# Patient Record
Sex: Male | Born: 1935 | Race: White | Hispanic: No | Marital: Single | Smoking: Never smoker
Health system: Southern US, Community
[De-identification: ages and names within clinical notes are randomized; demographics above are authoritative.]

## PROBLEM LIST (undated history)

## (undated) DIAGNOSIS — I10 Essential (primary) hypertension: Secondary | ICD-10-CM

## (undated) DIAGNOSIS — E119 Type 2 diabetes mellitus without complications: Secondary | ICD-10-CM

## (undated) HISTORY — PX: APPENDECTOMY: SHX54

---

## 2016-10-04 ENCOUNTER — Emergency Department
Admission: EM | Admit: 2016-10-04 | Discharge: 2016-10-04 | Disposition: A | Discharge status: Home / Self Care | Payer: Medicare Other | Attending: Emergency Medicine | Admitting: Emergency Medicine

## 2016-10-04 ENCOUNTER — Emergency Department: Payer: Medicare Other

## 2016-10-04 ENCOUNTER — Encounter: Payer: Self-pay | Admitting: Emergency Medicine

## 2016-10-04 DIAGNOSIS — Z7982 Long term (current) use of aspirin: Secondary | ICD-10-CM | POA: Diagnosis not present

## 2016-10-04 DIAGNOSIS — Z950 Presence of cardiac pacemaker: Secondary | ICD-10-CM | POA: Diagnosis not present

## 2016-10-04 DIAGNOSIS — I2510 Atherosclerotic heart disease of native coronary artery without angina pectoris: Secondary | ICD-10-CM | POA: Diagnosis not present

## 2016-10-04 DIAGNOSIS — F0390 Unspecified dementia without behavioral disturbance: Secondary | ICD-10-CM | POA: Insufficient documentation

## 2016-10-04 DIAGNOSIS — R410 Disorientation, unspecified: Secondary | ICD-10-CM | POA: Insufficient documentation

## 2016-10-04 DIAGNOSIS — E1165 Type 2 diabetes mellitus with hyperglycemia: Secondary | ICD-10-CM | POA: Insufficient documentation

## 2016-10-04 DIAGNOSIS — R4182 Altered mental status, unspecified: Secondary | ICD-10-CM | POA: Diagnosis present

## 2016-10-04 DIAGNOSIS — R739 Hyperglycemia, unspecified: Secondary | ICD-10-CM

## 2016-10-04 DIAGNOSIS — Z79899 Other long term (current) drug therapy: Secondary | ICD-10-CM | POA: Insufficient documentation

## 2016-10-04 DIAGNOSIS — E876 Hypokalemia: Secondary | ICD-10-CM

## 2016-10-04 HISTORY — DX: Type 2 diabetes mellitus without complications: E11.9

## 2016-10-04 HISTORY — DX: Essential (primary) hypertension: I10

## 2016-10-04 LAB — COMPREHENSIVE METABOLIC PANEL
ALT: 18 U/L (ref 17–63)
ANION GAP: 7 (ref 5–15)
AST: 21 U/L (ref 15–41)
Albumin: 3.7 g/dL (ref 3.5–5.0)
Alkaline Phosphatase: 69 U/L (ref 38–126)
BUN: 17 mg/dL (ref 6–20)
CHLORIDE: 98 mmol/L — AB (ref 101–111)
CO2: 25 mmol/L (ref 22–32)
CREATININE: 1.39 mg/dL — AB (ref 0.61–1.24)
Calcium: 8.4 mg/dL — ABNORMAL LOW (ref 8.9–10.3)
GFR calc non Af Amer: 46 mL/min — ABNORMAL LOW (ref >60)
GFR, EST AFRICAN AMERICAN: 54 mL/min — AB (ref >60)
Glucose, Bld: 318 mg/dL — ABNORMAL HIGH (ref 65–99)
POTASSIUM: 3.2 mmol/L — AB (ref 3.5–5.1)
SODIUM: 130 mmol/L — AB (ref 135–145)
Total Bilirubin: 1.1 mg/dL (ref 0.3–1.2)
Total Protein: 6.4 g/dL — ABNORMAL LOW (ref 6.5–8.1)

## 2016-10-04 LAB — CBC WITH DIFFERENTIAL/PLATELET
Basophils Absolute: 0.1 10*3/uL (ref 0–0.1)
Basophils Relative: 1 %
EOS ABS: 0.2 10*3/uL (ref 0–0.7)
EOS PCT: 2 %
HCT: 33.5 % — ABNORMAL LOW (ref 40.0–52.0)
Hemoglobin: 11.8 g/dL — ABNORMAL LOW (ref 13.0–18.0)
LYMPHS ABS: 0.6 10*3/uL — AB (ref 1.0–3.6)
Lymphocytes Relative: 6 %
MCH: 30.4 pg (ref 26.0–34.0)
MCHC: 35.3 g/dL (ref 32.0–36.0)
MCV: 86.1 fL (ref 80.0–100.0)
MONO ABS: 0.6 10*3/uL (ref 0.2–1.0)
Monocytes Relative: 6 %
Neutro Abs: 8.4 10*3/uL — ABNORMAL HIGH (ref 1.4–6.5)
Neutrophils Relative %: 85 %
PLATELETS: 150 10*3/uL (ref 150–440)
RBC: 3.89 MIL/uL — AB (ref 4.40–5.90)
RDW: 13.9 % (ref 11.5–14.5)
WBC: 9.9 10*3/uL (ref 3.8–10.6)

## 2016-10-04 LAB — URINALYSIS, COMPLETE (UACMP) WITH MICROSCOPIC
BILIRUBIN URINE: NEGATIVE
Bacteria, UA: NONE SEEN
Glucose, UA: >1000 mg/dL — AB
LEUKOCYTES UA: NEGATIVE
NITRITE: NEGATIVE
RBC / HPF: NONE SEEN RBC/hpf (ref 0–5)
SQUAMOUS EPITHELIAL / LPF: NONE SEEN
Specific Gravity, Urine: 1.015 (ref 1.005–1.030)
pH: 5.5 (ref 5.0–8.0)

## 2016-10-04 LAB — TROPONIN I
TROPONIN I: 0.04 ng/mL — AB (ref <0.03)
TROPONIN I: 0.05 ng/mL — AB (ref <0.03)

## 2016-10-04 LAB — GLUCOSE, CAPILLARY: Glucose-Capillary: 273 mg/dL — ABNORMAL HIGH (ref 65–99)

## 2016-10-04 MED ORDER — POTASSIUM CHLORIDE CRYS ER 20 MEQ PO TBCR
40.0000 meq | EXTENDED_RELEASE_TABLET | Freq: Once | ORAL | Status: AC
  Administered 2016-10-04: 40 meq via ORAL
  Filled 2016-10-04: qty 2

## 2016-10-04 MED ORDER — SODIUM CHLORIDE 0.9 % IV BOLUS (SEPSIS)
500.0000 mL | Freq: Once | INTRAVENOUS | Status: AC
  Administered 2016-10-04: 500 mL via INTRAVENOUS

## 2016-10-04 NOTE — ED Triage Notes (Addendum)
Per brother pt more confused than normal this am when he woke up. Pt with dementia, alert to self and place. Pt denies any feelings of confusion. Brother states has been urinating very frequently. No slurred speech, no facial droop noted, equal hand grips. Confusion noted upon waking around 6am.

## 2016-10-04 NOTE — ED Notes (Signed)
Pt resting in bed, family at bedside, warm blanket given 

## 2016-10-04 NOTE — ED Notes (Signed)
Dr. Veronese notified of critical trop 

## 2016-10-04 NOTE — ED Notes (Signed)
Pt resting in bed, denies any needs, resp even and unlabored 

## 2016-10-04 NOTE — ED Provider Notes (Signed)
[facility] Emergency Department Provider Note  ____________________________________________  Time seen: Approximately 11:02 AM  I have reviewed the triage vital signs and the nursing notes.   HISTORY  Chief Complaint Altered Mental Status   HPI Jesus [name] is a 82 y.o. male with a history of dementia, diabetes, hypertension, coronary artery disease, pacemaker who presents for evaluation of altered mental status. Patient is here with his brother who said the patient was a little bit more confused than normal this morning. He was rambling about [place] and vegetables this morning. Had a little bit of trouble getting up from the couch. Patient has a history of dementia but this was a little bit worse than his baseline. No facial droop, difficulty finding words, unilateral weakness or numbness. According to the brother patient has been complaining of increased urination for the last few days. Also patient has had a productive cough for the last 3 days. No fever or chills, normal appetite, no vomiting or diarrhea. Patient has no complaints at this time and denies headache, sore throat, congestion, shortness of breath, chest pain, abdominal pain, nausea  Past Medical History:  Diagnosis Date  . Diabetes mellitus without complication (HCC)   . Hypertension     There are no active problems to display for this patient.   Past Surgical History:  Procedure Laterality Date  . APPENDECTOMY      Prior to Admission medications   Medication Sig Start Date End Date Taking? Authorizing Provider  amLODipine (NORVASC) 10 MG tablet Take 10 mg by mouth daily.   Yes [provider]  aspirin 81 MG chewable tablet Chew 81 mg by mouth daily.   Yes [provider]  azelastine (ASTELIN) 0.1 % nasal spray Place 1 spray into both nostrils 2 (two) times daily. Use in each nostril as directed   Yes [provider]  donepezil (ARICEPT) 10 MG tablet Take  10 mg by mouth at bedtime.   Yes [provider]  glipiZIDE (GLUCOTROL) 10 MG tablet Take 20 mg by mouth 2 (two) times daily before a meal.   Yes [provider]  lisinopril (PRINIVIL,ZESTRIL) 40 MG tablet Take 40 mg by mouth daily.   Yes [provider]  Multiple Vitamins-Minerals (MULTIVITAMIN WITH MINERALS) tablet Take 1 tablet by mouth daily.   Yes [provider]  omega-3 acid ethyl esters (LOVAZA) 1 g capsule Take 2 g by mouth daily.   Yes [provider]  Potassium 99 MG TABS Take 1 tablet by mouth daily.   Yes [provider]  tamsulosin (FLOMAX) 0.4 MG CAPS capsule Take 0.8 mg by mouth at bedtime.   Yes [provider]    Allergies Patient has no known allergies.  No family history on file.  Social History Social History  Substance Use Topics  . Smoking status: Never Smoker  . Smokeless tobacco: Never Used  . Alcohol use No    Review of Systems  Constitutional: Negative for fever. + confusion Eyes: Negative for visual changes. ENT: Negative for sore throat. Neck: No neck pain  Cardiovascular: Negative for chest pain. Respiratory: Negative for shortness of breath. + cough Gastrointestinal: Negative for abdominal pain, vomiting or diarrhea. Genitourinary: Negative for dysuria. + frequency Musculoskeletal: Negative for back pain. Skin: Negative for rash. Neurological: Negative for headaches, weakness or numbness. Psych: No SI or HI  ____________________________________________   PHYSICAL EXAM:  VITAL SIGNS: ED Triage Vitals [10/04/16 0956]  Enc Vitals Group  BP (!) 152/51     Pulse Rate 77     Resp 20     Temp 99.4 F (37.4 C)     Temp Source Oral     SpO2 97 %     Weight 165 lb (74.8 kg)     Height      Head Circumference      Peak Flow      Pain Score      Pain Loc      Pain Edu?      Excl. in GC?     Constitutional: Alert and oriented x2. Well appearing and in no apparent  distress. HEENT:      Head: Normocephalic and atraumatic.         Eyes: Conjunctivae are normal. Sclera is non-icteric.       Mouth/Throat: Mucous membranes are moist.       Neck: Supple with no signs of meningismus. Cardiovascular: Regular rate and rhythm. No murmurs, gallops, or rubs. 2+ symmetrical distal pulses are present in all extremities. No JVD. Respiratory: Normal respiratory effort. Lungs are clear to auscultation bilaterally. No wheezes, crackles, or rhonchi.  Gastrointestinal: Soft, non tender, and non distended with positive bowel sounds. No rebound or guarding. Genitourinary: No CVA tenderness. Musculoskeletal: Nontender with normal range of motion in all extremities. No edema, cyanosis, or erythema of extremities. Neurologic: Normal speech and language. A & O x3, PERRL, no nystagmus, CN II-XII intact, motor testing reveals good tone and bulk throughout. There is no evidence of pronator drift or dysmetria. Muscle strength is 5/5 throughout. Sensory examination is intact. Gait deferred Skin: Skin is warm, dry and intact. No rash noted. Psychiatric: Mood and affect are normal. Speech and behavior are normal.  ____________________________________________   LABS (all labs ordered are listed, but only abnormal results are displayed)  Labs Reviewed  CBC WITH DIFFERENTIAL/PLATELET - Abnormal; Notable for the following:       Result Value   RBC 3.89 (*)    Hemoglobin 11.8 (*)    HCT 33.5 (*)    Neutro Abs 8.4 (*)    Lymphs Abs 0.6 (*)    All other components within normal limits  COMPREHENSIVE METABOLIC PANEL - Abnormal; Notable for the following:    Sodium 130 (*)    Potassium 3.2 (*)    Chloride 98 (*)    Glucose, Bld 318 (*)    Creatinine, Ser 1.39 (*)    Calcium 8.4 (*)    Total Protein 6.4 (*)    GFR calc non Af Amer 46 (*)    GFR calc Af Amer 54 (*)    All other components within normal limits  URINALYSIS, COMPLETE (UACMP) WITH MICROSCOPIC - Abnormal; Notable for  the following:    Glucose, UA >1000 (*)    Hgb urine dipstick TRACE (*)    Ketones, ur TRACE (*)    Protein, ur TRACE (*)    All other components within normal limits  TROPONIN I - Abnormal; Notable for the following:    Troponin I 0.04 (*)    All other components within normal limits  TROPONIN I - Abnormal; Notable for the following:    Troponin I 0.05 (*)    All other components within normal limits  GLUCOSE, CAPILLARY - Abnormal; Notable for the following:    Glucose-Capillary 273 (*)    All other components within normal limits  URINE CULTURE  CBG MONITORING, ED   ____________________________________________  EKG  ED ECG REPORT  I, Nita Sickle, the attending physician, personally viewed and interpreted this ECG.  Atrial sensed ventricular paced rhythm, rate of 68, normal QTC, left axis deviation, discordant ST elevations in inferior leads. No prior for comparison ____________________________________________  RADIOLOGY  CXR; No active cardiopulmonary disease. ____________________________________________   PROCEDURES  Procedure(s) performed: None Procedures Critical Care performed:  None ____________________________________________   INITIAL IMPRESSION / ASSESSMENT AND PLAN / ED COURSE  82 y.o. male with a history of dementia, diabetes, hypertension, coronary artery disease, pacemaker who presents for evaluation of mild confusion this morning the setting of a few days of productive cough and increased urinary frequency. Patient is alert and oriented 2 and according to his brother back to his baseline. Has a low-grade temp of 99.50F, vitals are otherwise within normal limits. Patient is neurologically intact with no evidence of stroke. We'll check basic blood work to rule out electrolyte abnormalities, dehydration, urinary tract infection, we'll get a chest x-ray to rule out pneumonia. We'll monitor patient on telemetry for any signs of arrhythmia.  Clinical Course  as of Oct 04 1501  Wed Oct 04, 2016  1445 EKG paced. Troponin stable after 3 hour observation. There is no prior EKG or troponin done for patient here or in the system. Creatinine is at baseline. Patient remains at baseline with no complaints. Brother says patient never complained of CP or SOB. Dicuss EKG findings, history, and lab work with Dr. Darrold Junker, patient's cardiologist who recommended discharge home with close f/u with him tomorrow or Friday at his clinic. BG trending down with IVF. Patient remains with no complaints. Will dc home at this time. I scheduled an appointment with Dr. Darrold Junker for 6/22 AT 10:45 am. Brother will bring patient back to the emergency room if he gets confused again or complaints of chest pain or shortness of breath.  [CV]    Clinical Course User Index [CV] Nita Sickle, MD    Pertinent labs & imaging results that were available during my care of the patient were reviewed by me and considered in my medical decision making (see chart for details).    ____________________________________________   FINAL CLINICAL IMPRESSION(S) / ED DIAGNOSES  Final diagnoses:  Confusion  Hyperglycemia without ketosis  Hypokalemia      NEW MEDICATIONS STARTED DURING THIS VISIT:  New Prescriptions   No medications on file     Note:  This document was prepared using Dragon voice recognition software and may include unintentional dictation errors.    Don Perking, [place], MD 10/04/16 [phone]

## 2016-10-04 NOTE — ED Notes (Signed)
Pt resting in bed, family at bedside, aware of plan to redraw blood work, pt denies any needs, pt states "I am going to take a nap and wait"

## 2016-10-04 NOTE — Discharge Instructions (Signed)
Please follow up with Dr. Darrold JunkerParaschos on Friday at 10:45 AM. Return to the emergency room if patient is confused, has chest pain, shortness of breath, feels like he is going to pass out, or any new symptoms present.

## 2016-10-05 LAB — URINE CULTURE: CULTURE: NO GROWTH

## 2017-10-22 ENCOUNTER — Emergency Department: Payer: Medicare Other

## 2017-10-22 ENCOUNTER — Inpatient Hospital Stay
Admission: EM | Admit: 2017-10-22 | Discharge: 2017-10-29 | DRG: 280 | Disposition: A | Discharge status: Home Health | Payer: Medicare Other | Attending: Specialist | Admitting: Specialist

## 2017-10-22 ENCOUNTER — Encounter: Payer: Self-pay | Admitting: *Deleted

## 2017-10-22 ENCOUNTER — Other Ambulatory Visit: Payer: Self-pay

## 2017-10-22 DIAGNOSIS — J9690 Respiratory failure, unspecified, unspecified whether with hypoxia or hypercapnia: Secondary | ICD-10-CM

## 2017-10-22 DIAGNOSIS — R3915 Urgency of urination: Secondary | ICD-10-CM | POA: Diagnosis present

## 2017-10-22 DIAGNOSIS — I2510 Atherosclerotic heart disease of native coronary artery without angina pectoris: Secondary | ICD-10-CM | POA: Diagnosis present

## 2017-10-22 DIAGNOSIS — I5031 Acute diastolic (congestive) heart failure: Secondary | ICD-10-CM | POA: Diagnosis not present

## 2017-10-22 DIAGNOSIS — E785 Hyperlipidemia, unspecified: Secondary | ICD-10-CM | POA: Diagnosis present

## 2017-10-22 DIAGNOSIS — J9601 Acute respiratory failure with hypoxia: Secondary | ICD-10-CM | POA: Diagnosis not present

## 2017-10-22 DIAGNOSIS — I214 Non-ST elevation (NSTEMI) myocardial infarction: Principal | ICD-10-CM | POA: Diagnosis present

## 2017-10-22 DIAGNOSIS — Z955 Presence of coronary angioplasty implant and graft: Secondary | ICD-10-CM

## 2017-10-22 DIAGNOSIS — J189 Pneumonia, unspecified organism: Secondary | ICD-10-CM | POA: Diagnosis not present

## 2017-10-22 DIAGNOSIS — N179 Acute kidney failure, unspecified: Secondary | ICD-10-CM | POA: Diagnosis present

## 2017-10-22 DIAGNOSIS — Z7982 Long term (current) use of aspirin: Secondary | ICD-10-CM | POA: Diagnosis not present

## 2017-10-22 DIAGNOSIS — I130 Hypertensive heart and chronic kidney disease with heart failure and stage 1 through stage 4 chronic kidney disease, or unspecified chronic kidney disease: Secondary | ICD-10-CM | POA: Diagnosis not present

## 2017-10-22 DIAGNOSIS — N401 Enlarged prostate with lower urinary tract symptoms: Secondary | ICD-10-CM | POA: Diagnosis present

## 2017-10-22 DIAGNOSIS — J181 Lobar pneumonia, unspecified organism: Secondary | ICD-10-CM | POA: Diagnosis present

## 2017-10-22 DIAGNOSIS — E1165 Type 2 diabetes mellitus with hyperglycemia: Secondary | ICD-10-CM | POA: Diagnosis present

## 2017-10-22 DIAGNOSIS — E860 Dehydration: Secondary | ICD-10-CM | POA: Diagnosis present

## 2017-10-22 DIAGNOSIS — E1122 Type 2 diabetes mellitus with diabetic chronic kidney disease: Secondary | ICD-10-CM | POA: Diagnosis present

## 2017-10-22 DIAGNOSIS — R140 Abdominal distension (gaseous): Secondary | ICD-10-CM

## 2017-10-22 DIAGNOSIS — E119 Type 2 diabetes mellitus without complications: Secondary | ICD-10-CM

## 2017-10-22 DIAGNOSIS — Z7984 Long term (current) use of oral hypoglycemic drugs: Secondary | ICD-10-CM | POA: Diagnosis not present

## 2017-10-22 DIAGNOSIS — Z79899 Other long term (current) drug therapy: Secondary | ICD-10-CM

## 2017-10-22 DIAGNOSIS — E876 Hypokalemia: Secondary | ICD-10-CM | POA: Diagnosis not present

## 2017-10-22 DIAGNOSIS — N419 Inflammatory disease of prostate, unspecified: Secondary | ICD-10-CM | POA: Diagnosis present

## 2017-10-22 DIAGNOSIS — J9621 Acute and chronic respiratory failure with hypoxia: Secondary | ICD-10-CM | POA: Diagnosis not present

## 2017-10-22 DIAGNOSIS — F0390 Unspecified dementia without behavioral disturbance: Secondary | ICD-10-CM | POA: Diagnosis present

## 2017-10-22 DIAGNOSIS — R531 Weakness: Secondary | ICD-10-CM | POA: Diagnosis present

## 2017-10-22 DIAGNOSIS — I249 Acute ischemic heart disease, unspecified: Secondary | ICD-10-CM

## 2017-10-22 DIAGNOSIS — N183 Chronic kidney disease, stage 3 (moderate): Secondary | ICD-10-CM | POA: Diagnosis present

## 2017-10-22 DIAGNOSIS — Z950 Presence of cardiac pacemaker: Secondary | ICD-10-CM

## 2017-10-22 DIAGNOSIS — Z9049 Acquired absence of other specified parts of digestive tract: Secondary | ICD-10-CM

## 2017-10-22 DIAGNOSIS — R0602 Shortness of breath: Secondary | ICD-10-CM

## 2017-10-22 LAB — URINALYSIS, COMPLETE (UACMP) WITH MICROSCOPIC
BILIRUBIN URINE: NEGATIVE
GLUCOSE, UA: 50 mg/dL — AB
KETONES UR: 5 mg/dL — AB
Nitrite: NEGATIVE
PROTEIN: 30 mg/dL — AB
Specific Gravity, Urine: 1.016 (ref 1.005–1.030)
WBC, UA: >50 WBC/hpf — ABNORMAL HIGH (ref 0–5)
pH: 5 (ref 5.0–8.0)

## 2017-10-22 LAB — CBC
HEMATOCRIT: 31.2 % — AB (ref 40.0–52.0)
HEMOGLOBIN: 11.3 g/dL — AB (ref 13.0–18.0)
MCH: 31.5 pg (ref 26.0–34.0)
MCHC: 36.3 g/dL — ABNORMAL HIGH (ref 32.0–36.0)
MCV: 86.8 fL (ref 80.0–100.0)
Platelets: 148 10*3/uL — ABNORMAL LOW (ref 150–440)
RBC: 3.59 MIL/uL — AB (ref 4.40–5.90)
RDW: 14.2 % (ref 11.5–14.5)
WBC: 15.1 10*3/uL — ABNORMAL HIGH (ref 3.8–10.6)

## 2017-10-22 LAB — LACTIC ACID, PLASMA: LACTIC ACID, VENOUS: 1.8 mmol/L (ref 0.5–1.9)

## 2017-10-22 LAB — BASIC METABOLIC PANEL
ANION GAP: 12 (ref 5–15)
BUN: 33 mg/dL — ABNORMAL HIGH (ref 8–23)
CALCIUM: 8.7 mg/dL — AB (ref 8.9–10.3)
CO2: 23 mmol/L (ref 22–32)
Chloride: 95 mmol/L — ABNORMAL LOW (ref 98–111)
Creatinine, Ser: 1.54 mg/dL — ABNORMAL HIGH (ref 0.61–1.24)
GFR, EST AFRICAN AMERICAN: 47 mL/min — AB (ref >60)
GFR, EST NON AFRICAN AMERICAN: 41 mL/min — AB (ref >60)
Glucose, Bld: 294 mg/dL — ABNORMAL HIGH (ref 70–99)
POTASSIUM: 3.5 mmol/L (ref 3.5–5.1)
Sodium: 130 mmol/L — ABNORMAL LOW (ref 135–145)

## 2017-10-22 LAB — PROTIME-INR
INR: 1.03
Prothrombin Time: 13.4 seconds (ref 11.4–15.2)

## 2017-10-22 LAB — APTT: APTT: 34 s (ref 24–36)

## 2017-10-22 LAB — TROPONIN I: TROPONIN I: 5.71 ng/mL — AB (ref <0.03)

## 2017-10-22 MED ORDER — ASPIRIN 81 MG PO CHEW
81.0000 mg | CHEWABLE_TABLET | Freq: Every day | ORAL | Status: DC
  Administered 2017-10-23 – 2017-10-29 (×7): 81 mg via ORAL
  Filled 2017-10-22 (×7): qty 1

## 2017-10-22 MED ORDER — LISINOPRIL 20 MG PO TABS
40.0000 mg | ORAL_TABLET | Freq: Every day | ORAL | Status: DC
  Administered 2017-10-23 – 2017-10-29 (×7): 40 mg via ORAL
  Filled 2017-10-22 (×7): qty 2

## 2017-10-22 MED ORDER — INSULIN ASPART 100 UNIT/ML ~~LOC~~ SOLN
0.0000 [IU] | Freq: Three times a day (TID) | SUBCUTANEOUS | Status: DC
  Administered 2017-10-23: 3 [IU] via SUBCUTANEOUS
  Administered 2017-10-23: 2 [IU] via SUBCUTANEOUS
  Administered 2017-10-23: 3 [IU] via SUBCUTANEOUS
  Administered 2017-10-24 (×2): 7 [IU] via SUBCUTANEOUS
  Administered 2017-10-24: 3 [IU] via SUBCUTANEOUS
  Administered 2017-10-25: 5 [IU] via SUBCUTANEOUS
  Administered 2017-10-25: 7 [IU] via SUBCUTANEOUS
  Administered 2017-10-25: 9 [IU] via SUBCUTANEOUS
  Administered 2017-10-26: 3 [IU] via SUBCUTANEOUS
  Administered 2017-10-26: 2 [IU] via SUBCUTANEOUS
  Administered 2017-10-26 – 2017-10-27 (×2): 5 [IU] via SUBCUTANEOUS
  Administered 2017-10-27: 3 [IU] via SUBCUTANEOUS
  Administered 2017-10-27 – 2017-10-28 (×2): 5 [IU] via SUBCUTANEOUS
  Administered 2017-10-28: 3 [IU] via SUBCUTANEOUS
  Filled 2017-10-22 (×15): qty 1

## 2017-10-22 MED ORDER — HEPARIN (PORCINE) IN NACL 100-0.45 UNIT/ML-% IJ SOLN
1200.0000 [IU]/h | INTRAMUSCULAR | Status: DC
  Administered 2017-10-22: 900 [IU]/h via INTRAVENOUS
  Filled 2017-10-22: qty 250

## 2017-10-22 MED ORDER — SODIUM CHLORIDE 0.9 % IV SOLN
INTRAVENOUS | Status: DC
  Administered 2017-10-23: via INTRAVENOUS

## 2017-10-22 MED ORDER — ONDANSETRON HCL 4 MG PO TABS: 4.0000 mg | ORAL_TABLET | Freq: Four times a day (QID) | ORAL | Status: DC | PRN

## 2017-10-22 MED ORDER — INSULIN ASPART 100 UNIT/ML ~~LOC~~ SOLN
0.0000 [IU] | Freq: Every day | SUBCUTANEOUS | Status: DC
  Administered 2017-10-23: 2 [IU] via SUBCUTANEOUS
  Administered 2017-10-23: 4 [IU] via SUBCUTANEOUS
  Administered 2017-10-24: 3 [IU] via SUBCUTANEOUS
  Administered 2017-10-25: 4 [IU] via SUBCUTANEOUS
  Administered 2017-10-26 – 2017-10-27 (×2): 2 [IU] via SUBCUTANEOUS
  Administered 2017-10-28: 4 [IU] via SUBCUTANEOUS
  Filled 2017-10-22 (×8): qty 1

## 2017-10-22 MED ORDER — DOCUSATE SODIUM 100 MG PO CAPS
100.0000 mg | ORAL_CAPSULE | Freq: Two times a day (BID) | ORAL | Status: DC
  Administered 2017-10-23 – 2017-10-29 (×12): 100 mg via ORAL
  Filled 2017-10-22 (×12): qty 1

## 2017-10-22 MED ORDER — MEMANTINE HCL 5 MG PO TABS
5.0000 mg | ORAL_TABLET | Freq: Two times a day (BID) | ORAL | Status: DC
  Administered 2017-10-22 – 2017-10-29 (×14): 5 mg via ORAL
  Filled 2017-10-22 (×16): qty 1

## 2017-10-22 MED ORDER — HEPARIN (PORCINE) IN NACL 100-0.45 UNIT/ML-% IJ SOLN: 10.0000 [IU]/kg/h | Freq: Once | INTRAMUSCULAR | Status: DC

## 2017-10-22 MED ORDER — AMLODIPINE BESYLATE 10 MG PO TABS
10.0000 mg | ORAL_TABLET | Freq: Every day | ORAL | Status: DC
  Administered 2017-10-23 – 2017-10-29 (×7): 10 mg via ORAL
  Filled 2017-10-22 (×7): qty 1

## 2017-10-22 MED ORDER — AZELASTINE HCL 0.1 % NA SOLN
1.0000 | Freq: Two times a day (BID) | NASAL | Status: DC | PRN
  Filled 2017-10-22: qty 30

## 2017-10-22 MED ORDER — DONEPEZIL HCL 5 MG PO TABS
10.0000 mg | ORAL_TABLET | Freq: Every day | ORAL | Status: DC
  Administered 2017-10-23 – 2017-10-28 (×6): 10 mg via ORAL
  Filled 2017-10-22 (×9): qty 2

## 2017-10-22 MED ORDER — ACETAMINOPHEN 650 MG RE SUPP: 650.0000 mg | Freq: Four times a day (QID) | RECTAL | Status: DC | PRN

## 2017-10-22 MED ORDER — TAMSULOSIN HCL 0.4 MG PO CAPS
0.8000 mg | ORAL_CAPSULE | Freq: Every day | ORAL | Status: DC
  Administered 2017-10-22 – 2017-10-28 (×7): 0.8 mg via ORAL
  Filled 2017-10-22 (×8): qty 2

## 2017-10-22 MED ORDER — BISACODYL 5 MG PO TBEC: 5.0000 mg | DELAYED_RELEASE_TABLET | Freq: Every day | ORAL | Status: DC | PRN

## 2017-10-22 MED ORDER — SODIUM CHLORIDE 0.9 % IV BOLUS
1000.0000 mL | Freq: Once | INTRAVENOUS | Status: AC
  Administered 2017-10-22: 1000 mL via INTRAVENOUS

## 2017-10-22 MED ORDER — ADULT MULTIVITAMIN W/MINERALS CH
1.0000 | ORAL_TABLET | Freq: Every day | ORAL | Status: DC
  Administered 2017-10-23 – 2017-10-29 (×7): 1 via ORAL
  Filled 2017-10-22 (×7): qty 1

## 2017-10-22 MED ORDER — ONDANSETRON HCL 4 MG/2ML IJ SOLN: 4.0000 mg | Freq: Four times a day (QID) | INTRAMUSCULAR | Status: DC | PRN

## 2017-10-22 MED ORDER — ACETAMINOPHEN 325 MG PO TABS
650.0000 mg | ORAL_TABLET | Freq: Four times a day (QID) | ORAL | Status: DC | PRN
  Administered 2017-10-23: 650 mg via ORAL
  Filled 2017-10-22: qty 2

## 2017-10-22 MED ORDER — HEPARIN BOLUS VIA INFUSION
4000.0000 [IU] | Freq: Once | INTRAVENOUS | Status: AC
  Administered 2017-10-22: 4000 [IU] via INTRAVENOUS
  Filled 2017-10-22: qty 4000

## 2017-10-22 MED ORDER — HYDROCODONE-ACETAMINOPHEN 5-325 MG PO TABS
1.0000 | ORAL_TABLET | ORAL | Status: DC | PRN
  Administered 2017-10-23: 1 via ORAL
  Filled 2017-10-22: qty 1

## 2017-10-22 MED ORDER — TRAZODONE HCL 50 MG PO TABS
25.0000 mg | ORAL_TABLET | Freq: Every evening | ORAL | Status: DC | PRN
  Administered 2017-10-22: 25 mg via ORAL
  Filled 2017-10-22: qty 1

## 2017-10-22 NOTE — ED Notes (Signed)
Pt unable to void at this time. 

## 2017-10-22 NOTE — ED Provider Notes (Signed)
[facility] Emergency Department Provider Note  ____________________________________________   None    (approximate)  I have reviewed the triage vital signs and the nursing notes.   HISTORY  Chief Complaint Weakness   HPI Jesus [name] is a 83 y.o. male who presents to the emergency department for treatment and evaluation of urinary frequency.  Patient was brought into the department by his brother who reports that he has not been eating or drinking very much over the past few days.  Patient has a history of hypertension and type 2 diabetes.  Past Medical History:  Diagnosis Date  . Diabetes mellitus without complication (HCC)   . Hypertension     There are no active problems to display for this patient.   Past Surgical History:  Procedure Laterality Date  . APPENDECTOMY      Prior to Admission medications   Medication Sig Start Date End Date Taking? Authorizing Provider  amLODipine (NORVASC) 10 MG tablet Take 10 mg by mouth daily.   Yes [provider]  aspirin 81 MG chewable tablet Chew 81 mg by mouth daily.   Yes [provider]  azelastine (ASTELIN) 0.1 % nasal spray Place 1 spray into both nostrils 2 (two) times daily. Use in each nostril as directed   Yes [provider]  donepezil (ARICEPT) 10 MG tablet Take 10 mg by mouth at bedtime.   Yes [provider]  glipiZIDE (GLUCOTROL) 10 MG tablet Take 20 mg by mouth 2 (two) times daily before a meal.   Yes [provider]  lisinopril (PRINIVIL,ZESTRIL) 40 MG tablet Take 40 mg by mouth daily.   Yes [provider]  memantine (NAMENDA) 5 MG tablet Take 5 mg by mouth 2 (two) times daily. 08/30/17  Yes [provider]  Multiple Vitamins-Minerals (MULTIVITAMIN WITH MINERALS) tablet Take 1 tablet by mouth daily.   Yes [provider]  omega-3 acid ethyl esters (LOVAZA) 1 g capsule Take 2 g by mouth daily.   Yes [provider]  pioglitazone (ACTOS) 30 MG tablet Take 30 mg by mouth daily. 10/11/17  Yes [provider]  Potassium 99 MG TABS Take 1 tablet by mouth daily.   Yes [provider]  tamsulosin (FLOMAX) 0.4 MG CAPS capsule Take 0.8 mg by mouth at bedtime.   Yes [provider]    Allergies Patient has no known allergies.  No family history on file.  Social History Social History   Tobacco Use  . Smoking status: Never Smoker  . Smokeless tobacco: Never Used  Substance Use Topics  . Alcohol use: No  . Drug use: No    Review of Systems per Caretaker.  Constitutional: No fever/chills. Positive for increase in generalized weakness and worsening dementia. Eyes: No visual changes. ENT: No sore throat. Cardiovascular: Denies chest pain. Respiratory: Denies shortness of breath. Gastrointestinal: No abdominal pain.  No nausea, no vomiting.  Positive for diarrhea.  No constipation. Genitourinary: Negative for dysuria. Positive for urinary frequency. Musculoskeletal: Negative for back pain. Skin: Negative for rash. Neurological: Negative for headaches, focal weakness or numbness. ____________________________________________   PHYSICAL EXAM:  VITAL SIGNS: ED Triage Vitals  Enc Vitals Group     BP 10/22/17 1831 (!) 136/52     Pulse Rate 10/22/17 1831 94     Resp 10/22/17 1831 20     Temp 10/22/17 1831 98.7 F (37.1 C)     Temp Source 10/22/17 1831 Oral     SpO2  10/22/17 1831 96 %     Weight 10/22/17 1833 166 lb (75.3 kg)     Height 10/22/17 1833 5\' 7"  (1.702 m)     Head Circumference --      Peak Flow --      Pain Score --      Pain Loc --      Pain Edu? --      Excl. in GC? --     Constitutional: Alert and oriented. Ill appearing and in no acute distress. Eyes: Conjunctivae are normal. Head: Atraumatic. Nose: No congestion/rhinnorhea. Mouth/Throat: Mucous membranes are dry.  Oropharynx non-erythematous. Neck: No stridor.   Cardiovascular: Normal rate,  regular rhythm. Grossly normal heart sounds.  Good peripheral circulation. No pitting edema of the lower extremities. Respiratory: Normal respiratory effort.  No retractions. Lungs CTAB. Gastrointestinal: Soft and nontender. No distention. No abdominal bruits. No CVA tenderness. Musculoskeletal: No lower extremity tenderness nor edema.  No joint effusions. Neurologic:  Normal speech and language. No gross focal neurologic deficits are appreciated. No gait instability. Skin:  Skin is warm, dry and intact. No rash noted. Psychiatric: Mood and affect are normal. Speech and behavior are normal.  ____________________________________________   LABS (all labs ordered are listed, but only abnormal results are displayed)  Labs Reviewed  BASIC METABOLIC PANEL - Abnormal; Notable for the following components:      Result Value   Sodium 130 (*)    Chloride 95 (*)    Glucose, Bld 294 (*)    BUN 33 (*)    Creatinine, Ser 1.54 (*)    Calcium 8.7 (*)    GFR calc non Af Amer 41 (*)    GFR calc Af Amer 47 (*)    All other components within normal limits  CBC - Abnormal; Notable for the following components:   WBC 15.1 (*)    RBC 3.59 (*)    Hemoglobin 11.3 (*)    HCT 31.2 (*)    MCHC 36.3 (*)    Platelets 148 (*)    All other components within normal limits  TROPONIN I - Abnormal; Notable for the following components:   Troponin I 5.71 (*)    All other components within normal limits  URINALYSIS, COMPLETE (UACMP) WITH MICROSCOPIC - Abnormal; Notable for the following components:   Color, Urine YELLOW (*)    APPearance HAZY (*)    Glucose, UA 50 (*)    Hgb urine dipstick LARGE (*)    Ketones, ur 5 (*)    Protein, ur 30 (*)    Leukocytes, UA MODERATE (*)    RBC / HPF >50 (*)    WBC, UA >50 (*)    Bacteria, UA FEW (*)    All other components within normal limits  CULTURE, BLOOD (ROUTINE X 2)  CULTURE, BLOOD (ROUTINE X 2)  LACTIC ACID, PLASMA  LACTIC ACID, PLASMA  APTT  PROTIME-INR    TROPONIN I  HEPARIN LEVEL (UNFRACTIONATED)   ____________________________________________  EKG  Paced rhythm ____________________________________________  RADIOLOGY  ED MD interpretation: Opacity in the right lower lung field.  Official radiology report(s): Dg Chest 2 View  Result Date: 10/22/2017 CLINICAL DATA:  Weakness.  Not eating or drinking EXAM: CHEST - 2 VIEW COMPARISON:  10/04/2016 FINDINGS: Streaky opacity at the right base. No edema or effusion. Normal heart size. Mild aortic tortuosity. Dual-chamber pacer leads from the left in stable position. IMPRESSION: Possible bronchopneumonia at the right base. Electronically Signed   By: Kathrynn Ducking.D.  On: 10/22/2017 19:13    ____________________________________________   PROCEDURES  Procedure(s) performed: None  Procedures  Critical Care performed: No  ____________________________________________   INITIAL IMPRESSION / ASSESSMENT AND PLAN / ED COURSE  As part of my medical decision making, I reviewed the following data within the electronic MEDICAL RECORD NUMBER History obtained from family, Discussed with PCP Dr. Derrill KayGoodman and Notes from prior ED visits  83 year old male presenting to the emergency department with his brother who is also his caretaker who states that he has not been feeling well over the past several days.  He was evaluated by his primary care provider earlier today and is being treated for what the brother recalls as a "prostate infection."  He is only had one dose of the antibiotic that was prescribed.  He states that the doctor's office called regarding some lab studies and encouraged the brother to try and get him to drink more fluids.  The brother states that he was unsuccessful and that attempt and decided that he needed to come into the hospital to receive IV fluids.  Patient denies any discomfort, specifically chest pain or abdominal pain.  There is been no episodes of vomiting but the brother  states that there was 2 episodes of diarrhea earlier today.  The brother also states that he feels that the patient has had an increase in weight gain, but states that he has not noticed any pitting edema in the lower extremities.  He has not noticed any change in his breathing pattern or rate of breathing.  Patient offers little information regarding his current health status but does not disagree with anything that the brother stated.  ----------------------------------------- 9:45 PM on 10/22/2017 ----------------------------------------- Even after 1 L of IV fluids, the patient has been unable to provide a urine specimen.  In and out cath has been requested of the nursing staff.  ----------------------------------------- 10:00 PM on 10/22/2017 -----------------------------------------  Heparin per protocol ordered.  Patient and brother are aware that he will need to be admitted.  The patient's brother plans to go home to get his medications.  At this time, the patient continues to rest in the bed and denies chest pain.  ----------------------------------------- 10:17 PM on 10/22/2017 -----------------------------------------  Patient to be admitted.  Case was discussed with Dr. Daphane ShepherdMeyer who agrees to admit the patient.  CRITICAL CARE Performed by: Kem Boroughsari Zitlali Primm   Total critical care time: 30 minutes  Critical care time was exclusive of separately billable procedures and treating other patients.  Critical care was necessary to treat or prevent imminent or life-threatening deterioration.  Critical care was time spent personally by me on the following activities: development of treatment plan with patient and/or surrogate as well as nursing, discussions with consultants, evaluation of patient's response to treatment, examination of patient, obtaining history from patient or surrogate, ordering and performing treatments and interventions, ordering and review of laboratory studies, ordering  and review of radiographic studies, pulse oximetry and re-evaluation of patient's condition.    ____________________________________________   FINAL CLINICAL IMPRESSION(S) / ED DIAGNOSES  Final diagnoses:  Dehydration  AKI (acute kidney injury) (HCC)  ACS (acute coronary syndrome) (HCC)  Community acquired pneumonia of right lower lobe of lung [facility](HCC)     ED Discharge Orders    None       Note:  This document was prepared using Dragon voice recognition software and may include unintentional dictation errors.    Chinita Pesterriplett, Lynann Demetrius B, FNP 10/22/17 2226    Phineas SemenGoodman, [place], MD 10/22/17  2241  

## 2017-10-22 NOTE — Consult Note (Signed)
ANTICOAGULATION CONSULT NOTE - Initial Consult  Pharmacy Consult for Heparin Drip  Indication: chest pain/ACS  No Known Allergies  Patient Measurements: Height: 5\' 7"  (170.2 cm) Weight: 166 lb (75.3 kg) IBW/kg (Calculated) : 66.1  Vital Signs: Temp: 98.7 F (37.1 C) (07/08 1831) Temp Source: Oral (07/08 1831) BP: 146/99 (07/08 2130) Pulse Rate: 104 (07/08 2130)  Labs: Recent Labs    10/22/17 1834  HGB 11.3*  HCT 31.2*  PLT 148*  CREATININE 1.54*  TROPONINI 5.71*    Estimated Creatinine Clearance: 35.2 mL/min (A) (by C-G formula based on SCr of 1.54 mg/dL (H)).   Medical History: Past Medical History:  Diagnosis Date  . Diabetes mellitus without complication (HCC)   . Hypertension    Assessment: Pharmacy consulted for heparin drip dosing and monitoring for ACS in 83 yo patient with Troponin of 5.71 in ED.   Goal of Therapy:  Heparin level 0.3-0.7 units/ml Monitor platelets by anticoagulation protocol: Yes   Plan:  Baseline labs ordered Give 4000 units bolus x 1 Start heparin infusion at 900 units/hr Check anti-Xa level in 8 hours and daily while on heparin Continue to monitor H&H and platelets  Gardner CandleSheema M Yasheka Fossett, PharmD, BCPS Clinical Pharmacist 10/22/2017 9:50 PM

## 2017-10-22 NOTE — ED Notes (Signed)
Bladder scanned pt = 13ml

## 2017-10-22 NOTE — ED Triage Notes (Signed)
Pt brought in by brother via wheelchair.  Brother reports pt has not been eating or drinking much.  Pt has urinary frequency.  Pt denies any pain.  Pt alert.

## 2017-10-22 NOTE — ED Notes (Signed)
Pt brother stated that pt has been "feeling like he has to pee" for the past two days, a lot of frequency and urgency and only goes a little. Pt brother also states that pt has had diarrhea twice today. Pt has Hx of having two stents. Pt also has dementia. Brother at bedside

## 2017-10-22 NOTE — ED Notes (Signed)
Pt's O2 sats noted to be 90% on RA with audible wheezing; pt placed on 2L O2 via [place] with sats improving to 93%.

## 2017-10-23 ENCOUNTER — Other Ambulatory Visit: Payer: Self-pay

## 2017-10-23 ENCOUNTER — Encounter: Payer: Self-pay | Admitting: *Deleted

## 2017-10-23 LAB — HEPARIN LEVEL (UNFRACTIONATED): Heparin Unfractionated: 0.1 IU/mL — ABNORMAL LOW (ref 0.30–0.70)

## 2017-10-23 LAB — BASIC METABOLIC PANEL
ANION GAP: 12 (ref 5–15)
BUN: 30 mg/dL — ABNORMAL HIGH (ref 8–23)
CO2: 21 mmol/L — ABNORMAL LOW (ref 22–32)
Calcium: 7.8 mg/dL — ABNORMAL LOW (ref 8.9–10.3)
Chloride: 99 mmol/L (ref 98–111)
Creatinine, Ser: 1.42 mg/dL — ABNORMAL HIGH (ref 0.61–1.24)
GFR calc Af Amer: 52 mL/min — ABNORMAL LOW (ref >60)
GFR, EST NON AFRICAN AMERICAN: 45 mL/min — AB (ref >60)
GLUCOSE: 198 mg/dL — AB (ref 70–99)
Potassium: 3 mmol/L — ABNORMAL LOW (ref 3.5–5.1)
Sodium: 132 mmol/L — ABNORMAL LOW (ref 135–145)

## 2017-10-23 LAB — HEPATIC FUNCTION PANEL
ALK PHOS: 53 U/L (ref 38–126)
ALT: 24 U/L (ref 0–44)
AST: 54 U/L — ABNORMAL HIGH (ref 15–41)
Albumin: 2.9 g/dL — ABNORMAL LOW (ref 3.5–5.0)
BILIRUBIN INDIRECT: 0.6 mg/dL (ref 0.3–0.9)
BILIRUBIN TOTAL: 0.8 mg/dL (ref 0.3–1.2)
Bilirubin, Direct: 0.2 mg/dL (ref 0.0–0.2)
Total Protein: 5.7 g/dL — ABNORMAL LOW (ref 6.5–8.1)

## 2017-10-23 LAB — CBC
HEMATOCRIT: 28.4 % — AB (ref 40.0–52.0)
Hemoglobin: 10 g/dL — ABNORMAL LOW (ref 13.0–18.0)
MCH: 31.2 pg (ref 26.0–34.0)
MCHC: 35.3 g/dL (ref 32.0–36.0)
MCV: 88.4 fL (ref 80.0–100.0)
Platelets: 138 10*3/uL — ABNORMAL LOW (ref 150–440)
RBC: 3.22 MIL/uL — AB (ref 4.40–5.90)
RDW: 14 % (ref 11.5–14.5)
WBC: 13.3 10*3/uL — AB (ref 3.8–10.6)

## 2017-10-23 LAB — LIPID PANEL
CHOLESTEROL: 162 mg/dL (ref 0–200)
HDL: 42 mg/dL (ref >40)
LDL Cholesterol: 95 mg/dL (ref 0–99)
Total CHOL/HDL Ratio: 3.9 RATIO
Triglycerides: 126 mg/dL (ref <150)
VLDL: 25 mg/dL (ref 0–40)

## 2017-10-23 LAB — GLUCOSE, CAPILLARY
GLUCOSE-CAPILLARY: 175 mg/dL — AB (ref 70–99)
GLUCOSE-CAPILLARY: 221 mg/dL — AB (ref 70–99)
GLUCOSE-CAPILLARY: 216 mg/dL — AB (ref 70–99)
Glucose-Capillary: 247 mg/dL — ABNORMAL HIGH (ref 70–99)
Glucose-Capillary: 310 mg/dL — ABNORMAL HIGH (ref 70–99)

## 2017-10-23 LAB — TROPONIN I: Troponin I: 4.22 ng/mL (ref <0.03)

## 2017-10-23 MED ORDER — BUDESONIDE 0.5 MG/2ML IN SUSP
0.5000 mg | Freq: Two times a day (BID) | RESPIRATORY_TRACT | Status: DC
  Administered 2017-10-23 – 2017-10-25 (×5): 0.5 mg via RESPIRATORY_TRACT
  Filled 2017-10-23 (×7): qty 2

## 2017-10-23 MED ORDER — POTASSIUM CHLORIDE CRYS ER 20 MEQ PO TBCR
40.0000 meq | EXTENDED_RELEASE_TABLET | Freq: Once | ORAL | Status: AC
  Administered 2017-10-23: 40 meq via ORAL
  Filled 2017-10-23: qty 2

## 2017-10-23 MED ORDER — HEPARIN BOLUS VIA INFUSION
2200.0000 [IU] | Freq: Once | INTRAVENOUS | Status: AC
  Administered 2017-10-23: 2200 [IU] via INTRAVENOUS
  Filled 2017-10-23: qty 2200

## 2017-10-23 MED ORDER — SODIUM CHLORIDE 0.9 % IV SOLN
500.0000 mg | INTRAVENOUS | Status: DC
  Administered 2017-10-23 – 2017-10-25 (×3): 500 mg via INTRAVENOUS
  Filled 2017-10-23 (×3): qty 500

## 2017-10-23 MED ORDER — INSULIN GLARGINE 100 UNIT/ML ~~LOC~~ SOLN
5.0000 [IU] | SUBCUTANEOUS | Status: AC
  Administered 2017-10-23: 5 [IU] via SUBCUTANEOUS
  Filled 2017-10-23: qty 0.05

## 2017-10-23 MED ORDER — METOPROLOL SUCCINATE ER 25 MG PO TB24
25.0000 mg | ORAL_TABLET | Freq: Every day | ORAL | Status: DC
  Administered 2017-10-23 – 2017-10-29 (×7): 25 mg via ORAL
  Filled 2017-10-23 (×7): qty 1

## 2017-10-23 MED ORDER — IPRATROPIUM-ALBUTEROL 0.5-2.5 (3) MG/3ML IN SOLN
3.0000 mL | Freq: Four times a day (QID) | RESPIRATORY_TRACT | Status: DC
  Administered 2017-10-23 – 2017-10-26 (×11): 3 mL via RESPIRATORY_TRACT
  Filled 2017-10-23 (×13): qty 3

## 2017-10-23 MED ORDER — ATORVASTATIN CALCIUM 20 MG PO TABS
40.0000 mg | ORAL_TABLET | Freq: Every day | ORAL | Status: DC
  Administered 2017-10-23 – 2017-10-28 (×5): 40 mg via ORAL
  Filled 2017-10-23 (×5): qty 2

## 2017-10-23 MED ORDER — CEFTRIAXONE SODIUM 1 G IJ SOLR
1.0000 g | INTRAMUSCULAR | Status: DC
  Administered 2017-10-23 – 2017-10-28 (×6): 1 g via INTRAVENOUS
  Filled 2017-10-23 (×6): qty 1
  Filled 2017-10-23: qty 10

## 2017-10-23 MED ORDER — ENOXAPARIN SODIUM 40 MG/0.4ML ~~LOC~~ SOLN
40.0000 mg | SUBCUTANEOUS | Status: DC
  Administered 2017-10-23: 40 mg via SUBCUTANEOUS
  Filled 2017-10-23: qty 0.4

## 2017-10-23 NOTE — Progress Notes (Addendum)
Patient ID: Jesus [name] [place]ien[place]gos, male   DOB: 1934-11-01, 83 y.o.   MRN: [id number]  [facility] PROGRESS NOTE  Jesus [name] Mostafa WUJ:[id number] DOB: 1934-11-01 DOA: 10/22/2017 PCP: Leotis ShamesSingh, Jasmine, MD  HPI/Subjective: Patient lying on his right side and feels okay.  He lives with his brother.  Complaining of fatigue and not feeling well.  He has been having increased urination and feeling weak.  Also been coughing and wheezing.  He was found to have a high troponin.  He was being treated as outpatient for prostate infection.  Objective: Vitals:   10/23/17 0830 10/23/17 1404  BP: (!) 117/47   Pulse: 70   Resp: 18   Temp: 97.7 F (36.5 C)   SpO2: 90% (!) 87%   No intake or output data in the 24 hours ending 10/23/17 1427 Filed Weights   10/22/17 1833 10/22/17 2338 10/23/17 0500  Weight: 75.3 kg (166 lb) 74.3 kg (163 lb 11.2 oz) 74.3 kg (163 lb 11.2 oz)    ROS: Review of Systems  Constitutional: Positive for malaise/fatigue. Negative for chills and fever.  Eyes: Negative for blurred vision.  Respiratory: Positive for cough. Negative for shortness of breath.   Cardiovascular: Negative for chest pain.  Gastrointestinal: Negative for abdominal pain, constipation, diarrhea, nausea and vomiting.  Genitourinary: Positive for urgency. Negative for dysuria.  Musculoskeletal: Negative for joint pain.  Neurological: Negative for dizziness and headaches.   Exam: Physical Exam  Constitutional: He is oriented to person, place, and time.  HENT:  Nose: No mucosal edema.  Mouth/Throat: No oropharyngeal exudate or posterior oropharyngeal edema.  Eyes: Pupils are equal, round, and reactive to light. Conjunctivae and lids are normal.  Neck: No JVD present. Carotid bruit is not present. No edema present. No thyroid mass and no thyromegaly present.  Cardiovascular: S1 normal and S2 normal. Exam reveals no gallop.  No murmur heard. Pulses:      Dorsalis pedis pulses are 2+ on the right side, and 2+ on the  left side.  Respiratory: No respiratory distress. He has decreased breath sounds in the right lower field and the left lower field. He has no wheezes. He has no rhonchi. He has no rales.  GI: Soft. Bowel sounds are normal. There is no tenderness.  Musculoskeletal:       Right ankle: He exhibits no swelling.       Left ankle: He exhibits no swelling.  Lymphadenopathy:    He has no cervical adenopathy.  Neurological: He is alert and oriented to person, place, and time. No cranial nerve deficit.  Skin: Skin is warm. No rash noted. Nails show no clubbing.  Psychiatric: He has a normal mood and affect.      Data Reviewed: Basic Metabolic Panel: Recent Labs  Lab 10/22/17 1834 10/23/17 0531  NA 130* 132*  K 3.5 3.0*  CL 95* 99  CO2 23 21*  GLUCOSE 294* 198*  BUN 33* 30*  CREATININE 1.54* 1.42*  CALCIUM 8.7* 7.8*   Liver Function Tests: Recent Labs  Lab 10/23/17 0531  AST 54*  ALT 24  ALKPHOS 53  BILITOT 0.8  PROT 5.7*  ALBUMIN 2.9*   CBC: Recent Labs  Lab 10/22/17 1834 10/23/17 0531  WBC 15.1* 13.3*  HGB 11.3* 10.0*  HCT 31.2* 28.4*  MCV 86.8 88.4  PLT 148* 138*   Cardiac Enzymes: Recent Labs  Lab 10/22/17 1834 10/23/17 0048  TROPONINI 5.71* 4.22*    CBG: Recent Labs  Lab 10/23/17 0012 10/23/17 0821 10/23/17 1135  GLUCAP 310* 175* 221*    Recent Results (from the past 240 hour(s))  Blood culture (routine x 2)     Status: None (Preliminary result)   Collection Time: 10/22/17  8:36 PM  Result Value Ref Range Status   Specimen Description BLOOD RIGHT ANTECUBITAL  Final   Special Requests   Final    BOTTLES DRAWN AEROBIC AND ANAEROBIC Blood Culture adequate volume   Culture   Final    NO GROWTH < 12 HOURS Performed at [facility], [address] [place], [place] [address]    Report Status PENDING  Incomplete  Blood culture (routine x 2)     Status: None (Preliminary result)   Collection Time: 10/22/17  8:36 PM  Result Value Ref Range  Status   Specimen Description BLOOD RIGHT ANTECUBITAL  Final   Special Requests   Final    BOTTLES DRAWN AEROBIC AND ANAEROBIC Blood Culture adequate volume   Culture   Final    NO GROWTH < 12 HOURS Performed at [facility], [address] [place], [place] [address]    Report Status PENDING  Incomplete     Studies: Dg Chest 2 View  Result Date: 10/22/2017 CLINICAL DATA:  Weakness.  Not eating or drinking EXAM: CHEST - 2 VIEW COMPARISON:  10/04/2016 FINDINGS: Streaky opacity at the right base. No edema or effusion. Normal heart size. Mild aortic tortuosity. Dual-chamber pacer leads from the left in stable position. IMPRESSION: Possible bronchopneumonia at the right base. Electronically Signed   By: Marnee Spring M.D.   On: 10/22/2017 19:13    Scheduled Meds: . amLODipine  10 mg Oral Daily  . aspirin  81 mg Oral Daily  . atorvastatin  40 mg Oral q1800  . budesonide (PULMICORT) nebulizer solution  0.5 mg Nebulization BID  . docusate sodium  100 mg Oral BID  . donepezil  10 mg Oral QHS  . enoxaparin (LOVENOX) injection  40 mg Subcutaneous Q24H  . insulin aspart  0-5 Units Subcutaneous QHS  . insulin aspart  0-9 Units Subcutaneous TID WC  . ipratropium-albuterol  3 mL Nebulization Q6H  . lisinopril  40 mg Oral Daily  . memantine  5 mg Oral BID  . metoprolol succinate  25 mg Oral Daily  . multivitamin with minerals  1 tablet Oral Daily  . tamsulosin  0.8 mg Oral QHS   Continuous Infusions: . azithromycin Stopped (10/23/17 0535)  . cefTRIAXone (ROCEPHIN)  IV Stopped (10/23/17 0431)    Assessment/Plan:  1. Pneumonia, prostate infection.  On Rocephin and Zithromax.  Send off urine culture.  Nebulizer treatments started. 2. NSTEMI.  Start Toprol-XL and Lipitor.  Already on aspirin.  Seen by cardiology and heparin drip started.  Troponins are trending better.  Cardiology wanted to do conservative management.  Check lipid profile. 3. Essential hypertension on Norvasc,  Toprol, lisinopril 4. History of dementia without behavioral disturbance on Aricept 5. Type 2 diabetes mellitus.  On sliding scale insulin. 6. BPH on Flomax 7. Hypokalemia replace potassium orally 8. Chronic kidney disease stage III monitor closely.  Code Status:     Code Status Orders  (From admission, onward)        Start     Ordered   10/22/17 2335  Full code  Continuous     10/22/17 2334    Code Status History    This patient has a current code status but no historical code status.    Advance Directive Documentation  Most Recent Value  Type of Advance Directive  Healthcare Power of Attorney  Pre-existing out of facility DNR order (yellow form or pink MOST form)  -  "MOST" Form in Place?  -     Family Communication: Brother at the bedside Disposition Plan: To be determined based on clinical course  Consultants:  Cardiology  Antibiotics:  Rocephin  Zithromax  Time spent: 35 minutes including ACP time  [employer]

## 2017-10-23 NOTE — Progress Notes (Addendum)
ANTICOAGULATION CONSULT NOTE - Follow Up Consult  Pharmacy Consult for Heparin  Indication: chest pain/ACS  No Known Allergies  Patient Measurements: Height: 5\' 7"  (170.2 cm) Weight: 163 lb 11.2 oz (74.3 kg) IBW/kg (Calculated) : 66.1 Heparin Dosing Weight:   75.3 kg   Vital Signs: Temp: 98.6 F (37 C) (07/08 2338) Temp Source: Oral (07/08 2338) BP: 132/72 (07/08 2338) Pulse Rate: 92 (07/08 2338)  Labs: Recent Labs    10/22/17 1834 10/22/17 1835 10/23/17 0048 10/23/17 0531  HGB 11.3*  --   --  10.0*  HCT 31.2*  --   --  28.4*  PLT 148*  --   --  138*  APTT  --  34  --   --   LABPROT  --  13.4  --   --   INR  --  1.03  --   --   HEPARINUNFRC  --   --   --  0.10*  CREATININE 1.54*  --   --  1.42*  TROPONINI 5.71*  --  4.22*  --     Estimated Creatinine Clearance: 38.1 mL/min (A) (by C-G formula based on SCr of 1.42 mg/dL (H)).   Medications:  Medications Prior to Admission  Medication Sig Dispense Refill Last Dose  . amLODipine (NORVASC) 10 MG tablet Take 10 mg by mouth daily.   10/22/2017 at Unknown time  . aspirin 81 MG chewable tablet Chew 81 mg by mouth daily.   10/22/2017 at Unknown time  . azelastine (ASTELIN) 0.1 % nasal spray Place 1 spray into both nostrils 2 (two) times daily. Use in each nostril as directed   PRN at PRN  . donepezil (ARICEPT) 10 MG tablet Take 10 mg by mouth at bedtime.   10/21/2017 at Unknown time  . glipiZIDE (GLUCOTROL) 10 MG tablet Take 20 mg by mouth 2 (two) times daily before a meal.   10/22/2017 at Unknown time  . lisinopril (PRINIVIL,ZESTRIL) 40 MG tablet Take 40 mg by mouth daily.   10/22/2017 at Unknown time  . memantine (NAMENDA) 5 MG tablet Take 5 mg by mouth 2 (two) times daily.   10/22/2017 at Unknown time  . Multiple Vitamins-Minerals (MULTIVITAMIN WITH MINERALS) tablet Take 1 tablet by mouth daily.   10/22/2017 at Unknown time  . omega-3 acid ethyl esters (LOVAZA) 1 g capsule Take 2 g by mouth daily.   UNKNOWN at UNKNOWN  . pioglitazone  (ACTOS) 30 MG tablet Take 30 mg by mouth daily.   10/22/2017 at Unknown time  . Potassium 99 MG TABS Take 1 tablet by mouth daily.   10/22/2017 at Unknown time  . tamsulosin (FLOMAX) 0.4 MG CAPS capsule Take 0.8 mg by mouth at bedtime.   10/21/2017 at Unknown time    Assessment: CrCl = 38.1 ml/min  Goal of Therapy:  Heparin level 0.3-0.7 units/ml Monitor platelets by anticoagulation protocol: Yes   Plan:  7/9 :  HL @ 0530 = < 0.1  Will order heparin 2200 units IV X 1 bolus and increase drip rate to 1200 units/hr.  Will recheck HL 8 hrs after rate change.   Hattie Aguinaldo D 10/23/2017,7:19 AM

## 2017-10-23 NOTE — Progress Notes (Signed)
Will place on 2l [place] after neb treatment

## 2017-10-23 NOTE — Progress Notes (Signed)
Inpatient Diabetes Program Recommendations  AACE/ADA: New Consensus Statement on Inpatient Glycemic Control (2015)  Target Ranges:  Prepandial:   less than 140 mg/dL      Peak postprandial:   less than 180 mg/dL (1-2 hours)      Critically ill patients:  140 - 180 mg/dL   Lab Results  Component Value Date   GLUCAP 221 (H) 10/23/2017    Review of Glycemic ControlResults for Doris CheadleFELTS, Muhammadali (MRN [id number]) as of 10/23/2017 15:10  Ref. Range 10/23/2017 00:12 10/23/2017 08:21 10/23/2017 11:35  Glucose-Capillary Latest Ref Range: 70 - 99 mg/dL 409310 (H) 811175 (H) 914221 (H)    Diabetes history: Type 2 DM Outpatient Diabetes medications: Actos 30 mg daily, Glucotrol 20 mg bid Current orders for Inpatient glycemic control:  Novolog sensitive tid with meals and HS  Inpatient Diabetes Program Recommendations:   May consider adding Lantus 10 units daily while in the hospital.  Also please consider checking A1C.   Thanks,  Beryl MeagerJenny Zayden Maffei, RN, BC-ADM Inpatient Diabetes Coordinator Pager [phone] (8a-5p)

## 2017-10-23 NOTE — Progress Notes (Signed)
MD notified. Pt develops audible exp. Wheeze, oxygen sat 90% on room air. MD will place orders for nebs. I will continue to assess.

## 2017-10-23 NOTE — Progress Notes (Signed)
Patient arrived to 2A Room 246. Patient denies pain. Patient oriented to unit and use of call bell/room phone. Skin assessment completed with Yasmin RN and skin intact. A&Ox2, VSS, and Vpaced on verified tele-box #[id number]. Brother visited for short time to answer questions and provide password for patient. Nursing staff will continue to monitor for any changes in patient status. Lamonte RicherKara A Eldonna Neuenfeldt, RN

## 2017-10-23 NOTE — H&P (Signed)
[facility] -  at [facility]   PATIENT NAME: Jesus [name]    MR#:  [id number]  DATE OF BIRTH:  [birth year]  DATE OF ADMISSION:  10/22/2017  PRIMARY CARE PHYSICIAN: Leotis Shames, MD   REQUESTING/REFERRING PHYSICIAN:   CHIEF COMPLAINT:   Chief Complaint  Patient presents with  . Weakness    HISTORY OF PRESENT ILLNESS: Jesus [name]  is a 83 y.o. male with a known history of dementia, diabetes type 2 and hypertension. Patient was brought to emergency room for "not feeling well" with generalized weakness and poor appetite for the past, 2 to 3 days.  No fever or chills, no chest pain, shortness of breath, no N/V/D, no abdominal pain, no bleeding. Blood test done emergency room are remarkable for elevated creatinine level of 1.54, elevated WBC at 15.1, elevated blood sugar at 294.  Sodium level is 130.  Troponin level is elevated at 5.7. EKG shows paced rhythm. Chest x-ray is consistent with right lower lobe infiltrate.   PAST MEDICAL HISTORY:   Past Medical History:  Diagnosis Date  . Diabetes mellitus without complication (HCC)   . Hypertension     PAST SURGICAL HISTORY:  Past Surgical History:  Procedure Laterality Date  . APPENDECTOMY      SOCIAL HISTORY:  Social History   Tobacco Use  . Smoking status: Never Smoker  . Smokeless tobacco: Never Used  Substance Use Topics  . Alcohol use: No    FAMILY HISTORY: No family history on file.  DRUG ALLERGIES: No Known Allergies  REVIEW OF SYSTEMS:   CONSTITUTIONAL: No fever, but patient complains of fatigue and generalized weakness.  EYES: No blurred or double vision.  EARS, NOSE, AND THROAT: No tinnitus or ear pain.  RESPIRATORY: No cough, shortness of breath, wheezing or hemoptysis.  CARDIOVASCULAR: No chest pain, orthopnea, edema.  GASTROINTESTINAL: No nausea, vomiting, diarrhea or abdominal pain.  GENITOURINARY: No dysuria, hematuria.  ENDOCRINE: No polyuria, nocturia,  HEMATOLOGY:  No bleeding SKIN: No rash or lesion. MUSCULOSKELETAL: No joint pain or arthritis.   NEUROLOGIC: No focal weakness.  PSYCHIATRY: No anxiety or depression.   MEDICATIONS AT HOME:  Prior to Admission medications   Medication Sig Start Date End Date Taking? Authorizing Provider  amLODipine (NORVASC) 10 MG tablet Take 10 mg by mouth daily.   Yes [provider]  aspirin 81 MG chewable tablet Chew 81 mg by mouth daily.   Yes [provider]  azelastine (ASTELIN) 0.1 % nasal spray Place 1 spray into both nostrils 2 (two) times daily. Use in each nostril as directed   Yes [provider]  donepezil (ARICEPT) 10 MG tablet Take 10 mg by mouth at bedtime.   Yes [provider]  glipiZIDE (GLUCOTROL) 10 MG tablet Take 20 mg by mouth 2 (two) times daily before a meal.   Yes [provider]  lisinopril (PRINIVIL,ZESTRIL) 40 MG tablet Take 40 mg by mouth daily.   Yes [provider]  memantine (NAMENDA) 5 MG tablet Take 5 mg by mouth 2 (two) times daily. 08/30/17  Yes [provider]  Multiple Vitamins-Minerals (MULTIVITAMIN WITH MINERALS) tablet Take 1 tablet by mouth daily.   Yes [provider]  omega-3 acid ethyl esters (LOVAZA) 1 g capsule Take 2 g by mouth daily.   Yes [provider]  pioglitazone (ACTOS) 30 MG tablet Take 30 mg by mouth daily. 10/11/17  Yes [provider]  Potassium 99 MG TABS Take 1 tablet by mouth  daily.   Yes [provider]  tamsulosin (FLOMAX) 0.4 MG CAPS capsule Take 0.8 mg by mouth at bedtime.   Yes [provider]      PHYSICAL EXAMINATION:   VITAL SIGNS: Blood pressure 132/72, pulse 92, temperature 98.6 F (37 C), temperature source Oral, resp. rate (!) 22, height 5\' 7"  (1.702 m), weight 75.3 kg (166 lb), SpO2 92 %.  GENERAL:  83 y.o.-year-old patient lying in the bed with no acute distress.  EYES: Pupils equal, round, reactive to light and accommodation. No  scleral icterus. Extraocular muscles intact.  HEENT: Head atraumatic, normocephalic. Oropharynx and nasopharynx clear.  NECK:  Supple, no jugular venous distention. No thyroid enlargement, no tenderness.  LUNGS:  breath sounds bilaterally, no wheezing. No use of accessory muscles of respiration.  CARDIOVASCULAR: S1, S2 normal. No murmurs, rubs, or gallops.  ABDOMEN: Soft, nontender, nondistended. Bowel sounds present. No organomegaly or mass.  EXTREMITIES: No pedal edema, cyanosis, or clubbing.  NEUROLOGIC: Cranial nerves II through XII are intact. Muscle strength 5/5 in all extremities. Sensation intact. PSYCHIATRIC: The patient is alert and oriented x 3.  SKIN: No obvious rash, lesion, or ulcer.   LABORATORY PANEL:   CBC Recent Labs  Lab 10/22/17 1834  WBC 15.1*  HGB 11.3*  HCT 31.2*  PLT 148*  MCV 86.8  MCH 31.5  MCHC 36.3*  RDW 14.2   ------------------------------------------------------------------------------------------------------------------  Chemistries  Recent Labs  Lab 10/22/17 1834  NA 130*  K 3.5  CL 95*  CO2 23  GLUCOSE 294*  BUN 33*  CREATININE 1.54*  CALCIUM 8.7*   ------------------------------------------------------------------------------------------------------------------ estimated creatinine clearance is 35.2 mL/min (A) (by C-G formula based on SCr of 1.54 mg/dL (H)). ------------------------------------------------------------------------------------------------------------------ No results for input(s): TSH, T4TOTAL, T3FREE, THYROIDAB in the last 72 hours.  Invalid input(s): FREET3   Coagulation profile Recent Labs  Lab 10/22/17 1835  INR 1.03   ------------------------------------------------------------------------------------------------------------------- No results for input(s): DDIMER in the last 72  hours. -------------------------------------------------------------------------------------------------------------------  Cardiac Enzymes Recent Labs  Lab 10/22/17 1834  TROPONINI 5.71*   ------------------------------------------------------------------------------------------------------------------ Invalid input(s): POCBNP  ---------------------------------------------------------------------------------------------------------------  Urinalysis    Component Value Date/Time   COLORURINE YELLOW (A) 10/22/2017 2132   APPEARANCEUR HAZY (A) 10/22/2017 2132   LABSPEC 1.016 10/22/2017 2132   PHURINE 5.0 10/22/2017 2132   GLUCOSEU 50 (A) 10/22/2017 2132   HGBUR LARGE (A) 10/22/2017 2132   BILIRUBINUR NEGATIVE 10/22/2017 2132   KETONESUR 5 (A) 10/22/2017 2132   PROTEINUR 30 (A) 10/22/2017 2132   NITRITE NEGATIVE 10/22/2017 2132   LEUKOCYTESUR MODERATE (A) 10/22/2017 2132     RADIOLOGY: Dg Chest 2 View  Result Date: 10/22/2017 CLINICAL DATA:  Weakness.  Not eating or drinking EXAM: CHEST - 2 VIEW COMPARISON:  10/04/2016 FINDINGS: Streaky opacity at the right base. No edema or effusion. Normal heart size. Mild aortic tortuosity. Dual-chamber pacer leads from the left in stable position. IMPRESSION: Possible bronchopneumonia at the right base. Electronically Signed   By: Marnee Spring M.D.   On: 10/22/2017 19:13    EKG: Orders placed or performed during the hospital encounter of 10/22/17  . ED EKG within 10 minutes  . ED EKG within 10 minutes    IMPRESSION AND PLAN:  1. NSTEMI, will continue with aspirin and heparin drip.  Cardiology is consulted for further evaluation and treatment. 2.  CAP, will start patient on IV antibiotics azithromycin and ceftriaxone. 3.  Diabetes type 2.  Will monitor blood sugars before meals and at bedtime.  Low-carb diet.  Will use insulin treatment during the hospital stay. 4.  Acute renal failure, likely prerenal, secondary to poor p.o. Intake.   We will start gentle IV hydration and continue to monitor kidney function closely.  Avoid nephrotoxic medications. 5.  Hypertension, stable, continue home medications.  All the records are reviewed and case discussed with ED provider. Management plans discussed with the patient, family and they are in agreement.  CODE STATUS: Full    Code Status Orders  (From admission, onward)        Start     Ordered   10/22/17 2335  Full code  Continuous     10/22/17 2334    Code Status History    This patient has a current code status but no historical code status.    Advance Directive Documentation     Most Recent Value  Type of Advance Directive  Healthcare Power of Attorney  Pre-existing out of facility DNR order (yellow form or pink MOST form)  -  "MOST" Form in Place?  -       TOTAL TIME TAKING CARE OF THIS PATIENT: 45 minutes.    Cammy CopaAngela Corwyn Vora M.D on 10/23/2017 at 12:06 AM  Between 7am to 6pm - Pager - [phone]  After 6pm go to [email or web address] - password [email or web address] [facility]  [facility]  Office  [phone]  CC: Primary care physician; Leotis ShamesSingh, Jasmine, MD

## 2017-10-23 NOTE — Progress Notes (Signed)
Patient ID: Jesus [name], male   DOB: 1934-07-21, 83 y.o.   MRN: [id number]  ACP note.  Patient unable to participate in this conversation secondary to dementia Brother at the bedside  Diagnosis: Pneumonia and prostate infection, NSTEMI, dementia, hypertension, type 2 diabetes and BPH.  CODE STATUS discussed.  Brother states that the patient would be a full code but he does not want long-term on a ventilator.  Plan.  Medical management with antibiotics for infection.  Medical management for NSTEMI with aspirin, Lipitor and beta-blocker.  Appreciate cardiology consultation.  Get physical therapy consultation for tomorrow  Time spent on ACP discussion 17 minutes Dr. Alford Highlandichard Macsen Nuttall

## 2017-10-23 NOTE — Consult Note (Signed)
[facility] Cardiology  CARDIOLOGY CONSULT NOTE  Patient ID: Jesus [name] MRN: [id number] DOB/AGE: 1935/01/31 83 y.o.  Admit date: 10/22/2017 Referring Physician Dr. Cammy Copa Primary Physician Dr. Leotis Shames Primary Cardiologist Dr. Marcina Millard  Reason for Consultation NSTEMI  HPI: Mr. Jesus [name] is an 83 year old male with history of coronary artery disease, hypertension, hyperlipidemia, type 2 Diabetes Mellitus, and dementia who presented to the ED on 10/22/17 for a 2-3 day history of poor food intake and generalized weakness. Elevated troponin levels and ECG changes consistent with a NSTEMI warranted the cardiology consult. Today, patient only complains of a non-productive cough that has been ongoing for the past few days. He denies chest pain, shortness of breath, palpitations, or lower extremity swelling. He also denies fever, chills, dizziness, abdominal pain, or N/V/D. Patient is moderately demented and unsure why he is in the hospital.   Mr. Jesus [name] is followed by Dr. Darrold Junker at [facility]. Has a pacemaker and also a history of coronary stents placed 10-15 years ago. Last echo on file, performed in 2009, showed normal EF with mild LVH, mild MR, and moderate aortic sclerosis.   Review of systems complete and found to be negative unless listed above     Past Medical History:  Diagnosis Date  . Diabetes mellitus without complication (HCC)   . Hypertension     Past Surgical History:  Procedure Laterality Date  . APPENDECTOMY      Medications Prior to Admission  Medication Sig Dispense Refill Last Dose  . amLODipine (NORVASC) 10 MG tablet Take 10 mg by mouth daily.   10/22/2017 at Unknown time  . aspirin 81 MG chewable tablet Chew 81 mg by mouth daily.   10/22/2017 at Unknown time  . azelastine (ASTELIN) 0.1 % nasal spray Place 1 spray into both nostrils 2 (two) times daily. Use in each nostril as directed   PRN at PRN  . donepezil (ARICEPT) 10 MG tablet Take 10 mg by mouth at  bedtime.   10/21/2017 at Unknown time  . glipiZIDE (GLUCOTROL) 10 MG tablet Take 20 mg by mouth 2 (two) times daily before a meal.   10/22/2017 at Unknown time  . lisinopril (PRINIVIL,ZESTRIL) 40 MG tablet Take 40 mg by mouth daily.   10/22/2017 at Unknown time  . memantine (NAMENDA) 5 MG tablet Take 5 mg by mouth 2 (two) times daily.   10/22/2017 at Unknown time  . Multiple Vitamins-Minerals (MULTIVITAMIN WITH MINERALS) tablet Take 1 tablet by mouth daily.   10/22/2017 at Unknown time  . omega-3 acid ethyl esters (LOVAZA) 1 g capsule Take 2 g by mouth daily.   UNKNOWN at UNKNOWN  . pioglitazone (ACTOS) 30 MG tablet Take 30 mg by mouth daily.   10/22/2017 at Unknown time  . Potassium 99 MG TABS Take 1 tablet by mouth daily.   10/22/2017 at Unknown time  . tamsulosin (FLOMAX) 0.4 MG CAPS capsule Take 0.8 mg by mouth at bedtime.   10/21/2017 at Unknown time   Social History   Socioeconomic History  . Marital status: Single    Spouse name: Not on file  . Number of children: Not on file  . Years of education: Not on file  . Highest education level: Not on file  Occupational History  . Not on file  Social Needs  . Financial resource strain: Not on file  . Food insecurity:    Worry: Not on file    Inability: Not on file  . Transportation needs:  Medical: Not on file    Non-medical: Not on file  Tobacco Use  . Smoking status: Never Smoker  . Smokeless tobacco: Never Used  Substance and Sexual Activity  . Alcohol use: No  . Drug use: No  . Sexual activity: Not on file  Lifestyle  . Physical activity:    Days per week: Not on file    Minutes per session: Not on file  . Stress: Not on file  Relationships  . Social connections:    Talks on phone: Not on file    Gets together: Not on file    Attends religious service: Not on file    Active member of club or organization: Not on file    Attends meetings of clubs or organizations: Not on file    Relationship status: Not on file  . Intimate  partner violence:    Fear of current or ex partner: Not on file    Emotionally abused: Not on file    Physically abused: Not on file    Forced sexual activity: Not on file  Other Topics Concern  . Not on file  Social History Narrative  . Not on file    No family history on file.    Review of systems complete and found to be negative unless listed above      PHYSICAL EXAM  General: Well developed, well nourished, in no acute distress HEENT:  Normocephalic and atramatic Neck:  No JVD.  Lungs: Expiratory wheezes heard on left side Heart: HRRR . Normal S1 and S2 without gallops or murmurs.  Abdomen: Bowel sounds are positive, abdomen soft and non-tender  Msk:  Back normal. Normal strength and tone for age. Extremities: No clubbing, cyanosis or edema.   Neuro: Confused, unsure why he was brought into the hospital  Psych:  Good affect, responds moderately appropriately   Labs:   Lab Results  Component Value Date   WBC 13.3 (H) 10/23/2017   HGB 10.0 (L) 10/23/2017   HCT 28.4 (L) 10/23/2017   MCV 88.4 10/23/2017   PLT 138 (L) 10/23/2017    Recent Labs  Lab 10/23/17 0531  NA 132*  K 3.0*  CL 99  CO2 21*  BUN 30*  CREATININE 1.42*  CALCIUM 7.8*  GLUCOSE 198*   Lab Results  Component Value Date   TROPONINI 4.22 (HH) 10/23/2017   No results found for: CHOL No results found for: HDL No results found for: LDLCALC No results found for: TRIG No results found for: CHOLHDL No results found for: LDLDIRECT    Radiology: Dg Chest 2 View  Result Date: 10/22/2017 CLINICAL DATA:  Weakness.  Not eating or drinking EXAM: CHEST - 2 VIEW COMPARISON:  10/04/2016 FINDINGS: Streaky opacity at the right base. No edema or effusion. Normal heart size. Mild aortic tortuosity. Dual-chamber pacer leads from the left in stable position. IMPRESSION: Possible bronchopneumonia at the right base. Electronically Signed   By: Marnee Spring M.D.   On: 10/22/2017 19:13    EKG: Atrial sensed,  ventricular paced rhythm   ASSESSMENT AND PLAN:   1. NSTEMI, elevated troponin (5.71, 4.22)  - Patient remains asymptomatic, moderately demented, unlikely to be a candidate for cardiac catheterization    - Discuss options with patient's brother when available  - Manage medically for now  2. Community acquired pneumonia   - Agree with current plan of IV azithromycin and ceftriaxone  3. Acute kidney injury (creatinine 1.42)  - Continue with IV hydration  The history, physical exam findings, and plan of care were discussed with Dr. Harold HedgeKenneth Thaison Kolodziejski and all decision making was made in collaboration.    Signed: Andi HenceNicole L Stephens PA-C 10/23/2017, 7:55 AM

## 2017-10-24 ENCOUNTER — Inpatient Hospital Stay
Admit: 2017-10-24 | Discharge: 2017-10-24 | Disposition: A | Discharge status: Home / Self Care | Payer: Medicare Other | Attending: Internal Medicine | Admitting: Internal Medicine

## 2017-10-24 DIAGNOSIS — I214 Non-ST elevation (NSTEMI) myocardial infarction: Secondary | ICD-10-CM

## 2017-10-24 LAB — HEMOGLOBIN A1C
HEMOGLOBIN A1C: 7.5 % — AB (ref 4.8–5.6)
MEAN PLASMA GLUCOSE: 168.55 mg/dL

## 2017-10-24 LAB — GLUCOSE, CAPILLARY
GLUCOSE-CAPILLARY: 333 mg/dL — AB (ref 70–99)
GLUCOSE-CAPILLARY: 258 mg/dL — AB (ref 70–99)
Glucose-Capillary: 219 mg/dL — ABNORMAL HIGH (ref 70–99)
Glucose-Capillary: 329 mg/dL — ABNORMAL HIGH (ref 70–99)

## 2017-10-24 LAB — BASIC METABOLIC PANEL
Anion gap: 9 (ref 5–15)
BUN: 40 mg/dL — ABNORMAL HIGH (ref 8–23)
CALCIUM: 8.1 mg/dL — AB (ref 8.9–10.3)
CO2: 23 mmol/L (ref 22–32)
CREATININE: 1.88 mg/dL — AB (ref 0.61–1.24)
Chloride: 101 mmol/L (ref 98–111)
GFR, EST AFRICAN AMERICAN: 37 mL/min — AB (ref >60)
GFR, EST NON AFRICAN AMERICAN: 32 mL/min — AB (ref >60)
Glucose, Bld: 235 mg/dL — ABNORMAL HIGH (ref 70–99)
Potassium: 3.8 mmol/L (ref 3.5–5.1)
SODIUM: 133 mmol/L — AB (ref 135–145)

## 2017-10-24 LAB — ECHOCARDIOGRAM COMPLETE
HEIGHTINCHES: 67 in
WEIGHTICAEL: 2604.8 [oz_av]

## 2017-10-24 LAB — TROPONIN I: TROPONIN I: 15.57 ng/mL — AB (ref <0.03)

## 2017-10-24 LAB — HEPARIN LEVEL (UNFRACTIONATED): Heparin Unfractionated: 0.17 IU/mL — ABNORMAL LOW (ref 0.30–0.70)

## 2017-10-24 MED ORDER — SODIUM CHLORIDE 0.9 % IV SOLN
INTRAVENOUS | Status: DC
  Administered 2017-10-24: 08:00:00 via INTRAVENOUS

## 2017-10-24 MED ORDER — CLOPIDOGREL BISULFATE 75 MG PO TABS: 75.0000 mg | ORAL_TABLET | Freq: Every day | ORAL | Status: DC

## 2017-10-24 MED ORDER — HEPARIN (PORCINE) IN NACL 100-0.45 UNIT/ML-% IJ SOLN
1500.0000 [IU]/h | INTRAMUSCULAR | Status: DC
  Administered 2017-10-24: 1000 [IU]/h via INTRAVENOUS
  Administered 2017-10-25: 1350 [IU]/h via INTRAVENOUS
  Administered 2017-10-25: 1250 [IU]/h via INTRAVENOUS
  Filled 2017-10-24 (×3): qty 250

## 2017-10-24 MED ORDER — PERFLUTREN LIPID MICROSPHERE
1.0000 mL | INTRAVENOUS | Status: AC | PRN
  Administered 2017-10-24: 2 mL via INTRAVENOUS
  Filled 2017-10-24: qty 10

## 2017-10-24 MED ORDER — ASPIRIN 81 MG PO CHEW
81.0000 mg | CHEWABLE_TABLET | ORAL | Status: AC
  Administered 2017-10-25: 81 mg via ORAL
  Filled 2017-10-24: qty 1

## 2017-10-24 MED ORDER — HEPARIN BOLUS VIA INFUSION
2250.0000 [IU] | Freq: Once | INTRAVENOUS | Status: AC
  Administered 2017-10-24: 2250 [IU] via INTRAVENOUS
  Filled 2017-10-24: qty 2250

## 2017-10-24 MED ORDER — HEPARIN BOLUS VIA INFUSION
4000.0000 [IU] | Freq: Once | INTRAVENOUS | Status: AC
  Administered 2017-10-24: 4000 [IU] via INTRAVENOUS
  Filled 2017-10-24: qty 4000

## 2017-10-24 MED ORDER — ORAL CARE MOUTH RINSE
15.0000 mL | Freq: Two times a day (BID) | OROMUCOSAL | Status: DC
Start: 2017-10-24 — End: 2017-10-29
  Administered 2017-10-24 – 2017-10-29 (×5): 15 mL via OROMUCOSAL

## 2017-10-24 MED ORDER — CLOPIDOGREL BISULFATE 75 MG PO TABS
300.0000 mg | ORAL_TABLET | Freq: Once | ORAL | Status: AC
  Administered 2017-10-24: 300 mg via ORAL
  Filled 2017-10-24: qty 4

## 2017-10-24 MED ORDER — SODIUM CHLORIDE 0.9 % WEIGHT BASED INFUSION: 3.0000 mL/kg/h | INTRAVENOUS | Status: DC

## 2017-10-24 MED ORDER — SODIUM CHLORIDE 0.9 % IV SOLN
250.0000 mL | INTRAVENOUS | Status: DC | PRN
  Administered 2017-10-26: 10 mL via INTRAVENOUS

## 2017-10-24 MED ORDER — SODIUM CHLORIDE 0.9% FLUSH: 3.0000 mL | INTRAVENOUS | Status: DC | PRN

## 2017-10-24 MED ORDER — ALBUTEROL SULFATE (2.5 MG/3ML) 0.083% IN NEBU
2.5000 mg | INHALATION_SOLUTION | RESPIRATORY_TRACT | Status: DC | PRN
  Administered 2017-10-24 – 2017-10-25 (×2): 2.5 mg via RESPIRATORY_TRACT
  Filled 2017-10-24 (×2): qty 3

## 2017-10-24 MED ORDER — INSULIN GLARGINE 100 UNIT/ML ~~LOC~~ SOLN
10.0000 [IU] | Freq: Every day | SUBCUTANEOUS | Status: DC
  Administered 2017-10-24: 10 [IU] via SUBCUTANEOUS
  Filled 2017-10-24 (×2): qty 0.1

## 2017-10-24 MED ORDER — SODIUM CHLORIDE 0.9 % WEIGHT BASED INFUSION: 1.0000 mL/kg/h | INTRAVENOUS | Status: DC

## 2017-10-24 MED ORDER — SODIUM CHLORIDE 0.9% FLUSH
3.0000 mL | Freq: Two times a day (BID) | INTRAVENOUS | Status: DC
  Administered 2017-10-25: 3 mL via INTRAVENOUS

## 2017-10-24 NOTE — Progress Notes (Signed)
Jesus [name] is an 83 year old male with history of coronary artery disease, hypertension, hyperlipidemia, type 2 Diabetes Mellitus, and dementia who presented to the ED on 10/22/17 for 2-3 day history of poor food intake and generalized weakness. Patient's brother was at bed side today and explained that Mr. Jesus [name] has been "moving slower" and having increased shortness of breath over the past 3 weeks. Due to the elevated troponin levels on admission, we discussed the option of doing a cardiac catheterization, in which the brother and patient both wanted to move forward with. Today, patient doesn't complain of chest pain, palpitations, or peripheral edema, but does complain of increased shortness of breath.    Mr. Jesus [name] is followed by Dr. Darrold Junker at [facility]. Has a pacemaker and a history of coronary stents placed several years ago.   Vitals:   10/24/17 0238 10/24/17 0540 10/24/17 0728 10/24/17 0748  BP:  (!) 123/51 122/67   Pulse:  62 64   Resp:  17    Temp:  98.4 F (36.9 C) 99.1 F (37.3 C)   TempSrc:  Oral Oral   SpO2: 96% 96% 94% 94%  Weight:  73.8 kg (162 lb 12.8 oz)    Height:         Intake/Output Summary (Last 24 hours) at 10/24/2017 1038 Last data filed at 10/24/2017 1021 Gross per 24 hour  Intake 930 ml  Output 0 ml  Net 930 ml      PHYSICAL EXAM  General: Well developed, well nourished, in mild distress HEENT:  Normocephalic and atramatic Neck:  No JVD.  Lungs: Expiratory wheezes heard bilaterally. On 2 L O2 Heart: HRRR . Normal S1 and S2 without gallops or murmurs.  Abdomen: Bowel sounds are positive, abdomen soft and non-tender  Msk:  Back normal. Normal strength and tone for age. Extremities: No clubbing, cyanosis or edema.   Neuro: Slightly confused, oriented to place  Psych:  Good affect, responds moderately appropriately   LABS: Basic Metabolic Panel: Recent Labs    10/23/17 0531 10/24/17 0557  NA 132* 133*  K 3.0* 3.8   CL 99 101  CO2 21* 23  GLUCOSE 198* 235*  BUN 30* 40*  CREATININE 1.42* 1.88*  CALCIUM 7.8* 8.1*   Liver Function Tests: Recent Labs    10/23/17 0531  AST 54*  ALT 24  ALKPHOS 53  BILITOT 0.8  PROT 5.7*  ALBUMIN 2.9*   No results for input(s): LIPASE, AMYLASE in the last 72 hours. CBC: Recent Labs    10/22/17 1834 10/23/17 0531  WBC 15.1* 13.3*  HGB 11.3* 10.0*  HCT 31.2* 28.4*  MCV 86.8 88.4  PLT 148* 138*   Cardiac Enzymes: Recent Labs    10/22/17 1834 10/23/17 0048 10/24/17 0557  TROPONINI 5.71* 4.22* 15.57*   BNP: Invalid input(s): POCBNP D-Dimer: No results for input(s): DDIMER in the last 72 hours. Hemoglobin A1C: No results for input(s): HGBA1C in the last 72 hours. Fasting Lipid Panel: Recent Labs    10/23/17 0531  CHOL 162  HDL 42  LDLCALC 95  TRIG 126  CHOLHDL 3.9   Thyroid Function Tests: No results for input(s): TSH, T4TOTAL, T3FREE, THYROIDAB in the last 72 hours.  Invalid input(s): FREET3 Anemia Panel: No results for input(s): VITAMINB12, FOLATE, FERRITIN, TIBC, IRON, RETICCTPCT in the last 72 hours.  Dg Chest 2 View  Result Date: 10/22/2017 CLINICAL DATA:  Weakness.  Not eating or drinking EXAM: CHEST - 2 VIEW COMPARISON:  10/04/2016 FINDINGS: Streaky opacity at the right base. No edema or effusion. Normal heart size. Mild aortic tortuosity. Dual-chamber pacer leads from the left in stable position. IMPRESSION: Possible bronchopneumonia at the right base. Electronically Signed   By: Marnee SpringJonathon  Watts M.D.   On: 10/22/2017 19:13    Echo: Pending  ASSESSMENT AND PLAN:  Active Problems:   NSTEMI (non-ST elevated myocardial infarction) (HCC)    1. NSTEMI, elevated troponin (5.71, 4.22, 15.57)   - Patient developing increasing shortness of breath. After speaking with the patient and patient's brother, decided to proceed with cardiac catheterization on 10/25/17  - Cath orders placed, NPO starting at midnight   - Resume heparin drip  per pharmacy  2. Acute kidney injury (creatinine 1.42, 1.88)  - Continue to monitor - especially before cath procedure tomorrow  - Continue with IV hydration  3. Pneumonia and prostate infection   - Medical management with antibiotics for infection    The history, physical exam findings, and plan of care were discussed with Dr. Harold HedgeKenneth Kingslee Mairena and all decision making was made in collaboration.    Andi Henceicole L Stephens  PA-C 10/24/2017 10:38 AM

## 2017-10-24 NOTE — Plan of Care (Signed)
  Problem: Education: Goal: Knowledge of General Education information will improve Outcome: Progressing   Problem: Activity: Goal: Risk for activity intolerance will decrease Outcome: Progressing   Problem: Nutrition: Goal: Adequate nutrition will be maintained Outcome: Progressing   Problem: Coping: Goal: Level of anxiety will decrease Outcome: Progressing   Problem: Elimination: Goal: Will not experience complications related to bowel motility Outcome: Progressing   Problem: Pain Managment: Goal: General experience of comfort will improve Outcome: Progressing   Problem: Safety: Goal: Ability to remain free from injury will improve Outcome: Progressing   

## 2017-10-24 NOTE — Progress Notes (Signed)
Patient ID: Jesus [name], male   DOB: 07/20/1934, 83 y.o.   MRN: [id number]  [employer] PROGRESS NOTE  Jesus [name] QMV:[id number] DOB: 07/20/1934 DOA: 10/22/2017 PCP: Leotis ShamesSingh, Jasmine, MD  HPI/Subjective: Patient having a little upper airway wheeze.  Patient feels better than when he came into the hospital.  Little cough.  The urinary urgency  is less.  No complaints of chest pain.  Objective: Vitals:   10/24/17 0728 10/24/17 0748  BP: 122/67   Pulse: 64   Resp:    Temp: 99.1 F (37.3 C)   SpO2: 94% 94%    Filed Weights   10/23/17 0500 10/24/17 0540 10/24/17 1340  Weight: 74.3 kg (163 lb 11.2 oz) 73.8 kg (162 lb 12.8 oz) 73.8 kg (162 lb 11.2 oz)    ROS: Review of Systems  Constitutional: Positive for malaise/fatigue. Negative for chills and fever.  Eyes: Negative for blurred vision.  Respiratory: Positive for cough and shortness of breath.   Cardiovascular: Negative for chest pain.  Gastrointestinal: Negative for abdominal pain, constipation, diarrhea, nausea and vomiting.  Genitourinary: Negative for dysuria and urgency.  Musculoskeletal: Negative for joint pain.  Neurological: Negative for dizziness and headaches.   Exam: Physical Exam  Constitutional: He is oriented to person, place, and time.  HENT:  Nose: No mucosal edema.  Mouth/Throat: No oropharyngeal exudate or posterior oropharyngeal edema.  Eyes: Pupils are equal, round, and reactive to light. Conjunctivae and lids are normal.  Neck: No JVD present. Carotid bruit is not present. No edema present. No thyroid mass and no thyromegaly present.  Cardiovascular: S1 normal and S2 normal. Exam reveals no gallop.  No murmur heard. Pulses:      Dorsalis pedis pulses are 2+ on the right side, and 2+ on the left side.  Respiratory: No respiratory distress. He has decreased breath sounds in the right lower field and the left lower field. He has no wheezes. He has no rhonchi. He has no rales.  When the patient breathes  through his nose no upper airway congestion noted.  When he is breathing through his  mouth.  His upper airway congestion is present.  GI: Soft. Bowel sounds are normal. There is no tenderness.  Musculoskeletal:       Right ankle: He exhibits no swelling.       Left ankle: He exhibits no swelling.  Lymphadenopathy:    He has no cervical adenopathy.  Neurological: He is alert and oriented to person, place, and time. No cranial nerve deficit.  Skin: Skin is warm. No rash noted. Nails show no clubbing.  Psychiatric: He has a normal mood and affect.      Data Reviewed: Basic Metabolic Panel: Recent Labs  Lab 10/22/17 1834 10/23/17 0531 10/24/17 0557  NA 130* 132* 133*  K 3.5 3.0* 3.8  CL 95* 99 101  CO2 23 21* 23  GLUCOSE 294* 198* 235*  BUN 33* 30* 40*  CREATININE 1.54* 1.42* 1.88*  CALCIUM 8.7* 7.8* 8.1*   Liver Function Tests: Recent Labs  Lab 10/23/17 0531  AST 54*  ALT 24  ALKPHOS 53  BILITOT 0.8  PROT 5.7*  ALBUMIN 2.9*   CBC: Recent Labs  Lab 10/22/17 1834 10/23/17 0531  WBC 15.1* 13.3*  HGB 11.3* 10.0*  HCT 31.2* 28.4*  MCV 86.8 88.4  PLT 148* 138*   Cardiac Enzymes: Recent Labs  Lab 10/22/17 1834 10/23/17 0048 10/24/17 0557  TROPONINI 5.71* 4.22* 15.57*    CBG: Recent Labs  Lab 10/23/17 1135  10/23/17 1712 10/23/17 2107 10/24/17 0729 10/24/17 1105  GLUCAP 221* 216* 247* 219* 333*    Recent Results (from the past 240 hour(s))  Blood culture (routine x 2)     Status: None (Preliminary result)   Collection Time: 10/22/17  8:36 PM  Result Value Ref Range Status   Specimen Description BLOOD RIGHT ANTECUBITAL  Final   Special Requests   Final    BOTTLES DRAWN AEROBIC AND ANAEROBIC Blood Culture adequate volume   Culture   Final    NO GROWTH 2 DAYS Performed at [facility], [address] [place], [place] [address]    Report Status PENDING  Incomplete  Blood culture (routine x 2)     Status: None (Preliminary result)    Collection Time: 10/22/17  8:36 PM  Result Value Ref Range Status   Specimen Description BLOOD RIGHT ANTECUBITAL  Final   Special Requests   Final    BOTTLES DRAWN AEROBIC AND ANAEROBIC Blood Culture adequate volume   Culture   Final    NO GROWTH 2 DAYS Performed at [facility], [address], [place], [place] [address]    Report Status PENDING  Incomplete     Studies: Dg Chest 2 View  Result Date: 10/22/2017 CLINICAL DATA:  Weakness.  Not eating or drinking EXAM: CHEST - 2 VIEW COMPARISON:  10/04/2016 FINDINGS: Streaky opacity at the right base. No edema or effusion. Normal heart size. Mild aortic tortuosity. Dual-chamber pacer leads from the left in stable position. IMPRESSION: Possible bronchopneumonia at the right base. Electronically Signed   By: Marnee Spring M.D.   On: 10/22/2017 19:13    Scheduled Meds: . amLODipine  10 mg Oral Daily  . aspirin  81 mg Oral Daily  . atorvastatin  40 mg Oral q1800  . budesonide (PULMICORT) nebulizer solution  0.5 mg Nebulization BID  . [START ON 10/25/2017] clopidogrel  75 mg Oral Daily  . docusate sodium  100 mg Oral BID  . donepezil  10 mg Oral QHS  . insulin aspart  0-5 Units Subcutaneous QHS  . insulin aspart  0-9 Units Subcutaneous TID WC  . insulin glargine  10 Units Subcutaneous Daily  . ipratropium-albuterol  3 mL Nebulization Q6H  . lisinopril  40 mg Oral Daily  . mouth rinse  15 mL Mouth Rinse BID  . memantine  5 mg Oral BID  . metoprolol succinate  25 mg Oral Daily  . multivitamin with minerals  1 tablet Oral Daily  . tamsulosin  0.8 mg Oral QHS   Continuous Infusions: . sodium chloride 50 mL/hr at 10/24/17 0753  . sodium chloride    . azithromycin Stopped (10/24/17 0754)  . cefTRIAXone (ROCEPHIN)  IV Stopped (10/24/17 0981)  . heparin 1,000 Units/hr (10/24/17 1155)    Assessment/Plan:  1. Pneumonia, prostate infection.  On Rocephin and Zithromax.  Urine culture pending.  Continue nebulizer treatments.   Upper airway congestion. 2. NSTEMI.  Troponin increasing to 15.  Cardiology now decided to do a cardiac catheterization for tomorrow.  Continue aspirin, toprol XL and atorvastatin.  Plavix added today.  Echocardiogram showed normal EF. 3. Essential hypertension on Norvasc, Toprol, lisinopril 4. History of dementia without behavioral disturbance on Aricept 5. Type 2 diabetes mellitus.  Lantus started 10 units daily.  Sliding scale insulin.  Hemoglobin A1c added on. 6. BPH on Flomax 7. Hypokalemia replace potassium orally 8. Acute kidney chronic kidney disease stage III monitor closely.  Start IV fluid hydration again with  increase in creatinine and planning cardiac catheterization.  Code Status:     Code Status Orders  (From admission, onward)        Start     Ordered   10/22/17 2335  Full code  Continuous     10/22/17 2334    Code Status History    This patient has a current code status but no historical code status.    Advance Directive Documentation     Most Recent Value  Type of Advance Directive  Healthcare Power of Attorney  Pre-existing out of facility DNR order (yellow form or pink MOST form)  -  "MOST" Form in Place?  -     Family Communication: Brother at the bedside Disposition Plan: To be determined based on clinical course  Consultants:  Cardiology  Antibiotics:  Rocephin  Zithromax  Time spent: 28 minutes  Cataleia Gade [employer]

## 2017-10-24 NOTE — Consult Note (Signed)
ANTICOAGULATION CONSULT NOTE - Initial Consult  Pharmacy Consult for heparin drip Indication: chest pain/ACS  No Known Allergies  Patient Measurements: Height: 5\' 7"  (170.2 cm) Weight: 162 lb 12.8 oz (73.8 kg) IBW/kg (Calculated) : 66.1 Heparin Dosing Weight: 75.3kg  Vital Signs: Temp: 99.1 F (37.3 C) (07/10 0728) Temp Source: Oral (07/10 0728) BP: 122/67 (07/10 0728) Pulse Rate: 64 (07/10 0728)  Labs: Recent Labs    10/22/17 1834 10/22/17 1835 10/23/17 0048 10/23/17 0531 10/24/17 0557  HGB 11.3*  --   --  10.0*  --   HCT 31.2*  --   --  28.4*  --   PLT 148*  --   --  138*  --   APTT  --  34  --   --   --   LABPROT  --  13.4  --   --   --   INR  --  1.03  --   --   --   HEPARINUNFRC  --   --   --  0.10*  --   CREATININE 1.54*  --   --  1.42* 1.88*  TROPONINI 5.71*  --  4.22*  --  15.57*    Estimated Creatinine Clearance: 28.8 mL/min (A) (by C-G formula based on SCr of 1.88 mg/dL (H)).   Medical History: Past Medical History:  Diagnosis Date  . Diabetes mellitus without complication (HCC)   . Hypertension     Medications:  Scheduled:  . amLODipine  10 mg Oral Daily  . aspirin  81 mg Oral Daily  . atorvastatin  40 mg Oral q1800  . budesonide (PULMICORT) nebulizer solution  0.5 mg Nebulization BID  . [START ON 10/25/2017] clopidogrel  75 mg Oral Daily  . docusate sodium  100 mg Oral BID  . donepezil  10 mg Oral QHS  . heparin  4,000 Units Intravenous Once  . insulin aspart  0-5 Units Subcutaneous QHS  . insulin aspart  0-9 Units Subcutaneous TID WC  . ipratropium-albuterol  3 mL Nebulization Q6H  . lisinopril  40 mg Oral Daily  . mouth rinse  15 mL Mouth Rinse BID  . memantine  5 mg Oral BID  . metoprolol succinate  25 mg Oral Daily  . multivitamin with minerals  1 tablet Oral Daily  . tamsulosin  0.8 mg Oral QHS    Assessment: Patient is a [age] year old male who presents with weakness found to have elevated troponin. Pt was started on heparin drip in  ER but was d/c by cardiology the next AM since pt was not a cardiac cath candidate and pt complains of no chest pain. Troponin continues to rise-up to 15.57 this AM. Pharmacy consulted to restart heparin drip. Drip was originally started at 900 units/hr per protocol. This resulted in a HL of 0.10. Therefore I am choosing to start a higher dose of 1000 units/hr.  Goal of Therapy:  Heparin level 0.3-0.7 units/ml Monitor platelets by anticoagulation protocol: Yes   Plan:  Give 4000 units bolus x 1 Start heparin infusion at 1000 units/hr Check anti-Xa level in 8 hours and daily while on heparin Continue to monitor H&H and platelets  Mylo Choi D Jaslynn Thome, Pharm.D, BCPS Clinical Pharmacist 10/24/2017,11:00 AM

## 2017-10-24 NOTE — Progress Notes (Signed)
PT Hold Note  Patient Details Name: Jesus [name] MRN: [id number] DOB: Dec 10, 1934   Evaluation Hold:    Reason Eval/Treat Not Completed: Medical issues which prohibited therapy. Order received and chart reviewed. Troponin trended up significantly from 4.22 ng/mL on 10/23/17 at 0048 to 15.57 ng/mL on 10/24/08 at 0557. PT evaluation currently contraindicated until troponin levels off or starts to trend down. Will perform PT evaluation once pt is medically stable.  Sharalyn InkJason D Tristine Langi PT, DPT, GCS  [name] 10/24/2017, 8:39 AM

## 2017-10-24 NOTE — Consult Note (Signed)
ANTICOAGULATION CONSULT NOTE - Initial Consult  Pharmacy Consult for heparin drip Indication: chest pain/ACS  No Known Allergies  Patient Measurements: Height: 5\' 7"  (170.2 cm) Weight: 162 lb 11.2 oz (73.8 kg) IBW/kg (Calculated) : 66.1 Heparin Dosing Weight: 75.3kg  Vital Signs: Temp: 98.1 F (36.7 C) (07/10 1942) Temp Source: Oral (07/10 1757) BP: 149/100 (07/10 1942) Pulse Rate: 73 (07/10 1942)  Labs: Recent Labs    10/22/17 1834 10/22/17 1835 10/23/17 0048 10/23/17 0531 10/24/17 0557 10/24/17 2014  HGB 11.3*  --   --  10.0*  --   --   HCT 31.2*  --   --  28.4*  --   --   PLT 148*  --   --  138*  --   --   APTT  --  34  --   --   --   --   LABPROT  --  13.4  --   --   --   --   INR  --  1.03  --   --   --   --   HEPARINUNFRC  --   --   --  0.10*  --  0.17*  CREATININE 1.54*  --   --  1.42* 1.88*  --   TROPONINI 5.71*  --  4.22*  --  15.57*  --     Estimated Creatinine Clearance: 28.8 mL/min (A) (by C-G formula based on SCr of 1.88 mg/dL (H)).   Medical History: Past Medical History:  Diagnosis Date  . Diabetes mellitus without complication (HCC)   . Hypertension     Medications:  Scheduled:  . amLODipine  10 mg Oral Daily  . aspirin  81 mg Oral Daily  . [START ON 10/25/2017] aspirin  81 mg Oral Pre-Cath  . atorvastatin  40 mg Oral q1800  . budesonide (PULMICORT) nebulizer solution  0.5 mg Nebulization BID  . [START ON 10/25/2017] clopidogrel  75 mg Oral Daily  . docusate sodium  100 mg Oral BID  . donepezil  10 mg Oral QHS  . heparin  2,250 Units Intravenous Once  . insulin aspart  0-5 Units Subcutaneous QHS  . insulin aspart  0-9 Units Subcutaneous TID WC  . insulin glargine  10 Units Subcutaneous Daily  . ipratropium-albuterol  3 mL Nebulization Q6H  . lisinopril  40 mg Oral Daily  . mouth rinse  15 mL Mouth Rinse BID  . memantine  5 mg Oral BID  . metoprolol succinate  25 mg Oral Daily  . multivitamin with minerals  1 tablet Oral Daily  .  sodium chloride flush  3 mL Intravenous Q12H  . tamsulosin  0.8 mg Oral QHS    Assessment: Patient is a 83 year old male who presents with weakness found to have elevated troponin. Pt was started on heparin drip in ER but was d/c by cardiology the next AM since pt was not a cardiac cath candidate and pt complains of no chest pain. Troponin continues to rise-up to 15.57 this AM. Pharmacy consulted to restart heparin drip.   Goal of Therapy:  Heparin level 0.3-0.7 units/ml Monitor platelets by anticoagulation protocol: Yes   Plan:  Will bolus heparin 2250 units and increase infusion to 1250 units/hr with next HL in 8 hours.   Luisa HartScott Naika Noto, PharmD Clinical Pharmacist  10/24/2017,8:48 PM

## 2017-10-24 NOTE — Progress Notes (Signed)
MD paged to notify of patients shortness of breath and wheezing. PRN orders put in, administered, patient feeling a little better. Laying in bed.Will continue to monitor patient.  

## 2017-10-24 NOTE — Progress Notes (Signed)
*  PRELIMINARY RESULTS* Echocardiogram 2D Echocardiogram has been performed.  Jesus [name] M [name] [name] 10/24/2017, 12:23 PM

## 2017-10-25 ENCOUNTER — Inpatient Hospital Stay: Payer: Medicare Other

## 2017-10-25 LAB — GLUCOSE, CAPILLARY
GLUCOSE-CAPILLARY: 260 mg/dL — AB (ref 70–99)
GLUCOSE-CAPILLARY: 373 mg/dL — AB (ref 70–99)
GLUCOSE-CAPILLARY: 312 mg/dL — AB (ref 70–99)
Glucose-Capillary: 318 mg/dL — ABNORMAL HIGH (ref 70–99)

## 2017-10-25 LAB — CBC
HEMATOCRIT: 27.8 % — AB (ref 40.0–52.0)
HEMOGLOBIN: 9.8 g/dL — AB (ref 13.0–18.0)
MCH: 31.3 pg (ref 26.0–34.0)
MCHC: 35.4 g/dL (ref 32.0–36.0)
MCV: 88.5 fL (ref 80.0–100.0)
Platelets: 184 10*3/uL (ref 150–440)
RBC: 3.14 MIL/uL — AB (ref 4.40–5.90)
RDW: 13.9 % (ref 11.5–14.5)
WBC: 11.7 10*3/uL — AB (ref 3.8–10.6)

## 2017-10-25 LAB — BASIC METABOLIC PANEL
Anion gap: 11 (ref 5–15)
BUN: 47 mg/dL — ABNORMAL HIGH (ref 8–23)
CHLORIDE: 102 mmol/L (ref 98–111)
CO2: 18 mmol/L — AB (ref 22–32)
Calcium: 7.9 mg/dL — ABNORMAL LOW (ref 8.9–10.3)
Creatinine, Ser: 1.77 mg/dL — ABNORMAL HIGH (ref 0.61–1.24)
GFR calc non Af Amer: 34 mL/min — ABNORMAL LOW (ref >60)
GFR, EST AFRICAN AMERICAN: 40 mL/min — AB (ref >60)
Glucose, Bld: 259 mg/dL — ABNORMAL HIGH (ref 70–99)
POTASSIUM: 3.9 mmol/L (ref 3.5–5.1)
Sodium: 131 mmol/L — ABNORMAL LOW (ref 135–145)

## 2017-10-25 LAB — HEPARIN LEVEL (UNFRACTIONATED)
HEPARIN UNFRACTIONATED: 0.3 [IU]/mL (ref 0.30–0.70)
HEPARIN UNFRACTIONATED: 0.59 [IU]/mL (ref 0.30–0.70)

## 2017-10-25 LAB — URINE CULTURE
CULTURE: NO GROWTH
Special Requests: NORMAL

## 2017-10-25 MED ORDER — FUROSEMIDE 10 MG/ML IJ SOLN
40.0000 mg | Freq: Every day | INTRAMUSCULAR | Status: DC
Start: 2017-10-26 — End: 2017-10-27
  Administered 2017-10-26 (×2): 40 mg via INTRAVENOUS
  Filled 2017-10-25 (×3): qty 4

## 2017-10-25 MED ORDER — INSULIN ASPART 100 UNIT/ML ~~LOC~~ SOLN
3.0000 [IU] | Freq: Three times a day (TID) | SUBCUTANEOUS | Status: DC
Start: 2017-10-25 — End: 2017-10-29
  Administered 2017-10-25 – 2017-10-29 (×8): 3 [IU] via SUBCUTANEOUS
  Filled 2017-10-25 (×7): qty 1

## 2017-10-25 MED ORDER — FUROSEMIDE 10 MG/ML IJ SOLN
40.0000 mg | Freq: Once | INTRAMUSCULAR | Status: AC
  Administered 2017-10-25: 40 mg via INTRAVENOUS
  Filled 2017-10-25: qty 4

## 2017-10-25 MED ORDER — INSULIN GLARGINE 100 UNIT/ML ~~LOC~~ SOLN
14.0000 [IU] | Freq: Every day | SUBCUTANEOUS | Status: DC
  Administered 2017-10-25: 14 [IU] via SUBCUTANEOUS
  Filled 2017-10-25 (×2): qty 0.14

## 2017-10-25 MED ORDER — METHYLPREDNISOLONE SODIUM SUCC 40 MG IJ SOLR
40.0000 mg | Freq: Every day | INTRAMUSCULAR | Status: DC
Start: 2017-10-25 — End: 2017-10-26
  Administered 2017-10-25 – 2017-10-26 (×2): 40 mg via INTRAVENOUS
  Filled 2017-10-25 (×2): qty 1

## 2017-10-25 MED ORDER — AZITHROMYCIN 250 MG PO TABS
250.0000 mg | ORAL_TABLET | Freq: Every day | ORAL | Status: DC
  Administered 2017-10-26 – 2017-10-29 (×4): 250 mg via ORAL
  Filled 2017-10-25 (×4): qty 1

## 2017-10-25 MED ORDER — IPRATROPIUM-ALBUTEROL 0.5-2.5 (3) MG/3ML IN SOLN
RESPIRATORY_TRACT | Status: AC
  Filled 2017-10-25: qty 3

## 2017-10-25 NOTE — Progress Notes (Signed)
Patient back on Ventimask.  Unable to breathe without it.  Will defer cardiac catheterization today.  Continue to diuresis following renal function and aggressive treatment of his pulmonary status.  Will consider cardiac cath when stable.

## 2017-10-25 NOTE — Progress Notes (Signed)
Patient Name: Jesus [name] Date of Encounter: 10/25/2017  Hospital Problem List     Active Problems:   NSTEMI (non-ST elevated myocardial infarction) [facility])    Patient Profile     83 year old male with history of dementia, diabetes and hypertension who was admitted "not feeling well".  Had an elevated creatinine as well as elevated serum troponin.  He is ventricular paced.  EKG is not helpful.  Tentatively scheduled for left heart cath today however has been incontinent, more confused and hypoxic requiring increased oxygen.  Subjective   Difficult historian not answering questions appropriately this morning.  Saturations reduced.  Inpatient Medications    . amLODipine  10 mg Oral Daily  . aspirin  81 mg Oral Daily  . atorvastatin  40 mg Oral q1800  . budesonide (PULMICORT) nebulizer solution  0.5 mg Nebulization BID  . docusate sodium  100 mg Oral BID  . donepezil  10 mg Oral QHS  . furosemide  40 mg Intravenous Once  . insulin aspart  0-5 Units Subcutaneous QHS  . insulin aspart  0-9 Units Subcutaneous TID WC  . insulin glargine  10 Units Subcutaneous Daily  . ipratropium-albuterol  3 mL Nebulization Q6H  . ipratropium-albuterol      . lisinopril  40 mg Oral Daily  . mouth rinse  15 mL Mouth Rinse BID  . memantine  5 mg Oral BID  . metoprolol succinate  25 mg Oral Daily  . multivitamin with minerals  1 tablet Oral Daily  . sodium chloride flush  3 mL Intravenous Q12H  . tamsulosin  0.8 mg Oral QHS    Vital Signs    Vitals:   10/24/17 1942 10/25/17 0234 10/25/17 0521 10/25/17 0728  BP: (!) 149/100  (!) 116/54   Pulse: 73 73 81   Resp: 18 (!) 22    Temp: 98.1 F (36.7 C)  99 F (37.2 C)   TempSrc:   Oral   SpO2: 90% (!) 79% 90% (!) 85%  Weight:   76.5 kg (168 lb 9.6 oz)   Height:        Intake/Output Summary (Last 24 hours) at 10/25/2017 0759 Last data filed at 10/25/2017 0542 Gross per 24 hour  Intake 1530 ml  Output 250 ml  Net 1280 ml   Filed Weights   10/24/17 0540 10/24/17 1340 10/25/17 0521  Weight: 73.8 kg (162 lb 12.8 oz) 73.8 kg (162 lb 11.2 oz) 76.5 kg (168 lb 9.6 oz)    Physical Exam    GEN: Well nourished, well developed, in no acute distress.  HEENT: normal.  Neck: Supple, no JVD, carotid bruits, or masses. Cardiac: RRR, no murmurs, rubs, or gallops. No clubbing, cyanosis, edema.  Radials/DP/PT 2+ and equal bilaterally.  Respiratory: Crackles bilaterally. GI: Soft, nontender, nondistended, BS + x 4. MS: no deformity or atrophy. Skin: warm and dry, no rash. Neuro:  Strength and sensation are intact. Psych: Somewhat confused.  Labs    CBC Recent Labs    10/23/17 0531 10/25/17 0613  WBC 13.3* 11.7*  HGB 10.0* 9.8*  HCT 28.4* 27.8*  MCV 88.4 88.5  PLT 138* 184   Basic Metabolic Panel Recent Labs    16/10/96 0531 10/24/17 0557  NA 132* 133*  K 3.0* 3.8  CL 99 101  CO2 21* 23  GLUCOSE 198* 235*  BUN 30* 40*  CREATININE 1.42* 1.88*  CALCIUM 7.8* 8.1*   Liver Function Tests Recent Labs    10/23/17 0531  AST 54*  ALT 24  ALKPHOS 53  BILITOT 0.8  PROT 5.7*  ALBUMIN 2.9*   No results for input(s): LIPASE, AMYLASE in the last 72 hours. Cardiac Enzymes Recent Labs    10/22/17 1834 10/23/17 0048 10/24/17 0557  TROPONINI 5.71* 4.22* 15.57*   BNP No results for input(s): BNP in the last 72 hours. D-Dimer No results for input(s): DDIMER in the last 72 hours. Hemoglobin A1C Recent Labs    10/24/17 0557  HGBA1C 7.5*   Fasting Lipid Panel Recent Labs    10/23/17 0531  CHOL 162  HDL 42  LDLCALC 95  TRIG 126  CHOLHDL 3.9   Thyroid Function Tests No results for input(s): TSH, T4TOTAL, T3FREE, THYROIDAB in the last 72 hours.  Invalid input(s): FREET3  Telemetry    Ventricular paced atrial sensed  ECG    Ventricular paced  Radiology    Dg Chest 2 View  Result Date: 10/22/2017 CLINICAL DATA:  Weakness.  Not eating or drinking EXAM: CHEST - 2 VIEW COMPARISON:  10/04/2016 FINDINGS:  Streaky opacity at the right base. No edema or effusion. Normal heart size. Mild aortic tortuosity. Dual-chamber pacer leads from the left in stable position. IMPRESSION: Possible bronchopneumonia at the right base. Electronically Signed   By: Marnee SpringJonathon  Watts M.D.   On: 10/22/2017 19:13    Assessment & Plan    83 year old male with dementia, hypertension admitted with shortness of breath and possible bronchopneumonia.  Is been on IV antibiotics.  Has been on aspirin and Plavix.  Troponin increased to 15.  After discussion with the patient's son and himself, consideration for left heart cath has been raised today.  We will need to reassess this prior to the procedure as he appears somewhat toxic this morning.  Will check his renal function and attempt to diurese and make further assessment regarding proceeding with cath as he responds to this.  We will hold Plavix. Signed, Darlin PriestlyKenneth A. Juanice Warburton MD 10/25/2017, 7:59 AM  Pager: [phone]

## 2017-10-25 NOTE — Progress Notes (Signed)
Patient ID: Jesus [name] Pledger, male   DOB: Sep 07, 1934, 83 y.o.   MRN: [id number]  [employer] [facility] PROGRESS NOTE  Jesus [name] Laszlo [id number] DOB: Sep 07, 1934 DOA: 10/22/2017 PCP: Leotis ShamesSingh, Jasmine, MD  HPI/Subjective: Asked by nursing staff to come see the patient for decreased saturations.  Patient requiring more oxygen.  Patient on 50% Ventimask.  Patient really does not complain of too much.  States he is a little short of breath.  No chest pain this morning.  Objective: Vitals:   10/25/17 0728 10/25/17 0829  BP:  128/61  Pulse:  82  Resp:  (!) 22  Temp:    SpO2: (!) 85% 96%    Filed Weights   10/24/17 0540 10/24/17 1340 10/25/17 0521  Weight: 73.8 kg (162 lb 12.8 oz) 73.8 kg (162 lb 11.2 oz) 76.5 kg (168 lb 9.6 oz)    ROS: Review of Systems  Unable to perform ROS: Acuity of condition  Respiratory: Positive for shortness of breath.   Cardiovascular: Negative for chest pain.  Gastrointestinal: Negative for abdominal pain.   Exam: Physical Exam  Constitutional: He is oriented to person, place, and time.  HENT:  Nose: No mucosal edema.  Mouth/Throat: No oropharyngeal exudate or posterior oropharyngeal edema.  Eyes: Pupils are equal, round, and reactive to light. Conjunctivae and lids are normal.  Neck: No JVD present. Carotid bruit is not present. No edema present. No thyroid mass and no thyromegaly present.  Cardiovascular: S1 normal and S2 normal. Exam reveals no gallop.  No murmur heard. Pulses:      Dorsalis pedis pulses are 2+ on the right side, and 2+ on the left side.  Respiratory: Accessory muscle usage present. He has decreased breath sounds in the right middle field, the right lower field, the left middle field and the left lower field. He has wheezes in the right middle field, the right lower field and the left lower field. He has no rhonchi. He has no rales.  GI: Soft. Bowel sounds are normal. There is no tenderness.  Musculoskeletal:       Right ankle: He exhibits no  swelling.       Left ankle: He exhibits no swelling.  Lymphadenopathy:    He has no cervical adenopathy.  Neurological: He is alert and oriented to person, place, and time. No cranial nerve deficit.  Skin: Skin is warm. No rash noted. Nails show no clubbing.  Psychiatric: He has a normal mood and affect.      Data Reviewed: Basic Metabolic Panel: Recent Labs  Lab 10/22/17 1834 10/23/17 0531 10/24/17 0557 10/25/17 0613  NA 130* 132* 133* 131*  K 3.5 3.0* 3.8 3.9  CL 95* 99 101 102  CO2 23 21* 23 18*  GLUCOSE 294* 198* 235* 259*  BUN 33* 30* 40* 47*  CREATININE 1.54* 1.42* 1.88* 1.77*  CALCIUM 8.7* 7.8* 8.1* 7.9*   Liver Function Tests: Recent Labs  Lab 10/23/17 0531  AST 54*  ALT 24  ALKPHOS 53  BILITOT 0.8  PROT 5.7*  ALBUMIN 2.9*   CBC: Recent Labs  Lab 10/22/17 1834 10/23/17 0531 10/25/17 0613  WBC 15.1* 13.3* 11.7*  HGB 11.3* 10.0* 9.8*  HCT 31.2* 28.4* 27.8*  MCV 86.8 88.4 88.5  PLT 148* 138* 184   Cardiac Enzymes: Recent Labs  Lab 10/22/17 1834 10/23/17 0048 10/24/17 0557  TROPONINI 5.71* 4.22* 15.57*    CBG: Recent Labs  Lab 10/24/17 0729 10/24/17 1105 10/24/17 1753 10/24/17 2117 10/25/17 0814  GLUCAP 219* 333* 329* 258*  318*    Recent Results (from the past 240 hour(s))  Blood culture (routine x 2)     Status: None (Preliminary result)   Collection Time: 10/22/17  8:36 PM  Result Value Ref Range Status   Specimen Description BLOOD RIGHT ANTECUBITAL  Final   Special Requests   Final    BOTTLES DRAWN AEROBIC AND ANAEROBIC Blood Culture adequate volume   Culture   Final    NO GROWTH 3 DAYS Performed at [facility], [address], [place], [place] [address]    Report Status PENDING  Incomplete  Blood culture (routine x 2)     Status: None (Preliminary result)   Collection Time: 10/22/17  8:36 PM  Result Value Ref Range Status   Specimen Description BLOOD RIGHT ANTECUBITAL  Final   Special Requests   Final     BOTTLES DRAWN AEROBIC AND ANAEROBIC Blood Culture adequate volume   Culture   Final    NO GROWTH 3 DAYS Performed at [facility], [address], [place], [place] [address]    Report Status PENDING  Incomplete      Scheduled Meds: . amLODipine  10 mg Oral Daily  . aspirin  81 mg Oral Daily  . atorvastatin  40 mg Oral q1800  . budesonide (PULMICORT) nebulizer solution  0.5 mg Nebulization BID  . docusate sodium  100 mg Oral BID  . donepezil  10 mg Oral QHS  . insulin aspart  0-5 Units Subcutaneous QHS  . insulin aspart  0-9 Units Subcutaneous TID WC  . insulin glargine  10 Units Subcutaneous Daily  . ipratropium-albuterol  3 mL Nebulization Q6H  . ipratropium-albuterol      . lisinopril  40 mg Oral Daily  . mouth rinse  15 mL Mouth Rinse BID  . memantine  5 mg Oral BID  . methylPREDNISolone (SOLU-MEDROL) injection  40 mg Intravenous Daily  . metoprolol succinate  25 mg Oral Daily  . multivitamin with minerals  1 tablet Oral Daily  . sodium chloride flush  3 mL Intravenous Q12H  . tamsulosin  0.8 mg Oral QHS   Continuous Infusions: . sodium chloride    . sodium chloride    . azithromycin Stopped (10/25/17 0744)  . cefTRIAXone (ROCEPHIN)  IV Stopped (10/25/17 0618)  . heparin 1,250 Units/hr (10/25/17 0513)    Assessment/Plan:  1. Acute hypoxic respiratory failure.  Patient placed on Ventimask.  Stop IV fluids.  1 dose of Lasix now in case fluid overload.  Also start Solu-Medrol 40 mg IV daily in case of COPD exacerbation.  Chest x-ray ordered.   2. Pneumonia, prostate infection.  On Rocephin and Zithromax.  Urine culture pending.  Continue nebulizer treatments.  Upper airway congestion. 3. NSTEMI.  Troponin 15.  Cardiology to decide on cardiac cath today if able to lie flat for procedure..  Continue aspirin, toprol XL and atorvastatin.  Plavix held.  Echocardiogram showed normal EF. 4. Essential hypertension on Norvasc, Toprol, lisinopril 5. History of dementia  without behavioral disturbance on Aricept 6. Type 2 diabetes mellitus.  Lantus10 units daily.  Sliding scale insulin.  Hemoglobin A1c added on. 7. BPH on Flomax 8. Hypokalemia replaced 9. Acute kidney chronic kidney disease stage III monitor closely.  Fluid stopped with fluid overload.  Watch kidney function closely.  Code Status:     Code Status Orders  (From admission, onward)        Start     Ordered   10/22/17 2335  Full code  Continuous     10/22/17 2334    Code Status History    This patient has a current code status but no historical code status.    Advance Directive Documentation     Most Recent Value  Type of Advance Directive  Healthcare Power of Attorney  Pre-existing out of facility DNR order (yellow form or pink MOST form)  -  "MOST" Form in Place?  -     Family Communication: Brother on the phone Disposition Plan: To be determined based on clinical course  Consultants:  Cardiology  Antibiotics:  Rocephin  Zithromax  Time spent: 32 minutes.  Case discussed with nursing staff.  Raesha Coonrod [employer]

## 2017-10-25 NOTE — Progress Notes (Signed)
Increase to 4l after neb treatment

## 2017-10-25 NOTE — Progress Notes (Signed)
Attempted to wean patient of Venti Mask at 50% but was unsuccesfull Patients SPO2 dropped to 86%.

## 2017-10-25 NOTE — Progress Notes (Signed)
Morrie SheldonAshley, RN had attempted to wean patient back to 4l [place] but patients sats dropped into the 80's. Patient was placed back on 50% ventrui mask.

## 2017-10-25 NOTE — Consult Note (Signed)
ANTICOAGULATION CONSULT NOTE - Initial Consult  Pharmacy Consult for heparin drip Indication: chest pain/ACS  No Known Allergies  Patient Measurements: Height: 5\' 7"  (170.2 cm) Weight: 168 lb 9.6 oz (76.5 kg) IBW/kg (Calculated) : 66.1 Heparin Dosing Weight: 75.3kg  Vital Signs: Temp: 97.6 F (36.4 C) (07/11 1611) Temp Source: Oral (07/11 1611) BP: 108/70 (07/11 1611) Pulse Rate: 69 (07/11 1611)  Labs: Recent Labs    10/22/17 1834 10/22/17 1835 10/23/17 0048  10/23/17 0531 10/24/17 0557 10/24/17 2014 10/25/17 0613 10/25/17 1741  HGB 11.3*  --   --   --  10.0*  --   --  9.8*  --   HCT 31.2*  --   --   --  28.4*  --   --  27.8*  --   PLT 148*  --   --   --  138*  --   --  184  --   APTT  --  34  --   --   --   --   --   --   --   LABPROT  --  13.4  --   --   --   --   --   --   --   INR  --  1.03  --   --   --   --   --   --   --   HEPARINUNFRC  --   --   --    < > 0.10*  --  0.17* 0.30 0.59  CREATININE 1.54*  --   --   --  1.42* 1.88*  --  1.77*  --   TROPONINI 5.71*  --  4.22*  --   --  15.57*  --   --   --    < > = values in this interval not displayed.    Estimated Creatinine Clearance: 30.6 mL/min (A) (by C-G formula based on SCr of 1.77 mg/dL (H)).   Medical History: Past Medical History:  Diagnosis Date  . Diabetes mellitus without complication (HCC)   . Hypertension     Medications:  Scheduled:  . amLODipine  10 mg Oral Daily  . aspirin  81 mg Oral Daily  . atorvastatin  40 mg Oral q1800  . [START ON 10/26/2017] azithromycin  250 mg Oral Daily  . budesonide (PULMICORT) nebulizer solution  0.5 mg Nebulization BID  . docusate sodium  100 mg Oral BID  . donepezil  10 mg Oral QHS  . [START ON 10/26/2017] furosemide  40 mg Intravenous Daily  . insulin aspart  0-5 Units Subcutaneous QHS  . insulin aspart  0-9 Units Subcutaneous TID WC  . insulin aspart  3 Units Subcutaneous TID WC  . insulin glargine  14 Units Subcutaneous Daily  .  ipratropium-albuterol  3 mL Nebulization Q6H  . lisinopril  40 mg Oral Daily  . mouth rinse  15 mL Mouth Rinse BID  . memantine  5 mg Oral BID  . methylPREDNISolone (SOLU-MEDROL) injection  40 mg Intravenous Daily  . metoprolol succinate  25 mg Oral Daily  . multivitamin with minerals  1 tablet Oral Daily  . sodium chloride flush  3 mL Intravenous Q12H  . tamsulosin  0.8 mg Oral QHS    Assessment: Patient is a 83 year old male who presents with weakness found to have elevated troponin. Pt was started on heparin drip in ER but was d/c by cardiology the next AM since pt was not a cardiac  cath candidate and pt complains of no chest pain. Troponin continues to rise-up to 15.57 this AM. Pharmacy consulted to restart heparin drip.   Goal of Therapy:  Heparin level 0.3-0.7 units/ml Monitor platelets by anticoagulation protocol: Yes   Plan:  Heparin level just therapeutic at 0.30. Pt troponin up to 15.57. On the schedule for cardiac cath this afternoon, but pt currently confused and hypoxic. This is the first therapeutic level. Would like to make sure pt remains therapeutic, therefore I am choosing to increase rate slightly to 1350 units/hr. RN made aware. Will recheck level in 8 hours  07/11: 1741. Heparin level remains therapeutic and is further into the therapeutic range after having increased the rate to 1350 units/hr. Will maintain current rate and recheck in 8 hours since infusion rate was changed  Burnis Medinodney Eshan Trupiano, Pharm.D Clinical Pharmacist 10/25/2017,6:12 PM

## 2017-10-25 NOTE — Progress Notes (Signed)
When rounding patient having audible wheezing without ausculation. Appears to be worse than prior assessment. IV fluids stopped.MD notified. Patient in no distress. Will monitor

## 2017-10-25 NOTE — Care Management Important Message (Signed)
Copy of signed IM left with patient in room.  

## 2017-10-25 NOTE — Progress Notes (Signed)
PT Hold Note  Patient Details Name: Jesus [name] MRN: [id number] DOB: 09-25-1934   Evaluation Hold:    Reason Eval/Treat Not Completed: Medical issues which prohibited therapy. Chart reviewed. Plan is for cardiac cath today. Per cardiology note from this AM pt has been incontinent this AM, more confused, and requiring increased oxygen. Will currently hold evaluation until pt has stabilized and cardiac cath has been performed if it is decided to proceed with this plan.   Sharalyn InkJason D Lonnel Gjerde PT, DPT, GCS  Dnya Hickle 10/25/2017, 8:12 AM

## 2017-10-25 NOTE — Progress Notes (Signed)
Inpatient Diabetes Program Recommendations  AACE/ADA: New Consensus Statement on Inpatient Glycemic Control (2015)  Target Ranges:  Prepandial:   less than 140 mg/dL      Peak postprandial:   less than 180 mg/dL (1-2 hours)      Critically ill patients:  140 - 180 mg/dL   Lab Results  Component Value Date   GLUCAP 318 (H) 10/25/2017   HGBA1C 7.5 (H) 10/24/2017    Review of Glycemic ControlResults for Jesus [name], Jesus [name] (MRN [id number]) as of 10/25/2017 09:18  Ref. Range 10/24/2017 07:29 10/24/2017 11:05 10/24/2017 17:53 10/24/2017 21:17 10/25/2017 08:14  Glucose-Capillary Latest Ref Range: 70 - 99 mg/dL 409219 (H) 811333 (H) 914329 (H) 258 (H) 318 (H)   Diabetes history: Type 2 DM Outpatient Diabetes medications: Actos 30 mg daily, Glucotrol 20 mg bid Current orders for Inpatient glycemic control:  Novolog sensitive tid with meals and HS, Lantus 10 units daily Solumedrol 40 mg daily Inpatient Diabetes Program Recommendations:    A1C indicates fairly well controlled DM prior to admit.  Consider adding Novolog meal coverage 3 units tid with meals and increase Lantus to 14 units while in the hospital.   Thanks,  Jesus MeagerJenny Rahul Malinak, RN, BC-ADM Inpatient Diabetes Coordinator Pager [phone] (8a-5p)

## 2017-10-25 NOTE — Consult Note (Signed)
ANTICOAGULATION CONSULT NOTE - Initial Consult  Pharmacy Consult for heparin drip Indication: chest pain/ACS  No Known Allergies  Patient Measurements: Height: 5\' 7"  (170.2 cm) Weight: 168 lb 9.6 oz (76.5 kg) IBW/kg (Calculated) : 66.1 Heparin Dosing Weight: 75.3kg  Vital Signs: Temp: 99 F (37.2 C) (07/11 0521) Temp Source: Oral (07/11 0521) BP: 128/61 (07/11 0829) Pulse Rate: 82 (07/11 0829)  Labs: Recent Labs    10/22/17 1834 10/22/17 1835 10/23/17 0048 10/23/17 0531 10/24/17 0557 10/24/17 2014 10/25/17 0613  HGB 11.3*  --   --  10.0*  --   --  9.8*  HCT 31.2*  --   --  28.4*  --   --  27.8*  PLT 148*  --   --  138*  --   --  184  APTT  --  34  --   --   --   --   --   LABPROT  --  13.4  --   --   --   --   --   INR  --  1.03  --   --   --   --   --   HEPARINUNFRC  --   --   --  0.10*  --  0.17* 0.30  CREATININE 1.54*  --   --  1.42* 1.88*  --  1.77*  TROPONINI 5.71*  --  4.22*  --  15.57*  --   --     Estimated Creatinine Clearance: 30.6 mL/min (A) (by C-G formula based on SCr of 1.77 mg/dL (H)).   Medical History: Past Medical History:  Diagnosis Date  . Diabetes mellitus without complication (HCC)   . Hypertension     Medications:  Scheduled:  . amLODipine  10 mg Oral Daily  . aspirin  81 mg Oral Daily  . atorvastatin  40 mg Oral q1800  . budesonide (PULMICORT) nebulizer solution  0.5 mg Nebulization BID  . docusate sodium  100 mg Oral BID  . donepezil  10 mg Oral QHS  . insulin aspart  0-5 Units Subcutaneous QHS  . insulin aspart  0-9 Units Subcutaneous TID WC  . insulin glargine  10 Units Subcutaneous Daily  . ipratropium-albuterol  3 mL Nebulization Q6H  . ipratropium-albuterol      . lisinopril  40 mg Oral Daily  . mouth rinse  15 mL Mouth Rinse BID  . memantine  5 mg Oral BID  . methylPREDNISolone (SOLU-MEDROL) injection  40 mg Intravenous Daily  . metoprolol succinate  25 mg Oral Daily  . multivitamin with minerals  1 tablet Oral Daily   . sodium chloride flush  3 mL Intravenous Q12H  . tamsulosin  0.8 mg Oral QHS    Assessment: Patient is a 83 year old male who presents with weakness found to have elevated troponin. Pt was started on heparin drip in ER but was d/c by cardiology the next AM since pt was not a cardiac cath candidate and pt complains of no chest pain. Troponin continues to rise-up to 15.57 this AM. Pharmacy consulted to restart heparin drip.   Goal of Therapy:  Heparin level 0.3-0.7 units/ml Monitor platelets by anticoagulation protocol: Yes   Plan:  Heparin level just therapeutic at 0.30. Pt troponin up to 15.57. On the schedule for cardiac cath this afternoon, but pt currently confused and hypoxic. This is the first therapeutic level. Would like to make sure pt remains therapeutic, therefore I am choosing to increase rate slightly to 1350  units/hr. RN made aware. Will recheck level in 8 hours  Durenda Pechacek D Bhavesh Vazquez, Pharm.D, BCPS Clinical Pharmacist 10/25/2017,8:44 AM

## 2017-10-26 ENCOUNTER — Inpatient Hospital Stay: Payer: Medicare Other

## 2017-10-26 ENCOUNTER — Encounter
Admission: EM | Disposition: A | Discharge status: Home Health | Payer: Self-pay | Source: Home / Self Care | Attending: Internal Medicine

## 2017-10-26 ENCOUNTER — Encounter: Payer: Self-pay | Admitting: *Deleted

## 2017-10-26 DIAGNOSIS — J181 Lobar pneumonia, unspecified organism: Secondary | ICD-10-CM

## 2017-10-26 DIAGNOSIS — I214 Non-ST elevation (NSTEMI) myocardial infarction: Principal | ICD-10-CM

## 2017-10-26 DIAGNOSIS — J9601 Acute respiratory failure with hypoxia: Secondary | ICD-10-CM

## 2017-10-26 HISTORY — PX: LEFT HEART CATH AND CORONARY ANGIOGRAPHY: CATH118249

## 2017-10-26 LAB — CBC
HEMATOCRIT: 26.3 % — AB (ref 40.0–52.0)
HEMOGLOBIN: 9.2 g/dL — AB (ref 13.0–18.0)
MCH: 31.2 pg (ref 26.0–34.0)
MCHC: 35.1 g/dL (ref 32.0–36.0)
MCV: 89.1 fL (ref 80.0–100.0)
Platelets: 201 10*3/uL (ref 150–440)
RBC: 2.95 MIL/uL — AB (ref 4.40–5.90)
RDW: 14.5 % (ref 11.5–14.5)
WBC: 14.4 10*3/uL — ABNORMAL HIGH (ref 3.8–10.6)

## 2017-10-26 LAB — BLOOD GAS, ARTERIAL
ALLENS TEST (PASS/FAIL): POSITIVE — AB
Acid-base deficit: 0.1 mmol/L (ref 0.0–2.0)
Bicarbonate: 22.3 mmol/L (ref 20.0–28.0)
DELIVERY SYSTEMS: POSITIVE
EXPIRATORY PAP: 6
FIO2: 40
Inspiratory PAP: 12
O2 SAT: 93.9 %
PCO2 ART: 28 mmHg — AB (ref 32.0–48.0)
PH ART: 7.51 — AB (ref 7.350–7.450)
Patient temperature: 37
pO2, Arterial: 63 mmHg — ABNORMAL LOW (ref 83.0–108.0)

## 2017-10-26 LAB — GLUCOSE, CAPILLARY
GLUCOSE-CAPILLARY: 239 mg/dL — AB (ref 70–99)
Glucose-Capillary: 286 mg/dL — ABNORMAL HIGH (ref 70–99)
Glucose-Capillary: 287 mg/dL — ABNORMAL HIGH (ref 70–99)
Glucose-Capillary: 229 mg/dL — ABNORMAL HIGH (ref 70–99)
Glucose-Capillary: 175 mg/dL — ABNORMAL HIGH (ref 70–99)

## 2017-10-26 LAB — BASIC METABOLIC PANEL
Anion gap: 11 (ref 5–15)
BUN: 62 mg/dL — AB (ref 8–23)
CHLORIDE: 104 mmol/L (ref 98–111)
CO2: 21 mmol/L — AB (ref 22–32)
CREATININE: 1.57 mg/dL — AB (ref 0.61–1.24)
Calcium: 8.4 mg/dL — ABNORMAL LOW (ref 8.9–10.3)
GFR calc Af Amer: 46 mL/min — ABNORMAL LOW (ref >60)
GFR calc non Af Amer: 40 mL/min — ABNORMAL LOW (ref >60)
Glucose, Bld: 264 mg/dL — ABNORMAL HIGH (ref 70–99)
POTASSIUM: 4.1 mmol/L (ref 3.5–5.1)
Sodium: 136 mmol/L (ref 135–145)

## 2017-10-26 LAB — PROCALCITONIN: Procalcitonin: 0.97 ng/mL

## 2017-10-26 LAB — BRAIN NATRIURETIC PEPTIDE: B NATRIURETIC PEPTIDE 5: 1239 pg/mL — AB (ref 0.0–100.0)

## 2017-10-26 LAB — HEPARIN LEVEL (UNFRACTIONATED): HEPARIN UNFRACTIONATED: 0.21 [IU]/mL — AB (ref 0.30–0.70)

## 2017-10-26 LAB — MRSA PCR SCREENING: MRSA by PCR: POSITIVE — AB

## 2017-10-26 SURGERY — LEFT HEART CATH AND CORONARY ANGIOGRAPHY
Anesthesia: Moderate Sedation

## 2017-10-26 MED ORDER — MUPIROCIN 2 % EX OINT
1.0000 "application " | TOPICAL_OINTMENT | Freq: Two times a day (BID) | CUTANEOUS | Status: DC
  Administered 2017-10-26 – 2017-10-29 (×5): 1 via NASAL
  Filled 2017-10-26: qty 22

## 2017-10-26 MED ORDER — HEPARIN (PORCINE) IN NACL 1000-0.9 UT/500ML-% IV SOLN
INTRAVENOUS | Status: AC
  Filled 2017-10-26: qty 500

## 2017-10-26 MED ORDER — SODIUM CHLORIDE 0.9% FLUSH
3.0000 mL | Freq: Two times a day (BID) | INTRAVENOUS | Status: DC
  Administered 2017-10-26 – 2017-10-29 (×5): 3 mL via INTRAVENOUS

## 2017-10-26 MED ORDER — HEPARIN BOLUS VIA INFUSION
1100.0000 [IU] | Freq: Once | INTRAVENOUS | Status: AC
  Administered 2017-10-26: 1100 [IU] via INTRAVENOUS
  Filled 2017-10-26: qty 1100

## 2017-10-26 MED ORDER — ISOSORBIDE MONONITRATE ER 30 MG PO TB24
30.0000 mg | ORAL_TABLET | Freq: Every day | ORAL | Status: DC
  Administered 2017-10-26 – 2017-10-29 (×4): 30 mg via ORAL
  Filled 2017-10-26 (×4): qty 1

## 2017-10-26 MED ORDER — IOPAMIDOL (ISOVUE-300) INJECTION 61%
INTRAVENOUS | Status: DC | PRN
  Administered 2017-10-26: 80 mL via INTRA_ARTERIAL

## 2017-10-26 MED ORDER — FUROSEMIDE 10 MG/ML IJ SOLN
80.0000 mg | Freq: Once | INTRAMUSCULAR | Status: AC
  Administered 2017-10-26: 80 mg via INTRAVENOUS

## 2017-10-26 MED ORDER — ENOXAPARIN SODIUM 40 MG/0.4ML ~~LOC~~ SOLN
40.0000 mg | SUBCUTANEOUS | Status: DC
  Administered 2017-10-26 – 2017-10-28 (×3): 40 mg via SUBCUTANEOUS
  Filled 2017-10-26 (×3): qty 0.4

## 2017-10-26 MED ORDER — CHLORHEXIDINE GLUCONATE CLOTH 2 % EX PADS
6.0000 | MEDICATED_PAD | Freq: Every day | CUTANEOUS | Status: DC
  Administered 2017-10-27 – 2017-10-29 (×3): 6 via TOPICAL

## 2017-10-26 MED ORDER — FUROSEMIDE 10 MG/ML IJ SOLN: 40.0000 mg | Freq: Two times a day (BID) | INTRAMUSCULAR | Status: DC

## 2017-10-26 MED ORDER — ACETAMINOPHEN 325 MG PO TABS: 650.0000 mg | ORAL_TABLET | ORAL | Status: DC | PRN

## 2017-10-26 MED ORDER — CLOPIDOGREL BISULFATE 75 MG PO TABS
75.0000 mg | ORAL_TABLET | Freq: Every day | ORAL | Status: DC
  Administered 2017-10-26 – 2017-10-29 (×4): 75 mg via ORAL
  Filled 2017-10-26 (×4): qty 1

## 2017-10-26 MED ORDER — INSULIN ASPART 100 UNIT/ML ~~LOC~~ SOLN
SUBCUTANEOUS | Status: AC
  Filled 2017-10-26: qty 1

## 2017-10-26 MED ORDER — ONDANSETRON HCL 4 MG/2ML IJ SOLN: 4.0000 mg | Freq: Four times a day (QID) | INTRAMUSCULAR | Status: DC | PRN

## 2017-10-26 MED ORDER — SODIUM CHLORIDE 0.9% FLUSH: 3.0000 mL | INTRAVENOUS | Status: DC | PRN

## 2017-10-26 MED ORDER — INSULIN GLARGINE 100 UNIT/ML ~~LOC~~ SOLN
17.0000 [IU] | Freq: Every day | SUBCUTANEOUS | Status: DC
  Administered 2017-10-26 – 2017-10-28 (×3): 17 [IU] via SUBCUTANEOUS
  Filled 2017-10-26 (×6): qty 0.17

## 2017-10-26 MED ORDER — SODIUM CHLORIDE 0.9 % IV SOLN: 250.0000 mL | INTRAVENOUS | Status: DC | PRN

## 2017-10-26 MED ORDER — SODIUM CHLORIDE 0.9 % IV SOLN: INTRAVENOUS | Status: DC

## 2017-10-26 MED ORDER — LIDOCAINE HCL (PF) 1 % IJ SOLN
INTRAMUSCULAR | Status: AC
  Filled 2017-10-26: qty 30

## 2017-10-26 SURGICAL SUPPLY — 9 items
CATH INFINITI 5FR ANG PIGTAIL (CATHETERS) ×3 IMPLANT
CATH INFINITI 5FR JL4 (CATHETERS) ×3 IMPLANT
CATH INFINITI JR4 5F (CATHETERS) ×6 IMPLANT
DEVICE CLOSURE MYNXGRIP 5F (Vascular Products) ×3 IMPLANT
KIT MANI 3VAL PERCEP (MISCELLANEOUS) ×3 IMPLANT
NEEDLE PERC 18GX7CM (NEEDLE) ×3 IMPLANT
PACK CARDIAC CATH (CUSTOM PROCEDURE TRAY) ×3 IMPLANT
SHEATH AVANTI 5FR X 11CM (SHEATH) ×3 IMPLANT
WIRE GUIDERIGHT .035X150 (WIRE) ×3 IMPLANT

## 2017-10-26 NOTE — Progress Notes (Signed)
Cardiac cath completed this am with no immediate complications. Revealed LM and rca disease. Not candidate for pci. Would need cabg for revascularization. Long discussion with patients brother. Pt is fairly funcitonal and is able to go dancing 3 times a week. Has not been having chest pain with activity prior to admission for pneumonia. Will give iv lasix post cath due to LVEDP of near 30. WIll attempt medical management and consider cabg only if he fails this as he is high risk for cabg and recovery from this. He does not drive and lives with his brother. Will resume plavix and add long actining nitrates. Conitnue to attempt to improve lungs. May need oxygen at least temporarily at discharge.

## 2017-10-26 NOTE — Progress Notes (Signed)
PT Cancellation Note  Patient Details Name: Jesus [name] MRN: [id number] DOB: 27-Nov-1934   Cancelled Treatment:      Medical issues which prohibited therapy. Chart reviewed. Plan is for cardiac cath when pt stable. Per cardiology note from yesterday plan is to perform cardiac cath when pt more medically stable. PT has attempted 3 times and plans to complete order at this time. Please indicate need for further PT services through new order when pt more medically appropriate.   Ron AgeeLogan Sheyla Zaffino, SPT   Libby Goehring 10/26/2017, 8:18 AM

## 2017-10-26 NOTE — Progress Notes (Signed)
ANTICOAGULATION CONSULT NOTE - Initial Consult  Pharmacy Consult for Heparin  Indication: chest pain/ACS  No Known Allergies  Patient Measurements: Height: 5\' 7"  (170.2 cm) Weight: 170 lb 4.8 oz (77.2 kg) IBW/kg (Calculated) : 66.1 Heparin Dosing Weight:  75.3 kg   Vital Signs: Temp: 98 F (36.7 C) (07/12 0246) Temp Source: Axillary (07/12 0246) BP: 133/64 (07/12 0246) Pulse Rate: 79 (07/12 0246)  Labs: Recent Labs    10/23/17 0531 10/24/17 0557  10/25/17 0613 10/25/17 1741 10/26/17 0359  HGB 10.0*  --   --  9.8*  --  9.2*  HCT 28.4*  --   --  27.8*  --  26.3*  PLT 138*  --   --  184  --  201  HEPARINUNFRC 0.10*  --    < > 0.30 0.59 0.21*  CREATININE 1.42* 1.88*  --  1.77*  --  1.57*  TROPONINI  --  15.57*  --   --   --   --    < > = values in this interval not displayed.    Estimated Creatinine Clearance: 34.5 mL/min (A) (by C-G formula based on SCr of 1.57 mg/dL (H)).   Medical History: Past Medical History:  Diagnosis Date  . Diabetes mellitus without complication (HCC)   . Hypertension     Medications:  Medications Prior to Admission  Medication Sig Dispense Refill Last Dose  . amLODipine (NORVASC) 10 MG tablet Take 10 mg by mouth daily.   10/22/2017 at Unknown time  . aspirin 81 MG chewable tablet Chew 81 mg by mouth daily.   10/22/2017 at Unknown time  . azelastine (ASTELIN) 0.1 % nasal spray Place 1 spray into both nostrils 2 (two) times daily. Use in each nostril as directed   PRN at PRN  . donepezil (ARICEPT) 10 MG tablet Take 10 mg by mouth at bedtime.   10/21/2017 at Unknown time  . glipiZIDE (GLUCOTROL) 10 MG tablet Take 20 mg by mouth 2 (two) times daily before a meal.   10/22/2017 at Unknown time  . lisinopril (PRINIVIL,ZESTRIL) 40 MG tablet Take 40 mg by mouth daily.   10/22/2017 at Unknown time  . memantine (NAMENDA) 5 MG tablet Take 5 mg by mouth 2 (two) times daily.   10/22/2017 at Unknown time  . Multiple Vitamins-Minerals (MULTIVITAMIN WITH MINERALS)  tablet Take 1 tablet by mouth daily.   10/22/2017 at Unknown time  . omega-3 acid ethyl esters (LOVAZA) 1 g capsule Take 2 g by mouth daily.   UNKNOWN at UNKNOWN  . pioglitazone (ACTOS) 30 MG tablet Take 30 mg by mouth daily.   10/22/2017 at Unknown time  . Potassium 99 MG TABS Take 1 tablet by mouth daily.   10/22/2017 at Unknown time  . tamsulosin (FLOMAX) 0.4 MG CAPS capsule Take 0.8 mg by mouth at bedtime.   10/21/2017 at Unknown time    Assessment: Patient is a 83 year old male who presents with weakness found to have elevated troponin. Pt was started on heparin drip in ER but was d/c by cardiology the next AM since pt was not a cardiac cath candidate and pt complains of no chest pain. Troponin continues to rise-up to 15.57 this AM. Pharmacy consulted to restart heparin drip.    Goal of Therapy:  Heparin level 0.3-0.7 units/ml Monitor platelets by anticoagulation protocol: Yes   Plan:   Heparin level just therapeutic at 0.30. Pt troponin up to 15.57. On the schedule for cardiac cath this afternoon, but  pt currently confused and hypoxic. This is the first therapeutic level. Would like to make sure pt remains therapeutic, therefore I am choosing to increase rate slightly to 1350 units/hr. RN made aware. Will recheck level in 8 hours  07/11: 1741. Heparin level remains therapeutic and is further into the therapeutic range after having increased the rate to 1350 units/hr. Will maintain current rate and recheck in 8 hours since infusion rate was changed  7/11: HL @ 0400 = 0.21 Will order Heparin 1100 units IV X 1 bolus and increase drip rate to 1500 units/hr.  Will recheck HL 8 hrs after rate change.     Breiana Stratmann D 10/26/2017,4:44 AM

## 2017-10-26 NOTE — Progress Notes (Signed)
Cardiologist asked RN to lower O2 to 4L and monitor pulse ox.  RN had already put a pulse ox on the patient.  Patient was 96% on 15L.  Christean GriefShylon M Eugene Isadore, RN

## 2017-10-26 NOTE — Consult Note (Signed)
PULMONARY / CRITICAL CARE MEDICINE   Name: Jesus [name] MRN: [id number] DOB: [birth year]    ADMISSION DATE:  10/22/2017 CONSULTATION DATE:  7/12  REFERRING MD:  Hilton Sinclair  PT PROFILE: 83 y.o. M adm 07/08 with weakness and malaise.  Troponin I was elevated consistent with non-STEMI.  Was also felt to have pneumonia, treated as CAP.  Underwent cardiac catheterization 7/12 with finding of severe three-vessel disease.  Postprocedure, developed progressive respiratory distress.  Transferred to SDU for BiPAP.  HISTORY OF PRESENT ILLNESS:   Level 5 caveat due to BiPAP  PAST MEDICAL HISTORY :  He  has a past medical history of Diabetes mellitus without complication (HCC) and Hypertension.  PAST SURGICAL HISTORY: He  has a past surgical history that includes Appendectomy and LEFT HEART CATH AND CORONARY ANGIOGRAPHY (N/A, 10/26/2017).  No Known Allergies  No current facility-administered medications on file prior to encounter.    Current Outpatient Medications on File Prior to Encounter  Medication Sig  . amLODipine (NORVASC) 10 MG tablet Take 10 mg by mouth daily.  Marland Kitchen aspirin 81 MG chewable tablet Chew 81 mg by mouth daily.  Marland Kitchen azelastine (ASTELIN) 0.1 % nasal spray Place 1 spray into both nostrils 2 (two) times daily. Use in each nostril as directed  . donepezil (ARICEPT) 10 MG tablet Take 10 mg by mouth at bedtime.  Marland Kitchen glipiZIDE (GLUCOTROL) 10 MG tablet Take 20 mg by mouth 2 (two) times daily before a meal.  . lisinopril (PRINIVIL,ZESTRIL) 40 MG tablet Take 40 mg by mouth daily.  . memantine (NAMENDA) 5 MG tablet Take 5 mg by mouth 2 (two) times daily.  . Multiple Vitamins-Minerals (MULTIVITAMIN WITH MINERALS) tablet Take 1 tablet by mouth daily.  Marland Kitchen omega-3 acid ethyl esters (LOVAZA) 1 g capsule Take 2 g by mouth daily.  . pioglitazone (ACTOS) 30 MG tablet Take 30 mg by mouth daily.  . Potassium 99 MG TABS Take 1 tablet by mouth daily.  . tamsulosin (FLOMAX) 0.4 MG CAPS capsule Take 0.8 mg by  mouth at bedtime.    FAMILY HISTORY:  His family history is not on file.  SOCIAL HISTORY: He  reports that he has never smoked. He has never used smokeless tobacco. He reports that he does not drink alcohol or use drugs.  REVIEW OF SYSTEMS:   Level 5 caveat  SUBJECTIVE:  Appears comfortable on BiPAP.  Alert and able to follow basic commands.  VITAL SIGNS: BP 124/82   Pulse 70   Temp (P) 98.2 F (36.8 C)   Resp 17   Ht 5\' 7"  (1.702 m)   Wt 170 lb (77.1 kg)   SpO2 95%   BMI 26.63 kg/m   HEMODYNAMICS:    VENTILATOR SETTINGS: FiO2 (%):  [40 %] (P) 40 %  INTAKE / OUTPUT: I/O last 3 completed shifts: In: -  Out: 1100 [Urine:1100]  PHYSICAL EXAMINATION: General: On BiPAP, NAD Neuro: No focal deficits HEENT: NCAT, sclerae white Cardiovascular: Regular, no M Lungs: Right-sided crackles, no wheezes Abdomen: Soft, NT, NABS Extremities: Warm, no edema Skin: No lesions noted  LABS:  BMET Recent Labs  Lab 10/24/17 0557 10/25/17 0613 10/26/17 0359  NA 133* 131* 136  K 3.8 3.9 4.1  CL 101 102 104  CO2 23 18* 21*  BUN 40* 47* 62*  CREATININE 1.88* 1.77* 1.57*  GLUCOSE 235* 259* 264*    Electrolytes Recent Labs  Lab 10/24/17 0557 10/25/17 0613 10/26/17 0359  CALCIUM 8.1* 7.9* 8.4*    CBC Recent  Labs  Lab 10/23/17 0531 10/25/17 0613 10/26/17 0359  WBC 13.3* 11.7* 14.4*  HGB 10.0* 9.8* 9.2*  HCT 28.4* 27.8* 26.3*  PLT 138* 184 201    Coag's Recent Labs  Lab 10/22/17 1835  APTT 34  INR 1.03    Sepsis Markers Recent Labs  Lab 10/22/17 2021  LATICACIDVEN 1.8    ABG No results for input(s): PHART, PCO2ART, PO2ART in the last 168 hours.  Liver Enzymes Recent Labs  Lab 10/23/17 0531  AST 54*  ALT 24  ALKPHOS 53  BILITOT 0.8  ALBUMIN 2.9*    Cardiac Enzymes Recent Labs  Lab 10/22/17 1834 10/23/17 0048 10/24/17 0557  TROPONINI 5.71* 4.22* 15.57*    Glucose Recent Labs  Lab 10/25/17 1651 10/25/17 2040 10/26/17 0810  10/26/17 0923 10/26/17 1130 10/26/17 1618  GLUCAP 373* 312* 286* 287* 229* 175*    CXR: RUL consolidation with probable R effusion   ASSESSMENT / PLAN: Acute hypoxemic respiratory failure Probable RUL CAP with parapneumonic effusion Doubt CXR findings represent pulmonary edema Severe three-vessel coronary disease.  Medical management for now AKI, creatinine improving Advanced dementia  BiPAP changed to PRN  Agree with current antibiotics Discontinue scheduled bronchodilators Continue albuterol as needed for wheezing Discontinue further diuresis Recheck CXR in a.m. PCT protocol ordered Check BNP in a.m.  Billy Fischeravid Simonds, MD PCCM service Mobile [phone] Pager [phone] 10/26/2017 5:31 PM

## 2017-10-26 NOTE — Care Management (Signed)
Patient had cardiac cath today and will attempt medical management and may need to consider cabg depending on response to treatment.  Patient has been on 6 liters of oxygen today and at present is bing placed on bipap and received dose of iv lasix. Lives with his brother.  Patient does have dementia

## 2017-10-26 NOTE — Progress Notes (Signed)
Inpatient Diabetes Program Recommendations  AACE/ADA: New Consensus Statement on Inpatient Glycemic Control (2019)  Target Ranges:  Prepandial:   less than 140 mg/dL      Peak postprandial:   less than 180 mg/dL (1-2 hours)      Critically ill patients:  140 - 180 mg/dL   Results for Jesus [name], Jesus [name] (MRN [id number]) as of 10/26/2017 08:50  Ref. Range 10/25/2017 08:14 10/25/2017 11:44 10/25/2017 16:51 10/25/2017 20:40 10/26/2017 08:10  Glucose-Capillary Latest Ref Range: 70 - 99 mg/dL 528318 (H) 413260 (H) 244373 (H) 312 (H) 286 (H)   Review of Glycemic Control  Diabetes history: DM2 Outpatient Diabetes medications: Glipizide 20 mg BID, Actos 30 mg daily  Current orders for Inpatient glycemic control: Lantus 14 units daily, Novolog 3 units TID with meals for meal coverage, Novolog 0-9 units TID with meals, Novolog 0-5 units QHS; Solumedrol 40 mg daily  Inpatient Diabetes Program Recommendations:  Insulin - Basal: If steroids are continued, please consider increasing Lantus to 17 units daily. Insulin - Meal Coverage: If steroids are continued, please consider increasing meal coverage to Novolog 6 units TID with meals if patient eats at least 50% of meals.  Thanks, Orlando PennerMarie Timaya Bojarski, RN, MSN, CDE Diabetes Coordinator Inpatient Diabetes Program [phone] (Team Pager from 8am to 5pm)

## 2017-10-26 NOTE — Progress Notes (Signed)
Patient ID: Jesus [name], male   DOB: Jan 12, 1935, 83 y.o.   MRN: [id number]  ACP note  Patient and brother at the bedside  Diagnosis: Acute hypoxic respiratory failure, pneumonia, acute myocardial infarction, hypertension, dementia, diabetes, BPH, acute kidney injury on chronic kidney disease.  Patient remains a full code.  Patient will go to the ICU for closer monitoring.  Case discussed with critical care specialist.  Watch creatinine closely with diuresis.  Respiratory status will need to improve prior to disposition.  Time spent on ACP discussion 17 minutes  Dr. Alford Highlandichard Salmaan Patchin

## 2017-10-26 NOTE — Progress Notes (Signed)
Patient Name: Jesus [name] Date of Encounter: 10/26/2017  Hospital Problem List     Active Problems:   NSTEMI (non-ST elevated myocardial infarction) [facility])    Patient Profile  Patient admitted with shortness of breath and a diagnosis of pneumonia.  Is been on antibiotics.  Had elevated troponin of 15.57.  EKG not interpretable secondary to paced rhythm.  No chest pain.  Due to elevated troponin, pursue left heart cath to evaluate coronary anatomy.  LV function appeared normal.  Subjective   No chest pain or shortness of breath.  Inpatient Medications    . amLODipine  10 mg Oral Daily  . aspirin  81 mg Oral Daily  . atorvastatin  40 mg Oral q1800  . azithromycin  250 mg Oral Daily  . budesonide (PULMICORT) nebulizer solution  0.5 mg Nebulization BID  . docusate sodium  100 mg Oral BID  . donepezil  10 mg Oral QHS  . furosemide  40 mg Intravenous Daily  . insulin aspart  0-5 Units Subcutaneous QHS  . insulin aspart  0-9 Units Subcutaneous TID WC  . insulin aspart  3 Units Subcutaneous TID WC  . insulin glargine  14 Units Subcutaneous Daily  . ipratropium-albuterol  3 mL Nebulization Q6H  . lisinopril  40 mg Oral Daily  . mouth rinse  15 mL Mouth Rinse BID  . memantine  5 mg Oral BID  . methylPREDNISolone (SOLU-MEDROL) injection  40 mg Intravenous Daily  . metoprolol succinate  25 mg Oral Daily  . multivitamin with minerals  1 tablet Oral Daily  . sodium chloride flush  3 mL Intravenous Q12H  . tamsulosin  0.8 mg Oral QHS    Vital Signs    Vitals:   10/25/17 1930 10/26/17 0159 10/26/17 0246 10/26/17 0730  BP: 116/69  133/64   Pulse: 93  79   Resp: 18  20   Temp: 98.3 F (36.8 C)  98 F (36.7 C)   TempSrc: Oral  Axillary   SpO2:  91% 93% 96%  Weight:   77.2 kg (170 lb 4.8 oz)   Height:        Intake/Output Summary (Last 24 hours) at 10/26/2017 0813 Last data filed at 10/26/2017 0246 Gross per 24 hour  Intake -  Output 850 ml  Net -850 ml   Filed Weights   10/24/17 1340 10/25/17 0521 10/26/17 0246  Weight: 73.8 kg (162 lb 11.2 oz) 76.5 kg (168 lb 9.6 oz) 77.2 kg (170 lb 4.8 oz)    Physical Exam    GEN: Well nourished, well developed, in no acute distress.  HEENT: normal.  Neck: Supple, no JVD, carotid bruits, or masses. Cardiac: RRR, no murmurs, rubs, or gallops. No clubbing, cyanosis, edema.  Radials/DP/PT 2+ and equal bilaterally.  Respiratory:  Respirations regular and unlabored, clear to auscultation bilaterally. GI: Soft, nontender, nondistended, BS + x 4. MS: no deformity or atrophy. Skin: warm and dry, no rash. Neuro:  Strength and sensation are intact. Psych: Normal affect.  Labs    CBC Recent Labs    10/25/17 0613 10/26/17 0359  WBC 11.7* 14.4*  HGB 9.8* 9.2*  HCT 27.8* 26.3*  MCV 88.5 89.1  PLT 184 201   Basic Metabolic Panel Recent Labs    40/98/11 0613 10/26/17 0359  NA 131* 136  K 3.9 4.1  CL 102 104  CO2 18* 21*  GLUCOSE 259* 264*  BUN 47* 62*  CREATININE 1.77* 1.57*  CALCIUM 7.9* 8.4*   Liver  Function Tests No results for input(s): AST, ALT, ALKPHOS, BILITOT, PROT, ALBUMIN in the last 72 hours. No results for input(s): LIPASE, AMYLASE in the last 72 hours. Cardiac Enzymes Recent Labs    10/24/17 0557  TROPONINI 15.57*   BNP No results for input(s): BNP in the last 72 hours. D-Dimer No results for input(s): DDIMER in the last 72 hours. Hemoglobin A1C Recent Labs    10/24/17 0557  HGBA1C 7.5*   Fasting Lipid Panel No results for input(s): CHOL, HDL, LDLCALC, TRIG, CHOLHDL, LDLDIRECT in the last 72 hours. Thyroid Function Tests No results for input(s): TSH, T4TOTAL, T3FREE, THYROIDAB in the last 72 hours.  Invalid input(s): FREET3  Telemetry    Ventricular paced rhythm  ECG     Paced rhythm  Radiology    Dg Chest 2 View  Result Date: 10/22/2017 CLINICAL DATA:  Weakness.  Not eating or drinking EXAM: CHEST - 2 VIEW COMPARISON:  10/04/2016 FINDINGS: Streaky opacity at the right  base. No edema or effusion. Normal heart size. Mild aortic tortuosity. Dual-chamber pacer leads from the left in stable position. IMPRESSION: Possible bronchopneumonia at the right base. Electronically Signed   By: Marnee SpringJonathon  Watts M.D.   On: 10/22/2017 19:13   Dg Chest Port 1 View  Result Date: 10/25/2017 CLINICAL DATA:  Increased shortness of breath, increased troponins EXAM: PORTABLE CHEST 1 VIEW COMPARISON:  10/22/2017 FINDINGS: There is interstitial and alveolar airspace opacities throughout the right lung. There is a small right pleural effusion. There is no left pleural effusion. There is mild interstitial thickening of the left lung. There is no focal consolidation. The heart size is normal. There is a dual lead cardiac pacemaker. There is no acute osseous abnormality. IMPRESSION: Interstitial and alveolar airspace opacities throughout the right lung and mild interstitial thickening of the left lung. Differential considerations include asymmetric pulmonary edema versus multilobar pneumonia. Electronically Signed   By: Elige KoHetal  Patel   On: 10/25/2017 09:29    Assessment & Plan    83 year old male who was admitted with shortness of breath with diagnosis of pneumonia.  He has been on antibiotics..  Had an elevated troponin.  Is ventricularly paced.  Does not complain of chest pain.  Troponin increased to 13.  Cardiac cath was held due to hypoxia.  Patient able to lay flat this morning with pulse ox is greater than 90.  Denies chest pain.  We will proceed with left heart cath to evaluate coronary anatomy to guide further therapy.  Signed, Darlin PriestlyKenneth A. Satoru Milich MD 10/26/2017, 8:13 AM  Pager: [phone]

## 2017-10-26 NOTE — Progress Notes (Signed)
Patient ID: Jesus [name], male   DOB: Sep 20, 1934, 83 y.o.   MRN: [id number]   [employer] [facility] PROGRESS NOTE  Jesus [name] WUJ:[id number] DOB: [birth year] DOA: 10/22/2017 PCP: Leotis Shames, MD  HPI/Subjective: Patient seen after cardiac catheterization and again this afternoon prior to the order to transfer to the ICU.  Patient's respiratory status had declined.  Patient complains of some shortness of breath and cough.  Objective: Vitals:   10/26/17 1331 10/26/17 1345  BP: 125/61   Pulse: 74 65  Resp:  20  Temp:    SpO2:  93%    Filed Weights   10/25/17 0521 10/26/17 0246 10/26/17 0919  Weight: 76.5 kg (168 lb 9.6 oz) 77.2 kg (170 lb 4.8 oz) 77.1 kg (170 lb)    ROS: Review of Systems  Unable to perform ROS: Acuity of condition  Respiratory: Positive for cough and shortness of breath.   Cardiovascular: Negative for chest pain.  Gastrointestinal: Negative for abdominal pain.   Exam: Physical Exam  Constitutional: He is oriented to person, place, and time.  HENT:  Nose: No mucosal edema.  Mouth/Throat: No oropharyngeal exudate or posterior oropharyngeal edema.  Eyes: Pupils are equal, round, and reactive to light. Conjunctivae and lids are normal.  Neck: No JVD present. Carotid bruit is not present. No edema present. No thyroid mass and no thyromegaly present.  Cardiovascular: S1 normal and S2 normal. Exam reveals no gallop.  No murmur heard. Pulses:      Dorsalis pedis pulses are 2+ on the right side, and 2+ on the left side.  Respiratory: Accessory muscle usage present. He has decreased breath sounds in the right middle field, the right lower field, the left middle field and the left lower field. He has wheezes in the right middle field, the right lower field, the left middle field and the left lower field. He has no rhonchi. He has no rales.  GI: Soft. Bowel sounds are normal. There is no tenderness.  Musculoskeletal:       Right ankle: He exhibits no swelling.       Left  ankle: He exhibits no swelling.  Lymphadenopathy:    He has no cervical adenopathy.  Neurological: He is alert and oriented to person, place, and time. No cranial nerve deficit.  Skin: Skin is warm. No rash noted. Nails show no clubbing.  Psychiatric: He has a normal mood and affect.      Data Reviewed: Basic Metabolic Panel: Recent Labs  Lab 10/22/17 1834 10/23/17 0531 10/24/17 0557 10/25/17 0613 10/26/17 0359  NA 130* 132* 133* 131* 136  K 3.5 3.0* 3.8 3.9 4.1  CL 95* 99 101 102 104  CO2 23 21* 23 18* 21*  GLUCOSE 294* 198* 235* 259* 264*  BUN 33* 30* 40* 47* 62*  CREATININE 1.54* 1.42* 1.88* 1.77* 1.57*  CALCIUM 8.7* 7.8* 8.1* 7.9* 8.4*   Liver Function Tests: Recent Labs  Lab 10/23/17 0531  AST 54*  ALT 24  ALKPHOS 53  BILITOT 0.8  PROT 5.7*  ALBUMIN 2.9*   CBC: Recent Labs  Lab 10/22/17 1834 10/23/17 0531 10/25/17 0613 10/26/17 0359  WBC 15.1* 13.3* 11.7* 14.4*  HGB 11.3* 10.0* 9.8* 9.2*  HCT 31.2* 28.4* 27.8* 26.3*  MCV 86.8 88.4 88.5 89.1  PLT 148* 138* 184 201   Cardiac Enzymes: Recent Labs  Lab 10/22/17 1834 10/23/17 0048 10/24/17 0557  TROPONINI 5.71* 4.22* 15.57*    CBG: Recent Labs  Lab 10/25/17 1651 10/25/17 2040 10/26/17 0810 10/26/17 9562  10/26/17 1130  GLUCAP 373* 312* 286* 287* 229*    Recent Results (from the past 240 hour(s))  Blood culture (routine x 2)     Status: None (Preliminary result)   Collection Time: 10/22/17  8:36 PM  Result Value Ref Range Status   Specimen Description BLOOD RIGHT ANTECUBITAL  Final   Special Requests   Final    BOTTLES DRAWN AEROBIC AND ANAEROBIC Blood Culture adequate volume   Culture   Final    NO GROWTH 4 DAYS Performed at [facility], [address] [place], [place] [address]    Report Status PENDING  Incomplete  Blood culture (routine x 2)     Status: None (Preliminary result)   Collection Time: 10/22/17  8:36 PM  Result Value Ref Range Status   Specimen Description  BLOOD RIGHT ANTECUBITAL  Final   Special Requests   Final    BOTTLES DRAWN AEROBIC AND ANAEROBIC Blood Culture adequate volume   Culture   Final    NO GROWTH 4 DAYS Performed at [facility], [address] [place], [place] [address]    Report Status PENDING  Incomplete  Urine Culture     Status: None   Collection Time: 10/22/17  9:32 PM  Result Value Ref Range Status   Specimen Description   Final    URINE, RANDOM Performed at [facility], [address], [place], [place] [address]    Special Requests   Final    Normal Performed at [facility], [address], [place], [place] [address]    Culture   Final    NO GROWTH Performed at [facility], [address] [place], [place] [address]    Report Status 10/25/2017 FINAL  Final      Scheduled Meds: . amLODipine  10 mg Oral Daily  . aspirin  81 mg Oral Daily  . atorvastatin  40 mg Oral q1800  . azithromycin  250 mg Oral Daily  . budesonide (PULMICORT) nebulizer solution  0.5 mg Nebulization BID  . clopidogrel  75 mg Oral Daily  . docusate sodium  100 mg Oral BID  . donepezil  10 mg Oral QHS  . furosemide  40 mg Intravenous Daily  . insulin aspart  0-5 Units Subcutaneous QHS  . insulin aspart  0-9 Units Subcutaneous TID WC  . insulin aspart  3 Units Subcutaneous TID WC  . insulin glargine  17 Units Subcutaneous QHS  . ipratropium-albuterol  3 mL Nebulization Q6H  . isosorbide mononitrate  30 mg Oral Daily  . lisinopril  40 mg Oral Daily  . mouth rinse  15 mL Mouth Rinse BID  . memantine  5 mg Oral BID  . methylPREDNISolone (SOLU-MEDROL) injection  40 mg Intravenous Daily  . metoprolol succinate  25 mg Oral Daily  . multivitamin with minerals  1 tablet Oral Daily  . sodium chloride flush  3 mL Intravenous Q12H  . tamsulosin  0.8 mg Oral QHS   Continuous Infusions: . sodium chloride    . cefTRIAXone (ROCEPHIN)  IV Stopped (10/26/17 1308)    Assessment/Plan:  1. Acute  hypoxic respiratory failure.  Patient placed on BiPAP this afternoon secondary to acute respiratory distress.  The patient was given IV Lasix this morning.  He did have IV fluids going with the cardiac catheterization.  I ordered Lasix 80 mg IV this afternoon to try to get his respiratory status better. 2. Pneumonia, prostate infection.  On Rocephin and Zithromax.  Urine culture no growth.  Blood cultures no growth.  Continue nebulizer treatments.  I started steroids for wheezing 3. NSTEMI.  Troponin peaked at 15.  Cardiology did a cardiac catheterization today and it showed triple-vessel disease.  Cardiology believed he was not the best candidate for CABG at this time and recommended medical management.  Patient on aspirin, Plavix, Imdur, Toprol and atorvastatin.  Echocardiogram showed normal EF. 4. Essential hypertension on Norvasc, Toprol, lisinopril 5. History of dementia without behavioral disturbance on Aricept 6. Type 2 diabetes mellitus.  Lantus 17 units daily.  Sliding scale insulin.  Hemoglobin A1c 7.5.  Sugars will be high with steroids. 7. BPH on Flomax 8. Hypokalemia replaced 9. Acute kidney chronic kidney disease stage III monitor closely.  Fluid stopped with fluid overload.  Watch kidney function closely.  Code Status:     Code Status Orders  (From admission, onward)        Start     Ordered   10/22/17 2335  Full code  Continuous     10/22/17 2334    Code Status History    This patient has a current code status but no historical code status.    Advance Directive Documentation     Most Recent Value  Type of Advance Directive  Healthcare Power of Attorney  Pre-existing out of facility DNR order (yellow form or pink MOST form)  -  "MOST" Form in Place?  -     Family Communication: Brother  at the bedside this morning and on the phone this afternoon. Disposition Plan: Transferred to the ICU  Consultants:  Cardiology  Antibiotics:  Rocephin  Zithromax  Time  spent: 40 minutes including ACP time  [employer] Corporationichard Annamae Shivley  [employer] [facility]

## 2017-10-27 ENCOUNTER — Inpatient Hospital Stay: Payer: Medicare Other

## 2017-10-27 DIAGNOSIS — J9621 Acute and chronic respiratory failure with hypoxia: Secondary | ICD-10-CM

## 2017-10-27 DIAGNOSIS — J189 Pneumonia, unspecified organism: Secondary | ICD-10-CM

## 2017-10-27 LAB — CBC
HCT: 27.1 % — ABNORMAL LOW (ref 40.0–52.0)
Hemoglobin: 9.4 g/dL — ABNORMAL LOW (ref 13.0–18.0)
MCH: 30.9 pg (ref 26.0–34.0)
MCHC: 34.8 g/dL (ref 32.0–36.0)
MCV: 88.9 fL (ref 80.0–100.0)
PLATELETS: 219 10*3/uL (ref 150–440)
RBC: 3.05 MIL/uL — AB (ref 4.40–5.90)
RDW: 14.2 % (ref 11.5–14.5)
WBC: 12.8 10*3/uL — AB (ref 3.8–10.6)

## 2017-10-27 LAB — BASIC METABOLIC PANEL
ANION GAP: 9 (ref 5–15)
BUN: 61 mg/dL — ABNORMAL HIGH (ref 8–23)
CO2: 24 mmol/L (ref 22–32)
Calcium: 8.5 mg/dL — ABNORMAL LOW (ref 8.9–10.3)
Chloride: 105 mmol/L (ref 98–111)
Creatinine, Ser: 1.53 mg/dL — ABNORMAL HIGH (ref 0.61–1.24)
GFR, EST AFRICAN AMERICAN: 47 mL/min — AB (ref >60)
GFR, EST NON AFRICAN AMERICAN: 41 mL/min — AB (ref >60)
Glucose, Bld: 261 mg/dL — ABNORMAL HIGH (ref 70–99)
POTASSIUM: 4 mmol/L (ref 3.5–5.1)
SODIUM: 138 mmol/L (ref 135–145)

## 2017-10-27 LAB — GLUCOSE, CAPILLARY
GLUCOSE-CAPILLARY: 269 mg/dL — AB (ref 70–99)
GLUCOSE-CAPILLARY: 273 mg/dL — AB (ref 70–99)
GLUCOSE-CAPILLARY: 234 mg/dL — AB (ref 70–99)
Glucose-Capillary: 248 mg/dL — ABNORMAL HIGH (ref 70–99)

## 2017-10-27 LAB — PROCALCITONIN: PROCALCITONIN: 0.65 ng/mL

## 2017-10-27 LAB — TROPONIN I: Troponin I: 4.75 ng/mL (ref <0.03)

## 2017-10-27 LAB — MAGNESIUM: Magnesium: 2.3 mg/dL (ref 1.7–2.4)

## 2017-10-27 LAB — PHOSPHORUS: Phosphorus: 5.2 mg/dL — ABNORMAL HIGH (ref 2.5–4.6)

## 2017-10-27 LAB — CULTURE, BLOOD (ROUTINE X 2)
CULTURE: NO GROWTH
CULTURE: NO GROWTH
SPECIAL REQUESTS: ADEQUATE
Special Requests: ADEQUATE

## 2017-10-27 MED ORDER — SIMETHICONE 80 MG PO CHEW
160.0000 mg | CHEWABLE_TABLET | Freq: Four times a day (QID) | ORAL | Status: DC | PRN
  Filled 2017-10-27: qty 2

## 2017-10-27 MED ORDER — FUROSEMIDE 40 MG PO TABS
40.0000 mg | ORAL_TABLET | Freq: Every day | ORAL | Status: DC
  Administered 2017-10-27 – 2017-10-29 (×3): 40 mg via ORAL
  Filled 2017-10-27: qty 1
  Filled 2017-10-27: qty 2
  Filled 2017-10-27: qty 1

## 2017-10-27 NOTE — Progress Notes (Signed)
Patient has remained pleasantly confused all day, but brother who has been visiting states he seems back to being his "normal self". Up in chair with minimal help, VSS, groin site remains clean and dry. Pt removed from bipap and started on 4 L 02, then weaned down to none, sats remain 93 % on RA. Report called to floor  And will shortly be transferred to floor

## 2017-10-27 NOTE — Progress Notes (Signed)
He looks great this morning on [place] O2.  Has been off of BiPAP for several hours.  No distress and no new complaints.  Vitals:   10/27/17 0500 10/27/17 0600 10/27/17 0700 10/27/17 0800  BP: 136/64 128/64 131/67 (!) 147/81  Pulse: 66 64 60 79  Resp: 16 12 13 17   Temp:  98.2 F (36.8 C)  97.8 F (36.6 C)  TempSrc:  Axillary    SpO2: 98% 98% 99% 94%  Weight:      Height:       Gen: NAD HEENT: NCAT, sclera white Neck: No JVD Lungs: Crackles and bronchial BS in right base Cardiovascular: RRR, no murmurs Abdomen: Soft, nontender, normal BS Ext: without clubbing, cyanosis, edema Neuro: grossly intact Skin: Limited exam, no lesions noted  BMP Latest Ref Rng & Units 10/27/2017 10/26/2017 10/25/2017  Glucose 70 - 99 mg/dL 621(H261(H) 086(V264(H) 784(O259(H)  BUN 8 - 23 mg/dL 96(E61(H) 95(M62(H) 84(X47(H)  Creatinine 0.61 - 1.24 mg/dL 3.24(M1.53(H) 0.10(U1.57(H) 7.25(D1.77(H)  Sodium 135 - 145 mmol/L 138 136 131(L)  Potassium 3.5 - 5.1 mmol/L 4.0 4.1 3.9  Chloride 98 - 111 mmol/L 105 104 102  CO2 22 - 32 mmol/L 24 21(L) 18(L)  Calcium 8.9 - 10.3 mg/dL 6.6(Y8.5(L) 4.0(H8.4(L) 7.9(L)   CBC Latest Ref Rng & Units 10/27/2017 10/26/2017 10/25/2017  WBC 3.8 - 10.6 K/uL 12.8(H) 14.4(H) 11.7(H)  Hemoglobin 13.0 - 18.0 g/dL 4.7(Q9.4(L) 2.5(Z9.2(L) 5.6(L9.8(L)  Hematocrit 40.0 - 52.0 % 27.1(L) 26.3(L) 27.8(L)  Platelets 150 - 440 K/uL 219 201 184   CXR: Infiltrate involving RUL and RLL consistent with pneumonia.  Less likely asymmetric edema  IMPRESSION: Acute hypoxemic respiratory failure Probable R sided CAP with small parapneumonic effusion Severe three-vessel coronary disease.  Medical management for now AKI, creatinine improving Dementia  Transfer back to telemetry floor Complete 7-day course of antibiotics for CAP Further management of coronary disease per cardiology  After transfer, PCCM will sign off. Please call if we can be of further assistance    Billy Fischeravid Brandin Dilday, MD PCCM service Mobile [phone] Pager [phone] 10/27/2017 11:35 AM

## 2017-10-27 NOTE — Progress Notes (Signed)
Patient ID: Jesus [name], male   DOB: 1934-08-17, 83 y.o.   MRN: [id number]   [facility] PROGRESS NOTE  Jesus Cheadlelvin Podesta [id number] DOB: 1934-08-17 DOA: 10/22/2017 PCP: Leotis ShamesSingh, Jasmine, MD  HPI/Subjective: respiratory status improved doing well overall. Weaned off the room air Objective: Vitals:   10/27/17 1200 10/27/17 1300  BP:    Pulse: 67 63  Resp: 19 15  Temp: 98.2 F (36.8 C)   SpO2: 94% 93%    Filed Weights   10/26/17 0919 10/26/17 1700 10/27/17 0422  Weight: 77.1 kg (170 lb) 76.9 kg (169 lb 8.5 oz) 76.7 kg (169 lb 1.5 oz)    ROS: Review of Systems  Unable to perform ROS: Acuity of condition  Respiratory: Positive for cough and shortness of breath.   Cardiovascular: Negative for chest pain.  Gastrointestinal: Negative for abdominal pain.   Exam: Physical Exam  Constitutional: He is oriented to person, place, and time.  HENT:  Nose: No mucosal edema.  Mouth/Throat: No oropharyngeal exudate or posterior oropharyngeal edema.  Eyes: Pupils are equal, round, and reactive to light. Conjunctivae and lids are normal.  Neck: No JVD present. Carotid bruit is not present. No edema present. No thyroid mass and no thyromegaly present.  Cardiovascular: S1 normal and S2 normal. Exam reveals no gallop.  No murmur heard. Pulses:      Dorsalis pedis pulses are 2+ on the right side, and 2+ on the left side.  Respiratory: Accessory muscle usage present. He has decreased breath sounds in the right middle field, the right lower field, the left middle field and the left lower field. He has wheezes in the right middle field, the right lower field, the left middle field and the left lower field. He has no rhonchi. He has no rales.  GI: Soft. Bowel sounds are normal. There is no tenderness.  Musculoskeletal:       Right ankle: He exhibits no swelling.       Left ankle: He exhibits no swelling.  Lymphadenopathy:    He has no cervical adenopathy.  Neurological: He is alert and oriented  to person, place, and time. No cranial nerve deficit.  Skin: Skin is warm. No rash noted. Nails show no clubbing.  Psychiatric: He has a normal mood and affect.      Data Reviewed: Basic Metabolic Panel: Recent Labs  Lab 10/23/17 0531 10/24/17 0557 10/25/17 0613 10/26/17 0359 10/27/17 0439  NA 132* 133* 131* 136 138  K 3.0* 3.8 3.9 4.1 4.0  CL 99 101 102 104 105  CO2 21* 23 18* 21* 24  GLUCOSE 198* 235* 259* 264* 261*  BUN 30* 40* 47* 62* 61*  CREATININE 1.42* 1.88* 1.77* 1.57* 1.53*  CALCIUM 7.8* 8.1* 7.9* 8.4* 8.5*  MG  --   --   --   --  2.3  PHOS  --   --   --   --  5.2*   Liver Function Tests: Recent Labs  Lab 10/23/17 0531  AST 54*  ALT 24  ALKPHOS 53  BILITOT 0.8  PROT 5.7*  ALBUMIN 2.9*   CBC: Recent Labs  Lab 10/22/17 1834 10/23/17 0531 10/25/17 0613 10/26/17 0359 10/27/17 0439  WBC 15.1* 13.3* 11.7* 14.4* 12.8*  HGB 11.3* 10.0* 9.8* 9.2* 9.4*  HCT 31.2* 28.4* 27.8* 26.3* 27.1*  MCV 86.8 88.4 88.5 89.1 88.9  PLT 148* 138* 184 201 219   Cardiac Enzymes: Recent Labs  Lab 10/22/17 1834 10/23/17 0048 10/24/17 0557 10/27/17 0439  TROPONINI 5.71*  4.22* 15.57* 4.75*    CBG: Recent Labs  Lab 10/26/17 1130 10/26/17 1618 10/26/17 2123 10/27/17 0727 10/27/17 1148  GLUCAP 229* 175* 239* 248* 269*    Recent Results (from the past 240 hour(s))  Blood culture (routine x 2)     Status: None   Collection Time: 10/22/17  8:36 PM  Result Value Ref Range Status   Specimen Description BLOOD RIGHT ANTECUBITAL  Final   Special Requests   Final    BOTTLES DRAWN AEROBIC AND ANAEROBIC Blood Culture adequate volume   Culture   Final    NO GROWTH 5 DAYS Performed at [facility], [address], [place], [place] [address]    Report Status 10/27/2017 FINAL  Final  Blood culture (routine x 2)     Status: None   Collection Time: 10/22/17  8:36 PM  Result Value Ref Range Status   Specimen Description BLOOD RIGHT ANTECUBITAL  Final   Special  Requests   Final    BOTTLES DRAWN AEROBIC AND ANAEROBIC Blood Culture adequate volume   Culture   Final    NO GROWTH 5 DAYS Performed at [facility], [address], [place], [place] [address]    Report Status 10/27/2017 FINAL  Final  Urine Culture     Status: None   Collection Time: 10/22/17  9:32 PM  Result Value Ref Range Status   Specimen Description   Final    URINE, RANDOM Performed at [facility], [address], [place], [place] [address]    Special Requests   Final    Normal Performed at [facility], [address] [place], [place] [address]    Culture   Final    NO GROWTH Performed at [facility], [address], [place], [place] [address]    Report Status 10/25/2017 FINAL  Final  MRSA PCR Screening     Status: Abnormal   Collection Time: 10/26/17  4:23 PM  Result Value Ref Range Status   MRSA by PCR POSITIVE (A) NEGATIVE Final    Comment:        The GeneXpert MRSA Assay (FDA approved for NASAL specimens only), is one component of a comprehensive MRSA colonization surveillance program. It is not intended to diagnose MRSA infection nor to guide or monitor treatment for MRSA infections. RESULT CALLED TO, READ BACK BY AND VERIFIED WITH: CHERYL GREEN 10/26/17 AT 1823 BY HS Performed at [facility], [address] [place], [place] [address]       Scheduled Meds: . amLODipine  10 mg Oral Daily  . aspirin  81 mg Oral Daily  . atorvastatin  40 mg Oral q1800  . azithromycin  250 mg Oral Daily  . Chlorhexidine Gluconate Cloth  6 each Topical Q0600  . clopidogrel  75 mg Oral Daily  . docusate sodium  100 mg Oral BID  . donepezil  10 mg Oral QHS  . enoxaparin (LOVENOX) injection  40 mg Subcutaneous Q24H  . furosemide  40 mg Oral Daily  . insulin aspart  0-5 Units Subcutaneous QHS  . insulin aspart  0-9 Units Subcutaneous TID WC  . insulin aspart  3 Units Subcutaneous TID WC  . insulin glargine  17 Units  Subcutaneous QHS  . isosorbide mononitrate  30 mg Oral Daily  . lisinopril  40 mg Oral Daily  . mouth rinse  15 mL Mouth Rinse BID  . memantine  5 mg Oral BID  . metoprolol succinate  25 mg Oral Daily  . multivitamin with minerals  1 tablet Oral Daily  . mupirocin ointment  1 application Nasal BID  . sodium chloride flush  3 mL Intravenous Q12H  . tamsulosin  0.8 mg Oral QHS   Continuous Infusions: . sodium chloride    . cefTRIAXone (ROCEPHIN)  IV 1 g (10/27/17 0600)    Assessment/Plan:  1. Acute hypoxic respiratory failure.  Patient was placed on BiPAP yesterday now weaned off resumed oral lasix 2. Pneumonia, prostate infection.  On Rocephin and Zithromax.  Urine culture no growth.  Blood cultures no growth.  Continue nebulizer treatments.  I started steroids for wheezing 3. NSTEMI.  Troponin peaked at 15.  Cardiology did a cardiac catheterization today and it showed triple-vessel disease.  Cardiology believed he was not the best candidate for CABG at this time and recommended medical management.  Patient on aspirin, Plavix, Imdur, Toprol and atorvastatin.  Echocardiogram showed normal EF. 4. Essential hypertension on Norvasc, Toprol, lisinopril 5. History of dementia without behavioral disturbance on Aricept 6. Type 2 diabetes mellitus.  Lantus 17 units daily.  Sliding scale insulin.  Hemoglobin A1c 7.5.  Sugars will be high with steroids. 7. BPH on Flomax 8. Hypokalemia replaced 9. Acute kidney chronic kidney disease stage III monitor closely.  Fluid stopped with fluid overload.  Watch kidney function closely. Plan for transfer to 2 A later today Code Status:     Code Status Orders  (From admission, onward)        Start     Ordered   10/22/17 2335  Full code  Continuous     10/22/17 2334    Code Status History    This patient has a current code status but no historical code status.    Advance Directive Documentation     Most Recent Value  Type of Advance Directive   Healthcare Power of Attorney  Pre-existing out of facility DNR order (yellow form or pink MOST form)  -  "MOST" Form in Place?  -     Family Communication: Brother  at the bedside this morning and on the phone this afternoon. Disposition Plan: Transferred to the ICU  Consultants:  Cardiology  Antibiotics:  Rocephin  Zithromax  Time spent: 25 mins Jozy Mcphe[employer]

## 2017-10-28 LAB — BASIC METABOLIC PANEL
Anion gap: 8 (ref 5–15)
BUN: 59 mg/dL — AB (ref 8–23)
CALCIUM: 8.3 mg/dL — AB (ref 8.9–10.3)
CHLORIDE: 105 mmol/L (ref 98–111)
CO2: 26 mmol/L (ref 22–32)
Creatinine, Ser: 1.56 mg/dL — ABNORMAL HIGH (ref 0.61–1.24)
GFR calc non Af Amer: 40 mL/min — ABNORMAL LOW (ref >60)
GFR, EST AFRICAN AMERICAN: 46 mL/min — AB (ref >60)
Glucose, Bld: 128 mg/dL — ABNORMAL HIGH (ref 70–99)
Potassium: 3.7 mmol/L (ref 3.5–5.1)
SODIUM: 139 mmol/L (ref 135–145)

## 2017-10-28 LAB — CBC
HCT: 29.3 % — ABNORMAL LOW (ref 40.0–52.0)
Hemoglobin: 10.3 g/dL — ABNORMAL LOW (ref 13.0–18.0)
MCH: 31.3 pg (ref 26.0–34.0)
MCHC: 35.1 g/dL (ref 32.0–36.0)
MCV: 89.3 fL (ref 80.0–100.0)
PLATELETS: 271 10*3/uL (ref 150–440)
RBC: 3.28 MIL/uL — ABNORMAL LOW (ref 4.40–5.90)
RDW: 14.3 % (ref 11.5–14.5)
WBC: 11 10*3/uL — AB (ref 3.8–10.6)

## 2017-10-28 LAB — GLUCOSE, CAPILLARY
GLUCOSE-CAPILLARY: 99 mg/dL (ref 70–99)
GLUCOSE-CAPILLARY: 313 mg/dL — AB (ref 70–99)
Glucose-Capillary: 234 mg/dL — ABNORMAL HIGH (ref 70–99)
Glucose-Capillary: 341 mg/dL — ABNORMAL HIGH (ref 70–99)

## 2017-10-28 LAB — PROCALCITONIN: Procalcitonin: 0.35 ng/mL

## 2017-10-28 MED ORDER — CEFDINIR 300 MG PO CAPS
300.0000 mg | ORAL_CAPSULE | Freq: Two times a day (BID) | ORAL | Status: DC
Start: 2017-10-28 — End: 2017-10-29
  Administered 2017-10-28 (×2): 300 mg via ORAL
  Filled 2017-10-28 (×5): qty 1

## 2017-10-28 NOTE — Progress Notes (Signed)
[facility] -  at [facility]   PATIENT NAME: Jesus [name]    MR#:  [id number]  DATE OF BIRTH:  12/15/1934  SUBJECTIVE:  patient doing well. Wean to room air. Eating breakfast. Denies any complaints. Wants to go home.  REVIEW OF SYSTEMS:   Review of Systems  Constitutional: Negative for chills, fever and weight loss.  HENT: Negative for ear discharge, ear pain and nosebleeds.   Eyes: Negative for blurred vision, pain and discharge.  Respiratory: Negative for sputum production, shortness of breath, wheezing and stridor.   Cardiovascular: Negative for chest pain, palpitations, orthopnea and PND.  Gastrointestinal: Negative for abdominal pain, diarrhea, nausea and vomiting.  Genitourinary: Negative for frequency and urgency.  Musculoskeletal: Negative for back pain and joint pain.  Neurological: Negative for sensory change, speech change, focal weakness and weakness.  Psychiatric/Behavioral: Negative for depression and hallucinations. The patient is not nervous/anxious.    Tolerating Diet:yesTolerating PT: pending  DRUG ALLERGIES:  No Known Allergies  VITALS:  Blood pressure 132/65, pulse 63, temperature 97.9 F (36.6 C), temperature source Oral, resp. rate 18, height 5\' 7"  (1.702 m), weight 76.2 kg (167 lb 14.4 oz), SpO2 91 %.  PHYSICAL EXAMINATION:   Physical Exam  GENERAL:  83 y.o.-year-old patient lying in the bed with no acute distress.  EYES: Pupils equal, round, reactive to light and accommodation. No scleral icterus. Extraocular muscles intact.  HEENT: Head atraumatic, normocephalic. Oropharynx and nasopharynx clear.  NECK:  Supple, no jugular venous distention. No thyroid enlargement, no tenderness.  LUNGS: Normal breath sounds bilaterally, no wheezing, rales, rhonchi. No use of accessory muscles of respiration.  CARDIOVASCULAR: S1, S2 normal. No murmurs, rubs, or gallops.  ABDOMEN: Soft, nontender, nondistended. Bowel sounds present. No  organomegaly or mass.  EXTREMITIES: No cyanosis, clubbing or edema b/l.    NEUROLOGIC: Cranial nerves II through XII are intact. No focal Motor or sensory deficits b/l.   PSYCHIATRIC:  patient is alert and oriented x 3.  SKIN: No obvious rash, lesion, or ulcer.   LABORATORY PANEL:  CBC Recent Labs  Lab 10/28/17 0355  WBC 11.0*  HGB 10.3*  HCT 29.3*  PLT 271    Chemistries  Recent Labs  Lab 10/23/17 0531  10/27/17 0439 10/28/17 0355  NA 132*   < > 138 139  K 3.0*   < > 4.0 3.7  CL 99   < > 105 105  CO2 21*   < > 24 26  GLUCOSE 198*   < > 261* 128*  BUN 30*   < > 61* 59*  CREATININE 1.42*   < > 1.53* 1.56*  CALCIUM 7.8*   < > 8.5* 8.3*  MG  --   --  2.3  --   AST 54*  --   --   --   ALT 24  --   --   --   ALKPHOS 53  --   --   --   BILITOT 0.8  --   --   --    < > = values in this interval not displayed.   Cardiac Enzymes Recent Labs  Lab 10/27/17 0439  TROPONINI 4.75*   RADIOLOGY:  Dg Abd 1 View  Result Date: 10/26/2017 CLINICAL DATA:  Initial evaluation for abdominal distension. EXAM: ABDOMEN - 1 VIEW COMPARISON:  None. FINDINGS: Few mildly prominent gas-filled loops of small bowel seen within the mid and lower abdomen without evidence for obstruction or ileus. Stomach mildly distended with gas  within the gastric lumen. No appreciable free air on this limited supine view the abdomen. Punctate calcifications overlying the renal shadows most consistent with nephrolithiasis. Secreted contrast within the bladder lumen. Cardiomegaly with pacemaker electrodes noted. Extensive aortic atherosclerosis. Degenerative changes noted within the visualized spine about the hips bilaterally. IMPRESSION: 1. Nonobstructive bowel gas pattern. 2. Bilateral nephrolithiasis. Electronically Signed   By: Rise Mu M.D.   On: 10/26/2017 23:00   Dg Chest Port 1 View  Result Date: 10/27/2017 CLINICAL DATA:  Respiratory failure EXAM: PORTABLE CHEST 1 VIEW COMPARISON:  October 25, 2017  FINDINGS: Stable cardiomegaly. The hila and mediastinum are unchanged. No pneumothorax. Persistent but decreasing interstitial and alveolar opacity on the right. Probable layering effusion on the right. The left lung is clear. Stable pacemaker. IMPRESSION: Improving but persistent interstitial and alveolar opacities on the right could represent asymmetric edema versus multifocal pneumonia. No other changes. Electronically Signed   By: Gerome Sam III M.D   On: 10/27/2017 08:04   ASSESSMENT AND PLAN:   Trevian Hayashida  is a 83 y.o. male with a known history of dementia, diabetes type 2 and hypertension. Patient was brought to emergency room for "not feeling well" with generalized weakness and poor appetite for the past, 2 to 3 days.  No fever or chills, no chest pain, shortness of breath, no N/V/D, no abdominal pain, no bleeding   * Acute hypoxic respiratory failure due to CHF acute diastolic.  Patient was placed on BiPAP yesterday now weaned off to RA resumed oral lasix  *Pneumonia, prostate infection.  On Rocephin and Zithromax.--change to oral abxs -  Urine culture no growth.  Blood cultures no growth.  Continue nebulizer treatments.   *NSTEMI.  Troponin peaked at 15.  -Cardiology did a cardiac catheterization  and it showed triple-vessel disease.  Cardiology believed he was not the best candidate for CABG at this time and recommended medical management.  Patient on aspirin, Plavix, Imdur, Toprol and atorvastatin.  Echocardiogram showed normal EF. Essential hypertension on Norvasc, Toprol, lisinopril  *History of dementia without behavioral disturbance on Aricept  *Type 2 diabetes mellitus.  Lantus 17 units daily.  Sliding scale insulin.  Hemoglobin A1c 7.5.    *BPH on Flomax  *Hypokalemia replaced  *Acute kidney chronic kidney disease stage III monitor closely.  Fluid stopped with fluid overload.  Watch kidney function closely.  Physical therapy to see patient. If improves discharged to  home tomorrow. This was discussed with patient's brother Jesus [name] who patient' lives with.   Case discussed with Care Management/Social Worker. Management plans discussed with the patient, family and they are in agreement.  CODE STATUS: full  DVT Prophylaxis: lovenox  TOTAL TIME TAKING CARE OF THIS PATIENT: *30* minutes.  >50% time spent on counselling and coordination of care  POSSIBLE D/C IN 1-2 DAYS, DEPENDING ON CLINICAL CONDITION.  Note: This dictation was prepared with Dragon dictation along with smaller phrase technology. Any transcriptional errors that result from this process are unintentional.  Enedina Finner M.D on 10/28/2017 at 12:50 PM  Between 7am to 6pm - Pager - [phone]  After 6pm go to [email or web address] - password [email or web address]  [facility]  Office  [phone]  CC: Primary care physician; Leotis Shames, MDPatient ID: Jesus [name], male   DOB: 04-25-1934, 83 y.o.   MRN: [id number]

## 2017-10-28 NOTE — Evaluation (Signed)
Physical Therapy Evaluation Patient Details Name: Jesus [name] MRN: [id number] DOB: 07/11/1934 Today's Date: 10/28/2017   History of Present Illness  Pt is an 83 y.o. male presenting to hospital 10/22/17 with weakness, urinary frequency, and poor appetite.  Pt admitted with NSTEMI with elevated troponin, AKI, community acquired PNA, and prostate infection.  Pt developed acute hypoxic respiratory failure during hospitalization.  Cardiac cath performed 10/26/17 (initially delayed d/t hypoxia).  Pt then transferred to CCU 10/26/17 d/t respiratory distress after procedure and placed on Bi-PAP; now transferred to medical floor.  PMH includes htn, DM, dementia, and pacemaker.  Clinical Impression  Pt appearing to be a poor historian during session and unable to state home set-up or prior level of function.  Able to follow 1 step commands consistently; inconsistent with multi-step commands.  Per chart pt lives with his brother who is his caretaker.  Currently pt is SBA supine to sit, CGA to min assist with transfers; and CGA to min assist ambulating 10 feet with RW.  Limited distance ambulating d/t SOB, fatigue, and generalized weakness.  O2 sats 90% on room air at rest, 88% with activity, and O2 sats increased back to 90% at rest end of session (on room air); nursing notified of pt's O2 sats during session.  Pt would benefit from skilled PT to address noted impairments and functional limitations (see below for any additional details).  Upon hospital discharge, currently recommend pt discharge to STR to improve strength, activity tolerance, balance, and safety with functional mobility.    Follow Up Recommendations SNF    Equipment Recommendations  Rolling walker with 5" wheels    Recommendations for Other Services       Precautions / Restrictions Precautions Precautions: Fall Restrictions Weight Bearing Restrictions: No      Mobility  Bed Mobility Overal bed mobility: Needs Assistance Bed Mobility:  Supine to Sit     Supine to sit: Supervision;HOB elevated     General bed mobility comments: increased effort and time for pt to perform on own  Transfers Overall transfer level: Needs assistance Equipment used: Rolling walker (2 wheeled) Transfers: Sit to/from [employer]GI CorporationStand;Stand Pivot Transfers Sit to Stand: Min assist;Min guard Stand pivot transfers: Min assist(stand step turn bed to recliner with RW (assist to steady))       General transfer comment: x1 trial from bed and x1 trial from recliner; increased effort to stand with use of RW; vc's for safety required with transfers; assist to steady at times  Ambulation/Gait Ambulation/Gait assistance: Min guard;Min assist Gait Distance (Feet): 10 Feet Assistive device: Rolling walker (2 wheeled)   Gait velocity: decreased   General Gait Details: decreased B step length/foot clearance/heelstrike; intermittently unsteady (especially with turns) requiring min assist to steady; limited distance d/t fatigue  Stairs            Wheelchair Mobility    Modified Rankin (Stroke Patients Only)       Balance Overall balance assessment: Needs assistance Sitting-balance support: No upper extremity supported;Feet supported Sitting balance-Leahy Scale: Good Sitting balance - Comments: steady sitting reaching within BOS   Standing balance support: Single extremity supported Standing balance-Leahy Scale: Poor Standing balance comment: requires at least single UE support for static standing balance                             Pertinent Vitals/Pain Pain Assessment: Faces Faces Pain Scale: No hurt    Home Living Family/patient expects to be discharged  to:: Unsure                 Additional Comments: Pt unable to verbalize home setting details when asked (per chart pt lives with his brother).    Prior Function           Comments: Per chart pt's brother is pt's caretaker.  Pt unable to verbalize prior function  details when asked.     Hand Dominance        Extremity/Trunk Assessment   Upper Extremity Assessment Upper Extremity Assessment: Generalized weakness    Lower Extremity Assessment Lower Extremity Assessment: Generalized weakness    Cervical / Trunk Assessment Cervical / Trunk Assessment: Normal  Communication   Communication: No difficulties  Cognition Arousal/Alertness: Awake/alert Behavior During Therapy: WFL for tasks assessed/performed Overall Cognitive Status: (Oriented to name, DOB, place; did not know situation or date)                                        General Comments General comments (skin integrity, edema, etc.): dressing intact R groin cardiac cath site.  Nursing cleared pt for participation in physical therapy.  Pt agreeable to PT session.    Exercises     Assessment/Plan    PT Assessment Patient needs continued PT services  PT Problem List Decreased strength;Decreased activity tolerance;Decreased balance;Decreased mobility;Decreased knowledge of use of DME;Decreased knowledge of precautions;Cardiopulmonary status limiting activity       PT Treatment Interventions DME instruction;Gait training;Stair training;Functional mobility training;Therapeutic activities;Therapeutic exercise;Balance training;Patient/family education    PT Goals (Current goals can be found in the Care Plan section)  Acute Rehab PT Goals Patient Stated Goal: to be able to walk further PT Goal Formulation: With patient Time For Goal Achievement: 11/11/17 Potential to Achieve Goals: Good    Frequency Min 2X/week   Barriers to discharge Decreased caregiver support      Co-evaluation               AM-PAC PT "6 Clicks" Daily Activity  Outcome Measure Difficulty turning over in bed (including adjusting bedclothes, sheets and blankets)?: A Little Difficulty moving from lying on back to sitting on the side of the bed? : A Lot Difficulty sitting down on  and standing up from a chair with arms (e.g., wheelchair, bedside commode, etc,.)?: Unable Help needed moving to and from a bed to chair (including a wheelchair)?: A Little Help needed walking in hospital room?: A Little Help needed climbing 3-5 steps with a railing? : A Lot 6 Click Score: 14    End of Session Equipment Utilized During Treatment: Gait belt Activity Tolerance: Patient limited by fatigue Patient left: in chair;with call bell/phone within reach;with chair alarm set Nurse Communication: Mobility status;Precautions PT Visit Diagnosis: Other abnormalities of gait and mobility (R26.89);Unsteadiness on feet (R26.81);Muscle weakness (generalized) (M62.81)    Time: 1610-9604 PT Time Calculation (min) (ACUTE ONLY): 31 min   Charges:   PT Evaluation $PT Eval Low Complexity: 1 Low PT Treatments $Therapeutic Activity: 8-22 mins   PT G CodesHendricks Limes, PT 10/28/17, 4:23 PM [phone]

## 2017-10-29 ENCOUNTER — Encounter: Payer: Self-pay | Admitting: *Deleted

## 2017-10-29 LAB — GLUCOSE, CAPILLARY
Glucose-Capillary: 113 mg/dL — ABNORMAL HIGH (ref 70–99)
Glucose-Capillary: 178 mg/dL — ABNORMAL HIGH (ref 70–99)

## 2017-10-29 MED ORDER — METOPROLOL SUCCINATE ER 25 MG PO TB24: 25.0000 mg | ORAL_TABLET | Freq: Every day | ORAL | 1 refills | Status: DC

## 2017-10-29 MED ORDER — CLOPIDOGREL BISULFATE 75 MG PO TABS: 75.0000 mg | ORAL_TABLET | Freq: Every day | ORAL | 1 refills | Status: AC

## 2017-10-29 MED ORDER — ATORVASTATIN CALCIUM 40 MG PO TABS: 40.0000 mg | ORAL_TABLET | Freq: Every day | ORAL | 1 refills | Status: DC

## 2017-10-29 MED ORDER — ISOSORBIDE MONONITRATE ER 30 MG PO TB24: 30.0000 mg | ORAL_TABLET | Freq: Every day | ORAL | 1 refills | Status: DC

## 2017-10-29 MED ORDER — FUROSEMIDE 40 MG PO TABS: 40.0000 mg | ORAL_TABLET | Freq: Every day | ORAL | 1 refills | Status: DC

## 2017-10-29 MED ORDER — CEFDINIR 250 MG/5ML PO SUSR
300.0000 mg | Freq: Two times a day (BID) | ORAL | Status: DC
  Administered 2017-10-29: 300 mg via ORAL
  Filled 2017-10-29 (×2): qty 6

## 2017-10-29 NOTE — NC FL2 (Signed)
[place] MEDICAID FL2 LEVEL OF CARE SCREENING TOOL     IDENTIFICATION  Patient Name: Jesus [name] Birthdate: 26-Jun-1934 Sex: male Admission Date (Current Location): 10/22/2017  [place]   Facility and Address:  [facility], [address], [place], [place] [address]      Provider Number: [id number]  Attending Physician Name and Address:  Houston SirenSainani, Vivek J, MD  Relative Name and Phone Number:  Dvorsky,Clinton Brother [phone]     Current Level of Care: Hospital Recommended Level of Care: Skilled Nursing Facility Prior Approval Number:    Date Approved/Denied:   PASRR Number: [id number]  Discharge Plan: SNF    Current Diagnoses: Patient Active Problem List   Diagnosis Date Noted  . NSTEMI (non-ST elevated myocardial infarction) (HCC) 10/22/2017    Orientation RESPIRATION BLADDER Height & Weight     Self, Place  Normal Incontinent Weight: 166 lb 3.2 oz (75.4 kg) Height:  5\' 7"  (170.2 cm)  BEHAVIORAL SYMPTOMS/MOOD NEUROLOGICAL BOWEL NUTRITION STATUS      Incontinent Diet(Cardiac)  AMBULATORY STATUS COMMUNICATION OF NEEDS Skin   Limited Assist Verbally Normal                       Personal Care Assistance Level of Assistance  Bathing, Feeding, Dressing Bathing Assistance: Limited assistance Feeding assistance: Limited assistance Dressing Assistance: Limited assistance     Functional Limitations Info  Sight, Hearing, Speech Sight Info: Adequate Hearing Info: Adequate Speech Info: Adequate    SPECIAL CARE FACTORS FREQUENCY  PT (By licensed PT)     PT Frequency: 5x a week              Contractures Contractures Info: Not present    Additional Factors Info  Code Status, Allergies, Insulin Sliding Scale, Isolation Precautions Code Status Info: Full Code Allergies Info: NKA   Insulin Sliding Scale Info: insulin aspart (novoLOG) injection 0-9 Units 3x a day with meals. Isolation Precautions  Info: Contact Precauitions due to history of Mrsa.     Current Medications (10/29/2017):  This is the current hospital active medication list Current Facility-Administered Medications  Medication Dose Route Frequency Provider Last Rate Last Dose  . acetaminophen (TYLENOL) tablet 650 mg  650 mg Oral Q4H PRN Dalia HeadingFath, Kenneth A, MD      . albuterol (PROVENTIL) (2.5 MG/3ML) 0.083% nebulizer solution 2.5 mg  2.5 mg Nebulization Q4H PRN Houston SirenSainani, Vivek J, MD   2.5 mg at 10/25/17 1058  . amLODipine (NORVASC) tablet 10 mg  10 mg Oral Daily Cammy CopaMaier, Angela, MD   10 mg at 10/29/17 0916  . aspirin chewable tablet 81 mg  81 mg Oral Daily Cammy CopaMaier, Angela, MD   81 mg at 10/29/17 0916  . atorvastatin (LIPITOR) tablet 40 mg  40 mg Oral q1800 Alford HighlandWieting, Richard, MD   40 mg at 10/28/17 1812  . azelastine (ASTELIN) 0.1 % nasal spray 1 spray  1 spray Each Nare BID PRN Cammy CopaMaier, Angela, MD      . azithromycin Christena Deem(ZITHROMAX) tablet 250 mg  250 mg Oral Daily Alford HighlandWieting, Richard, MD   250 mg at 10/29/17 0916  . bisacodyl (DULCOLAX) EC tablet 5 mg  5 mg Oral Daily PRN Cammy CopaMaier, Angela, MD      . cefdinir (OMNICEF) capsule 300 mg  300 mg Oral Q12H Enedina FinnerPatel, Sona, MD   300 mg at 10/28/17 2147  . Chlorhexidine Gluconate Cloth 2 % PADS 6 each  6 each Topical Q0600 Merwyn KatosSimonds, David B,  MD   6 each at 10/29/17 0559  . clopidogrel (PLAVIX) tablet 75 mg  75 mg Oral Daily Dalia Heading, MD   75 mg at 10/29/17 0916  . docusate sodium (COLACE) capsule 100 mg  100 mg Oral BID Cammy Copa, MD   100 mg at 10/29/17 0916  . donepezil (ARICEPT) tablet 10 mg  10 mg Oral QHS Cammy Copa, MD   10 mg at 10/28/17 2147  . enoxaparin (LOVENOX) injection 40 mg  40 mg Subcutaneous Q24H Merwyn Katos, MD   40 mg at 10/28/17 2154  . furosemide (LASIX) tablet 40 mg  40 mg Oral Daily Merwyn Katos, MD   40 mg at 10/29/17 0916  . HYDROcodone-acetaminophen (NORCO/VICODIN) 5-325 MG per tablet 1-2 tablet  1-2 tablet Oral Q4H PRN Cammy Copa, MD   1 tablet at 10/23/17  0849  . insulin aspart (novoLOG) injection 0-5 Units  0-5 Units Subcutaneous QHS Cammy Copa, MD   4 Units at 10/28/17 2148  . insulin aspart (novoLOG) injection 0-9 Units  0-9 Units Subcutaneous TID WC Cammy Copa, MD   5 Units at 10/28/17 1815  . insulin aspart (novoLOG) injection 3 Units  3 Units Subcutaneous TID WC Alford Highland, MD   3 Units at 10/29/17 [phone]  . insulin glargine (LANTUS) injection 17 Units  17 Units Subcutaneous QHS Alford Highland, MD   17 Units at 10/28/17 2147  . isosorbide mononitrate (IMDUR) 24 hr tablet 30 mg  30 mg Oral Daily Dalia Heading, MD   30 mg at 10/29/17 0916  . lisinopril (PRINIVIL,ZESTRIL) tablet 40 mg  40 mg Oral Daily Cammy Copa, MD   40 mg at 10/29/17 0916  . MEDLINE mouth rinse  15 mL Mouth Rinse BID Alford Highland, MD   15 mL at 10/29/17 0920  . memantine (NAMENDA) tablet 5 mg  5 mg Oral BID Cammy Copa, MD   5 mg at 10/29/17 0915  . metoprolol succinate (TOPROL-XL) 24 hr tablet 25 mg  25 mg Oral Daily Alford Highland, MD   25 mg at 10/29/17 0916  . multivitamin with minerals tablet 1 tablet  1 tablet Oral Daily Cammy Copa, MD   1 tablet at 10/29/17 0916  . mupirocin ointment (BACTROBAN) 2 % 1 application  1 application Nasal BID Merwyn Katos, MD   1 application at 10/29/17 0915  . ondansetron (ZOFRAN) injection 4 mg  4 mg Intravenous Q6H PRN Cammy Copa, MD      . simethicone [facility]) chewable tablet 160 mg  160 mg Oral Q6H PRN Tukov-Yual, Magdalene S, NP      . sodium chloride flush (NS) 0.9 % injection 3 mL  3 mL Intravenous Q12H Dalia Heading, MD   3 mL at 10/29/17 0920  . sodium chloride flush (NS) 0.9 % injection 3 mL  3 mL Intravenous PRN Dalia Heading, MD      . tamsulosin (FLOMAX) capsule 0.8 mg  0.8 mg Oral QHS Cammy Copa, MD   0.8 mg at 10/28/17 2146  . traZODone (DESYREL) tablet 25 mg  25 mg Oral QHS PRN Cammy Copa, MD   25 mg at 10/22/17 2353     Discharge Medications: Please see discharge summary for  a list of discharge medications.  Relevant Imaging Results:  Relevant Lab Results:   Additional Information SSN [id number]  Darleene Cleaver, [place]

## 2017-10-29 NOTE — Progress Notes (Signed)
Patient discharged via wheelchair and private vehicle. IV removed and catheter intact. All discharge instructions given and patient verbalizes understanding. Tele removed and returned. Prescriptions given to patient No distress noted.   

## 2017-10-29 NOTE — Discharge Instructions (Signed)

## 2017-10-29 NOTE — Progress Notes (Addendum)
Inpatient Diabetes Program Recommendations  AACE/ADA: New Consensus Statement on Inpatient Glycemic Control (2015)  Target Ranges:  Prepandial:   less than 140 mg/dL      Peak postprandial:   less than 180 mg/dL (1-2 hours)      Critically ill patients:  140 - 180 mg/dL   Results for Jesus [name], Jesus [name] (MRN [id number]) as of 10/29/2017 09:41  Ref. Range 10/28/2017 08:08 10/28/2017 12:02 10/28/2017 18:06 10/28/2017 20:49 10/29/2017 07:46  Glucose-Capillary Latest Ref Range: 70 - 99 mg/dL 99 409234 (H) 811341 (H) 914313 (H) 113 (H)   Review of Glycemic Control Diabetes history: DM2 Outpatient Diabetes medications: Glipizide 20 mg BID, Actos 30 mg daily  Current orders for Inpatient glycemic control: Lantus 17 units daily, Novolog 3 units TID with meals for meal coverage, Novolog 0-9 units TID with meals, Novolog 0-5 units QHS  Inpatient Diabetes Program Recommendations: Insulin - Meal Coverage: Please consider increasing meal coverage to Novolog 6 units TID with meals.  Thanks, Orlando PennerMarie Kadarrius Yanke, RN, MSN, CDE Diabetes Coordinator Inpatient Diabetes Program [phone] (Team Pager from 8am to 5pm)

## 2017-10-29 NOTE — Plan of Care (Signed)
  Problem: Education: Goal: Knowledge of General Education information will improve Outcome: Progressing   Problem: Nutrition: Goal: Adequate nutrition will be maintained Outcome: Progressing   Problem: Pain Managment: Goal: General experience of comfort will improve Outcome: Progressing   Problem: Safety: Goal: Ability to remain free from injury will improve Outcome: Progressing   

## 2017-10-29 NOTE — Care Management Note (Signed)
Case Management Note  Patient Details  Name: Jesus [name] MRN: [id number] Date of Birth: Aug 22, 1934  Subjective/Objective:  RNCM consult received for home health services. Patient with dementia, CHF. He lives with his brother, Clinton Mcdiarmid. He will need a walker. [facility] [place] [facility]                 Provided Mr. Bennett ScrapeFelts with a list of home health providers. He had no preference. Referral to [facility] for PT and RN. Placed on AVS. Patient will have 24 hour caregivers. PCP is Dr. Thedore MinsSingh.    Action/Plan:   Expected Discharge Date:  10/29/17               Expected Discharge Plan:  Home w Home Health Services  In-House Referral:     Discharge planning Services  CM Consult  Post Acute Care Choice:  Durable Medical Equipment, Home Health Choice offered to:  Sibling  DME Arranged:  Walker rolling DME Agency:  [facility]  [facility]  [facility]  Status of Service:  Completed, signed off  If discussed at Long Length of Stay Meetings, dates discussed:    Additional Comments:  Marily MemosLisa M Darivs Lunden, RN 10/29/2017, 11:28 AM

## 2017-10-29 NOTE — Clinical Social Work Note (Signed)
CSW received referral for SNF.  Case discussed with case manager and plan is to discharge home with home health.  CSW to sign off please re-consult if social work needs arise.  Verdie Wilms R. Druscilla Petsch, MSW, LCSWA [phone]  

## 2017-10-30 NOTE — Discharge Summary (Signed)
[facility] [place] [facility]   PATIENT NAME: Jesus [name]    MR#:  [id number]  DATE OF BIRTH:  11/15/1934  DATE OF ADMISSION:  10/22/2017 ADMITTING PHYSICIAN: Cammy Copa, MD  DATE OF DISCHARGE: 10/29/2017 12:46 PM  PRIMARY CARE PHYSICIAN: Leotis Shames, MD    ADMISSION DIAGNOSIS:  Dehydration [E86.0] ACS (acute coronary syndrome) (HCC) [I24.9] AKI (acute kidney injury) (HCC) [N17.9] Community acquired pneumonia of right lower lobe of lung (HCC) [J18.1]  DISCHARGE DIAGNOSIS:  Active Problems:   NSTEMI (non-ST elevated myocardial infarction) (HCC)   SECONDARY DIAGNOSIS:   Past Medical History:  Diagnosis Date  . Diabetes mellitus without complication (HCC)   . Hypertension     HOSPITAL COURSE:   ElvinFeltsis a83 y.o.malewith a known history of dementia, diabetes type 2 and hypertension. Patient was brought to emergency room for "not feeling well" with generalized weakness and poor appetite for the past,2 to 3 days.  * Acute hypoxic respiratory failure due to CHF acute diastolic.  -Patient was diuresed with IV Lasix and has improved.  He is now being discharged on some oral Lasix.  * Pneumonia -this was community-acquired pneumonia.  Patient was treated with IV ceftriaxone, Zithromax and then switched over to oral Ceftin and Zithromax and has finished treatment over the past 7 days. -Patient is now afebrile and hemodynamically stable.   * NSTEMI. Troponin peaked at 15.  -Cardiology did a cardiac catheterization  and it showed triple-vessel disease. Cardiology believed he was not the best candidate for CABG at this time and recommended medical management. Patient started on aspirin, Plavix, Imdur, Toprol and atorvastatin. Echocardiogram showed normal EF. - pt. Was discharged on medical regimen as above with Cardiology follow up.   *History of dementia without behavioral disturbance - he will cont.  Aricept  *Type 2 diabetes  mellitus. -While the patient was in the hospital he was on sliding scale insulin not being discharged on oral Actos, glipizide.  *BPH -no urinary retention, patient will continue his Flomax.  *Hypokalemia -replaced and resolved.  *Acute kidney chronic kidney disease stage III-patient's renal function was followed while the patient was in the hospital as he was getting IV diuresis.  Creatinine is currently at baseline and can be further followed as an outpatient by his primary care physician.  Patient was seen by physical therapy and the recommended home health services and patient is being discharged with home health physical therapy and nursing services.  DISCHARGE CONDITIONS:   Stable  CONSULTS OBTAINED:  Treatment Team:  Dalia Heading, MD  DRUG ALLERGIES:  No Known Allergies  DISCHARGE MEDICATIONS:   Allergies as of 10/29/2017   No Known Allergies     Medication List    STOP taking these medications   amLODipine 10 MG tablet Commonly known as:  NORVASC     TAKE these medications   aspirin 81 MG chewable tablet Chew 81 mg by mouth daily.   atorvastatin 40 MG tablet Commonly known as:  LIPITOR Take 1 tablet (40 mg total) by mouth daily at 6 PM.   azelastine 0.1 % nasal spray Commonly known as:  ASTELIN Place 1 spray into both nostrils 2 (two) times daily. Use in each nostril as directed   clopidogrel 75 MG tablet Commonly known as:  PLAVIX Take 1 tablet (75 mg total) by mouth daily.   donepezil 10 MG tablet Commonly known as:  ARICEPT Take 10 mg by mouth at bedtime.   furosemide 40 MG tablet Commonly known  as:  LASIX Take 1 tablet (40 mg total) by mouth daily.   glipiZIDE 10 MG tablet Commonly known as:  GLUCOTROL Take 20 mg by mouth 2 (two) times daily before a meal.   isosorbide mononitrate 30 MG 24 hr tablet Commonly known as:  IMDUR Take 1 tablet (30 mg total) by mouth daily.   lisinopril 40 MG tablet Commonly known as:   PRINIVIL,ZESTRIL Take 40 mg by mouth daily.   memantine 5 MG tablet Commonly known as:  NAMENDA Take 5 mg by mouth 2 (two) times daily.   metoprolol succinate 25 MG 24 hr tablet Commonly known as:  TOPROL-XL Take 1 tablet (25 mg total) by mouth daily.   multivitamin with minerals tablet Take 1 tablet by mouth daily.   omega-3 acid ethyl esters 1 g capsule Commonly known as:  LOVAZA Take 2 g by mouth daily.   pioglitazone 30 MG tablet Commonly known as:  ACTOS Take 30 mg by mouth daily.   Potassium 99 MG Tabs Take 1 tablet by mouth daily.   tamsulosin 0.4 MG Caps capsule Commonly known as:  FLOMAX Take 0.8 mg by mouth at bedtime.         DISCHARGE INSTRUCTIONS:   DIET:  Cardiac diet and Diabetic diet  DISCHARGE CONDITION:  Stable  ACTIVITY:  Activity as tolerated  OXYGEN:  Home Oxygen: No.   Oxygen Delivery: room air  DISCHARGE LOCATION:  Home with Home Health PT, RN, OT.    If you experience worsening of your admission symptoms, develop shortness of breath, life threatening emergency, suicidal or homicidal thoughts you must seek medical attention immediately by calling 911 or calling your MD immediately  if symptoms less severe.  You Must read complete instructions/literature along with all the possible adverse reactions/side effects for all the Medicines you take and that have been prescribed to you. Take any new Medicines after you have completely understood and accpet all the possible adverse reactions/side effects.   Please note  You were cared for by a hospitalist during your hospital stay. If you have any questions about your discharge medications or the care you received while you were in the hospital after you are discharged, you can call the unit and asked to speak with the hospitalist on call if the hospitalist that took care of you is not available. Once you are discharged, your primary care physician will handle any further medical issues. Please  note that NO REFILLS for any discharge medications will be authorized once you are discharged, as it is imperative that you return to your primary care physician (or establish a relationship with a primary care physician if you do not have one) for your aftercare needs so that they can reassess your need for medications and monitor your lab values.     Today   Shortness of breath much improved, no chest pain.  VITAL SIGNS:  Blood pressure (!) 126/53, pulse 64, temperature 97.9 F (36.6 C), resp. rate 18, height 5\' 7"  (1.702 m), weight 75.4 kg (166 lb 3.2 oz), SpO2 (!) 89 %.  I/O:  No intake or output data in the 24 hours ending 10/30/17 1549  PHYSICAL EXAMINATION:    GENERAL:  83 y.o.-year-old patient lying in the bed with no acute distress.  EYES: Pupils equal, round, reactive to light and accommodation. No scleral icterus. Extraocular muscles intact.  HEENT: Head atraumatic, normocephalic. Oropharynx and nasopharynx clear.  NECK:  Supple, no jugular venous distention. No thyroid enlargement, no tenderness.  LUNGS: Normal breath sounds bilaterally, no wheezing, rales, rhonchi. No use of accessory muscles of respiration.  CARDIOVASCULAR: S1, S2 normal. No murmurs, rubs, or gallops.  ABDOMEN: Soft, nontender, nondistended. Bowel sounds present. No organomegaly or mass.  EXTREMITIES: No cyanosis, clubbing or edema b/l.    NEUROLOGIC: Cranial nerves II through XII are intact. No focal Motor or sensory deficits b/l.   PSYCHIATRIC:  patient is alert and oriented x 3.  SKIN: No obvious rash, lesion, or ulcer.   DATA REVIEW:   CBC Recent Labs  Lab 10/28/17 0355  WBC 11.0*  HGB 10.3*  HCT 29.3*  PLT 271    Chemistries  Recent Labs  Lab 10/27/17 0439 10/28/17 0355  NA 138 139  K 4.0 3.7  CL 105 105  CO2 24 26  GLUCOSE 261* 128*  BUN 61* 59*  CREATININE 1.53* 1.56*  CALCIUM 8.5* 8.3*  MG 2.3  --     Cardiac Enzymes Recent Labs  Lab 10/27/17 0439  TROPONINI 4.75*     Microbiology Results  Results for orders placed or performed during the hospital encounter of 10/22/17  Blood culture (routine x 2)     Status: None   Collection Time: 10/22/17  8:36 PM  Result Value Ref Range Status   Specimen Description BLOOD RIGHT ANTECUBITAL  Final   Special Requests   Final    BOTTLES DRAWN AEROBIC AND ANAEROBIC Blood Culture adequate volume   Culture   Final    NO GROWTH 5 DAYS Performed at [facility], [address], [place], [place] [address]    Report Status 10/27/2017 FINAL  Final  Blood culture (routine x 2)     Status: None   Collection Time: 10/22/17  8:36 PM  Result Value Ref Range Status   Specimen Description BLOOD RIGHT ANTECUBITAL  Final   Special Requests   Final    BOTTLES DRAWN AEROBIC AND ANAEROBIC Blood Culture adequate volume   Culture   Final    NO GROWTH 5 DAYS Performed at [facility], [address] [place], [place] [address]    Report Status 10/27/2017 FINAL  Final  Urine Culture     Status: None   Collection Time: 10/22/17  9:32 PM  Result Value Ref Range Status   Specimen Description   Final    URINE, RANDOM Performed at [facility], [address], [place], [place] [address]    Special Requests   Final    Normal Performed at [facility], [address], [place], [place] [address]    Culture   Final    NO GROWTH Performed at [facility], [address], [place], [place] [address]    Report Status 10/25/2017 FINAL  Final  MRSA PCR Screening     Status: Abnormal   Collection Time: 10/26/17  4:23 PM  Result Value Ref Range Status   MRSA by PCR POSITIVE (A) NEGATIVE Final    Comment:        The GeneXpert MRSA Assay (FDA approved for NASAL specimens only), is one component of a comprehensive MRSA colonization surveillance program. It is not intended to diagnose MRSA infection nor to guide or monitor treatment for MRSA infections. RESULT CALLED TO, READ BACK BY  AND VERIFIED WITH: CHERYL GREEN 10/26/17 AT 1823 BY HS Performed at [facility], [address], [place], [place] [address]     RADIOLOGY:  No results found.    Management plans discussed with the  patient, family and they are in agreement.  CODE STATUS:  Code Status History    Date Active Date Inactive Code Status Order ID Comments User Context   10/22/2017 2334 10/29/2017 1556 Full Code [id number]  Cammy Copa, MD Inpatient     TOTAL TIME TAKING CARE OF THIS PATIENT: 40 minutes.    Houston Siren M.D on 10/30/2017 at 3:49 PM  Between 7am to 6pm - Pager - [phone]  After 6pm go to [email or web address] - Social research officer, government  [facility] [place] [facility]  Office  [phone]  CC: Primary care physician; Leotis Shames, MD

## 2018-02-25 ENCOUNTER — Other Ambulatory Visit (HOSPITAL_COMMUNITY): Payer: Self-pay | Admitting: Internal Medicine

## 2018-02-25 ENCOUNTER — Other Ambulatory Visit: Payer: Self-pay | Admitting: Internal Medicine

## 2018-02-25 DIAGNOSIS — F0280 Dementia in other diseases classified elsewhere without behavioral disturbance: Secondary | ICD-10-CM

## 2018-02-25 DIAGNOSIS — G301 Alzheimer's disease with late onset: Principal | ICD-10-CM

## 2019-01-13 ENCOUNTER — Encounter: Payer: Self-pay | Admitting: Emergency Medicine

## 2019-01-13 ENCOUNTER — Emergency Department
Admission: EM | Admit: 2019-01-13 | Discharge: 2019-01-13 | Disposition: A | Discharge status: Home / Self Care | Payer: Medicare Other | Attending: Student in an Organized Health Care Education/Training Program | Admitting: Student in an Organized Health Care Education/Training Program

## 2019-01-13 ENCOUNTER — Other Ambulatory Visit: Payer: Self-pay

## 2019-01-13 ENCOUNTER — Emergency Department: Payer: Medicare Other

## 2019-01-13 DIAGNOSIS — Z7984 Long term (current) use of oral hypoglycemic drugs: Secondary | ICD-10-CM | POA: Diagnosis not present

## 2019-01-13 DIAGNOSIS — I252 Old myocardial infarction: Secondary | ICD-10-CM | POA: Diagnosis not present

## 2019-01-13 DIAGNOSIS — F0390 Unspecified dementia without behavioral disturbance: Secondary | ICD-10-CM | POA: Diagnosis not present

## 2019-01-13 DIAGNOSIS — Z7982 Long term (current) use of aspirin: Secondary | ICD-10-CM | POA: Insufficient documentation

## 2019-01-13 DIAGNOSIS — Z79899 Other long term (current) drug therapy: Secondary | ICD-10-CM | POA: Diagnosis not present

## 2019-01-13 DIAGNOSIS — I10 Essential (primary) hypertension: Secondary | ICD-10-CM | POA: Diagnosis not present

## 2019-01-13 DIAGNOSIS — M6281 Muscle weakness (generalized): Secondary | ICD-10-CM | POA: Insufficient documentation

## 2019-01-13 DIAGNOSIS — E119 Type 2 diabetes mellitus without complications: Secondary | ICD-10-CM | POA: Insufficient documentation

## 2019-01-13 DIAGNOSIS — R350 Frequency of micturition: Secondary | ICD-10-CM | POA: Diagnosis not present

## 2019-01-13 DIAGNOSIS — R531 Weakness: Secondary | ICD-10-CM | POA: Insufficient documentation

## 2019-01-13 LAB — URINALYSIS, COMPLETE (UACMP) WITH MICROSCOPIC
Bacteria, UA: NONE SEEN
Bilirubin Urine: NEGATIVE
Glucose, UA: NEGATIVE mg/dL
Hgb urine dipstick: NEGATIVE
Ketones, ur: NEGATIVE mg/dL
Leukocytes,Ua: NEGATIVE
Nitrite: NEGATIVE
Protein, ur: NEGATIVE mg/dL
Specific Gravity, Urine: 1.013 (ref 1.005–1.030)
pH: 6 (ref 5.0–8.0)

## 2019-01-13 LAB — BASIC METABOLIC PANEL
Anion gap: 7 (ref 5–15)
BUN: 20 mg/dL (ref 8–23)
CO2: 27 mmol/L (ref 22–32)
Calcium: 8.8 mg/dL — ABNORMAL LOW (ref 8.9–10.3)
Chloride: 106 mmol/L (ref 98–111)
Creatinine, Ser: 1.13 mg/dL (ref 0.61–1.24)
GFR calc Af Amer: >60 mL/min (ref >60)
GFR calc non Af Amer: >60 mL/min (ref >60)
Glucose, Bld: 136 mg/dL — ABNORMAL HIGH (ref 70–99)
Potassium: 3.3 mmol/L — ABNORMAL LOW (ref 3.5–5.1)
Sodium: 140 mmol/L (ref 135–145)

## 2019-01-13 LAB — CBC
HCT: 33.2 % — ABNORMAL LOW (ref 39.0–52.0)
Hemoglobin: 11.1 g/dL — ABNORMAL LOW (ref 13.0–17.0)
MCH: 30.5 pg (ref 26.0–34.0)
MCHC: 33.4 g/dL (ref 30.0–36.0)
MCV: 91.2 fL (ref 80.0–100.0)
Platelets: 146 10*3/uL — ABNORMAL LOW (ref 150–400)
RBC: 3.64 MIL/uL — ABNORMAL LOW (ref 4.22–5.81)
RDW: 14.3 % (ref 11.5–15.5)
WBC: 5 10*3/uL (ref 4.0–10.5)
nRBC: 0 % (ref 0.0–0.2)

## 2019-01-13 MED ORDER — SODIUM CHLORIDE 0.9% FLUSH: 3.0000 mL | Freq: Once | INTRAVENOUS | Status: DC

## 2019-01-13 NOTE — ED Provider Notes (Signed)
I did not evaluate the patient. This note is to help provide patient a wheelchair per Dr. Ruffin Frederick order. His comment section on the order stated,  "Patient suffers from generalized weakness and dementia which impairs their ability to perform daily activities like bathing, dressing, feeding, grooming and toileting in the home.  A cane, crutch or walker will not resolve  issue with performing activities of daily living. A wheelchair will allow patient to safely perform daily activities. Patient is not able to propel themselves in the home using a standard weight wheelchair due to general weakness. Patient can self propel in the lightweight wheelchair. Length of need Lifetime. Accessories: elevating leg rests (ELRs), wheel locks, extensions and anti-tippers."   Nance Pear, MD 01/13/19 1614

## 2019-01-13 NOTE — ED Provider Notes (Signed)
[facility] Emergency Department Provider Note    First MD Initiated Contact with Patient 01/13/19 [phone]     (approximate)  I have reviewed the triage vital signs and the nursing notes.   HISTORY  Chief Complaint Weakness  Level V Caveat:  AMS - dementia  HPI Oneita Jollylvin Wrightson is a 84 y.o. male   pleasantly demented gentleman with below listed past medical history who presents to the ER after family members called EMS due to several days of confusion and change in behavior.  No agitation or violent behavior reported.  They are reporting increased urinary frequency and her concern for urinary tract infection.  The patient denies any pain or discomfort.  He is unsure as to why he was brought to the ER.  Not able to provide much additional history.   Past Medical History:  Diagnosis Date  . Diabetes mellitus without complication (HCC)   . Hypertension    No family history on file. Past Surgical History:  Procedure Laterality Date  . APPENDECTOMY    . LEFT HEART CATH AND CORONARY ANGIOGRAPHY N/A 10/26/2017   Procedure: LEFT HEART CATH AND CORONARY ANGIOGRAPHY;  Surgeon: Dalia HeadingFath, Kenneth A, MD;  Location: [facility] INVASIVE CV LAB;  Service: Cardiovascular;  Laterality: N/A;   Patient Active Problem List   Diagnosis Date Noted  . NSTEMI (non-ST elevated myocardial infarction) (HCC) 10/22/2017      Prior to Admission medications   Medication Sig Start Date End Date Taking? Authorizing Provider  aspirin 81 MG chewable tablet Chew 81 mg by mouth daily.    [provider]  atorvastatin (LIPITOR) 40 MG tablet Take 1 tablet (40 mg total) by mouth daily at 6 PM. 10/29/17 12/28/17  Sainani, Rolly PancakeVivek J, MD  azelastine (ASTELIN) 0.1 % nasal spray Place 1 spray into both nostrils 2 (two) times daily. Use in each nostril as directed    [provider]  donepezil (ARICEPT) 10 MG tablet Take 10 mg by mouth at bedtime.    [provider]  furosemide (LASIX) 40  MG tablet Take 1 tablet (40 mg total) by mouth daily. 10/30/17 12/29/17  Houston SirenSainani, Vivek J, MD  glipiZIDE (GLUCOTROL) 10 MG tablet Take 20 mg by mouth 2 (two) times daily before a meal.    [provider]  isosorbide mononitrate (IMDUR) 30 MG 24 hr tablet Take 1 tablet (30 mg total) by mouth daily. 10/30/17 12/29/17  Houston SirenSainani, Vivek J, MD  lisinopril (PRINIVIL,ZESTRIL) 40 MG tablet Take 40 mg by mouth daily.    [provider]  memantine (NAMENDA) 5 MG tablet Take 5 mg by mouth 2 (two) times daily. 08/30/17   [provider]  metoprolol succinate (TOPROL-XL) 25 MG 24 hr tablet Take 1 tablet (25 mg total) by mouth daily. 10/30/17 12/29/17  Houston SirenSainani, Vivek J, MD  Multiple Vitamins-Minerals (MULTIVITAMIN WITH MINERALS) tablet Take 1 tablet by mouth daily.    [provider]  omega-3 acid ethyl esters (LOVAZA) 1 g capsule Take 2 g by mouth daily.    [provider]  pioglitazone (ACTOS) 30 MG tablet Take 30 mg by mouth daily. 10/11/17   [provider]  Potassium 99 MG TABS Take 1 tablet by mouth daily.    [provider]  tamsulosin (FLOMAX) 0.4 MG CAPS capsule Take 0.8 mg by mouth at bedtime.    [provider]    Allergies Patient has no known allergies.    Social History Social History   Tobacco Use  .  Smoking status: Never Smoker  . Smokeless tobacco: Never Used  Substance Use Topics  . Alcohol use: No  . Drug use: No    Review of Systems Patient denies headaches, rhinorrhea, blurry vision, numbness, shortness of breath, chest pain, edema, cough, abdominal pain, nausea, vomiting, diarrhea, dysuria, fevers, rashes or hallucinations unless otherwise stated above in HPI. ____________________________________________   PHYSICAL EXAM:  VITAL SIGNS: Vitals:   01/13/19 1210 01/13/19 1230  BP:  (!) 175/83  Pulse: 60 60  Resp: 15 11  Temp:    SpO2: 98% 99%    Constitutional: Alert, pleasant and in NAD Eyes:  Conjunctivae are normal.  Head: Atraumatic. Nose: No congestion/rhinnorhea. Mouth/Throat: Mucous membranes are moist.   Neck: No stridor. Painless ROM.  Cardiovascular: Normal rate, regular rhythm. Grossly normal heart sounds.  Good peripheral circulation. Respiratory: Normal respiratory effort.  No retractions. Lungs CTAB. Gastrointestinal: Soft and nontender. No distention. No abdominal bruits. No CVA tenderness. Genitourinary:  Musculoskeletal: No lower extremity tenderness nor edema.  No joint effusions. Neurologic:  Normal speech and language. No gross focal neurologic deficits are appreciated. No facial droop Skin:  Skin is warm, dry and intact. No rash noted. Psychiatric: Mood and affect are normal. Speech and behavior are normal.  ____________________________________________   LABS (all labs ordered are listed, but only abnormal results are displayed)  Results for orders placed or performed during the hospital encounter of 01/13/19 (from the past 24 hour(s))  Basic metabolic panel     Status: Abnormal   Collection Time: 01/13/19  9:10 AM  Result Value Ref Range   Sodium 140 135 - 145 mmol/L   Potassium 3.3 (L) 3.5 - 5.1 mmol/L   Chloride 106 98 - 111 mmol/L   CO2 27 22 - 32 mmol/L   Glucose, Bld 136 (H) 70 - 99 mg/dL   BUN 20 8 - 23 mg/dL   Creatinine, Ser 1.13 0.61 - 1.24 mg/dL   Calcium 8.8 (L) 8.9 - 10.3 mg/dL   GFR calc non Af Amer >60 >60 mL/min   GFR calc Af Amer >60 >60 mL/min   Anion gap 7 5 - 15  CBC     Status: Abnormal   Collection Time: 01/13/19  9:10 AM  Result Value Ref Range   WBC 5.0 4.0 - 10.5 K/uL   RBC 3.64 (L) 4.22 - 5.81 MIL/uL   Hemoglobin 11.1 (L) 13.0 - 17.0 g/dL   HCT 33.2 (L) 39.0 - 52.0 %   MCV 91.2 80.0 - 100.0 fL   MCH 30.5 26.0 - 34.0 pg   MCHC 33.4 30.0 - 36.0 g/dL   RDW 14.3 11.5 - 15.5 %   Platelets 146 (L) 150 - 400 K/uL   nRBC 0.0 0.0 - 0.2 %  Urinalysis, Complete w Microscopic     Status: Abnormal   Collection Time: 01/13/19  10:35 AM  Result Value Ref Range   Color, Urine YELLOW (A) YELLOW   APPearance CLEAR (A) CLEAR   Specific Gravity, Urine 1.013 1.005 - 1.030   pH 6.0 5.0 - 8.0   Glucose, UA NEGATIVE NEGATIVE mg/dL   Hgb urine dipstick NEGATIVE NEGATIVE   Bilirubin Urine NEGATIVE NEGATIVE   Ketones, ur NEGATIVE NEGATIVE mg/dL   Protein, ur NEGATIVE NEGATIVE mg/dL   Nitrite NEGATIVE NEGATIVE   Leukocytes,Ua NEGATIVE NEGATIVE   RBC / HPF 0-5 0 - 5 RBC/hpf   WBC, UA 0-5 0 - 5 WBC/hpf   Bacteria, UA NONE SEEN NONE SEEN   Squamous Epithelial /  LPF 0-5 0 - 5   Mucus PRESENT    ____________________________________________  EKG My review and personal interpretation at Time: 9:01   Indication: AMS  Rate: 60  Rhythm: sinus Axis: left Other: lbbb, no sgarbossa, abnml ekg, no significant changes from previous tracing ____________________________________________  RADIOLOGY  I personally reviewed all radiographic images ordered to evaluate for the above acute complaints and reviewed radiology reports and findings.  These findings were personally discussed with the patient.  Please see medical record for radiology report.  ____________________________________________   PROCEDURES  Procedure(s) performed:  Procedures    Critical Care performed: no ____________________________________________   INITIAL IMPRESSION / ASSESSMENT AND PLAN / ED COURSE  Pertinent labs & imaging results that were available during my care of the patient were reviewed by me and considered in my medical decision making (see chart for details).   DDX: Dehydration, sepsis, pna, uti, hypoglycemia, cva, drug effect, withdrawal, encephalitis   Dequarius Arno is a 84 y.o. who presents to the ED with symptoms as described above.  Seems to be having steady deterioration now having difficulty completing ADLs.  Will order blood work as well as imaging to evaluate for any evidence of acute metabolic or medical condition.  Suspect  deconditioning.  No lateralizing weakness to suggest stroke.  The patient will be placed on continuous pulse oximetry and telemetry for monitoring.  Laboratory evaluation will be sent to evaluate for the above complaints.     Clinical Course as of Jan 13 1255  Mon Jan 13, 2019  1236 Discussed case with family.  Having difficulty completing ADLs over the past several days.  Does not seem consistent with stroke.  Patient states that he feels fine but does feel generalized weakness.  No evidence of infectious or metabolic process to explain his change.  Will have patient evaluated by physical therapy for home health equipment needs.   [PR]    Clinical Course User Index [PR] Willy Eddy, MD    The patient was evaluated in Emergency Department today for the symptoms described in the history of present illness. He/she was evaluated in the context of the global COVID-19 pandemic, which necessitated consideration that the patient might be at risk for infection with the SARS-CoV-2 virus that causes COVID-19. Institutional protocols and algorithms that pertain to the evaluation of patients at risk for COVID-19 are in a state of rapid change based on information released by regulatory bodies including the CDC and federal and state organizations. These policies and algorithms were followed during the patient's care in the ED.  As part of my medical decision making, I reviewed the following data within the electronic MEDICAL RECORD NUMBER Nursing notes reviewed and incorporated, Labs reviewed, notes from prior ED visits and [place] Controlled Substance Database   ____________________________________________   FINAL CLINICAL IMPRESSION(S) / ED DIAGNOSES  Final diagnoses:  Weakness      NEW MEDICATIONS STARTED DURING THIS VISIT:  New Prescriptions   No medications on file     Note:  This document was prepared using Dragon voice recognition software and may include unintentional dictation errors.     Willy Eddy, MD 01/13/19 1256

## 2019-01-13 NOTE — Evaluation (Signed)
Physical Therapy Evaluation Patient Details Name: Jesus [name] MRN: [id number] DOB: Sep 04, 1934 Today's Date: 01/13/2019   History of Present Illness  presented to ER secondary to weakness, AMS and family concern for UTI.  Clinical Impression  Upon evaluation, patient alert and oriented to self only; follows simple commands, pleasant and cooperative throughout evaluation.  Bilat UE/LE strength and ROM grossly symmetrical and WFL; no focal weakness, sensory or coordination deficits appreciated.  Currently requiring min assist for bed mobility; min assist for sit/stand, basic transfers and gait (40') without assist device. Demonstrates narrowed BOS with inconsistent L foot clearance; excessive R lateral sway at times, absent trunk rotation and arm swing.  Limited balance reaction, mild/mod gati deviations with dynamic gait components (head turns, obstacle negotiation). All mobility improved to cga level of assist, with noted improvement in comfort/confidence of mobility tasks, with use of RW.  Do recommend continued use of RW with all mobility at this time.  Patient voices agreement; anticipate cuing from caregiver required for consistent use in home environment. Would benefit from skilled PT to address above deficits and promote optimal return to PLOF; Recommend transition to [place] upon discharge from acute hospitalization.      Follow Up Recommendations Home health PT;Supervision/Assistance - 24 hour    Equipment Recommendations  Rolling walker with 5" wheels    Recommendations for Other Services       Precautions / Restrictions Precautions Precautions: Fall Restrictions Weight Bearing Restrictions: No      Mobility  Bed Mobility Overal bed mobility: Needs Assistance Bed Mobility: Supine to Sit     Supine to sit: Min assist        Transfers Overall transfer level: Needs assistance   Transfers: Sit to/from Stand Sit to Stand: Min guard;Min assist         General transfer  comment: min assist for balance in A/P plane  Ambulation/Gait Ambulation/Gait assistance: Min assist Gait Distance (Feet): 40 Feet Assistive device: None       General Gait Details: narrowed BOS with inconsistent L foot clearance; excessive R lateral sway at times, absent trunk rotation and arm swing.  Limited balance reaction, mild/mod gati deviations with dynamic gait components (head turns, obstacle negotiation)  Stairs            Wheelchair Mobility    Modified Rankin (Stroke Patients Only)       Balance Overall balance assessment: Needs assistance Sitting-balance support: Feet supported;No upper extremity supported Sitting balance-Leahy Scale: Good     Standing balance support: No upper extremity supported Standing balance-Leahy Scale: Fair                               Pertinent Vitals/Pain      Home Living Family/patient expects to be discharged to:: Private residence Living Arrangements: Spouse/significant other Available Help at Discharge: Family             Additional Comments: Patient poor historian; per chart (CSW), patient lives with wife    Prior Function           Comments: Patient poor historian, unable to provide reliable history.  Does not appear to indicate use of assist device at baseline.  Asked per EDP to assess need for AD     Hand Dominance        Extremity/Trunk Assessment   Upper Extremity Assessment Upper Extremity Assessment: (grossly 4/5 throughout)    Lower Extremity Assessment Lower Extremity Assessment: (grossly  4/5 throughout; optimized with demonstration from therapist)       Communication   Communication: No difficulties  Cognition Arousal/Alertness: Awake/alert Behavior During Therapy: WFL for tasks assessed/performed Overall Cognitive Status: No family/caregiver present to determine baseline cognitive functioning                                 General Comments: Oriented to  self only; pleasant and cooperative, follows simple commands consistenly      General Comments      Exercises Other Exercises Other Exercises: 16' with RW, cga-improved comfort/confidence with use of RW; improved balance and overall safety.  do recommend continued use of RW at this time.   Assessment/Plan    PT Assessment Patient needs continued PT services  PT Problem List Decreased activity tolerance;Decreased balance;Decreased mobility;Decreased cognition;Decreased safety awareness;Decreased knowledge of use of DME       PT Treatment Interventions DME instruction;Gait training;Functional mobility training;Therapeutic activities;Therapeutic exercise;Balance training;Cognitive remediation    PT Goals (Current goals can be found in the Care Plan section)  Acute Rehab PT Goals Patient Stated Goal: to get back home PT Goal Formulation: With patient Time For Goal Achievement: 01/27/19 Potential to Achieve Goals: Fair    Frequency Min 2X/week   Barriers to discharge        Co-evaluation               AM-PAC PT "6 Clicks" Mobility  Outcome Measure Help needed turning from your back to your side while in a flat bed without using bedrails?: A Little Help needed moving from lying on your back to sitting on the side of a flat bed without using bedrails?: A Little Help needed moving to and from a bed to a chair (including a wheelchair)?: A Little Help needed standing up from a chair using your arms (e.g., wheelchair or bedside chair)?: A Little Help needed to walk in hospital room?: A Little Help needed climbing 3-5 steps with a railing? : A Little 6 Click Score: 18    End of Session Equipment Utilized During Treatment: Gait belt Activity Tolerance: Patient tolerated treatment well Patient left: in bed;with call bell/phone within reach Nurse Communication: Mobility status PT Visit Diagnosis: Difficulty in walking, not elsewhere classified (R26.2);Muscle weakness  (generalized) (M62.81)    Time: 8916-9450 PT Time Calculation (min) (ACUTE ONLY): 18 min   Charges:   PT Evaluation $PT Eval Moderate Complexity: 1 Mod PT Treatments $Gait Training: 8-22 mins        Chapin Arduini H. Manson Passey, PT, DPT, NCS 01/13/19, 4:43 PM [phone]

## 2019-01-13 NOTE — ED Notes (Signed)
Brother at bedside 

## 2019-01-13 NOTE — TOC Initial Note (Signed)
Transition of Care [facility]) - Initial/Assessment Note    Patient Details  Name: Jesus [name] MRN: [id number] Date of Birth: 04-May-1934  Transition of Care [facility]) CM/SW Contact:    Fredric Mare, LCSW Phone Number: 01/13/2019, 2:01 PM  Clinical Narrative:                  Patient is an 84 year old male that presents to the ED for weakness. Patient has dementia. CSW spoke with patient's guardian and brother, Tarri Fuller.  Patient currenrtly lives in a single family home with [place], and Clinton's wife. Clinton stated that he is interested in patient receiving home health services, and is in need of a wheelchair. Clinton shared that they have used [facility] in the past, and would like to use their services again.  Jana Half at [facility] was informed of referral.  EDP was notified to order face to face and home health. EDP and [employer] were notified of need for wheelchair.    Expected Discharge Plan: [place] Barriers to Discharge: Continued Medical Work up   Patient Goals and CMS Choice        Expected Discharge Plan and Services Expected Discharge Plan: [place]   Discharge Planning Services: CM Consult Post Acute Care Choice: Home Health, Durable Medical Equipment(Patient's family requesting wheelchair, and home health) Living arrangements for the past 2 months: [place] Agency: [facility] Date [place]: 01/13/19 Time [place]: [id number] Representative spoke with at [place]: Jana Half  Prior Living Arrangements/Services Living arrangements for the past 2 months: [place] with:: Siblings(patient lives with brother, and sister in [place]) Patient language and need for interpreter reviewed:: Yes Do you feel safe going back to the place where you live?: Yes      Need for Family Participation in Patient Care: Yes (Comment) Care giver  support system in place?: Yes (comment)   Criminal Activity/Legal Involvement Pertinent to Current Situation/Hospitalization: No - Comment as needed  Activities of Daily Living      Permission Sought/Granted Permission sought to share information with : Guardian                Emotional Assessment           Psych Involvement: No (comment)  Admission diagnosis:  altered mental status ems Patient Active Problem List   Diagnosis Date Noted  . NSTEMI (non-ST elevated myocardial infarction) ([place]) 10/22/2017   PCP:  Glendon Axe, MD Pharmacy:   [place], [address] AT [address] [place] [address] Phone: [phone] Fax: [place] ([place]), [place] - [address] [place]) [place] [address] Phone: [phone] Fax: [phone]     Social Determinants of Health (SDOH) Interventions    Readmission Risk Interventions No flowsheet data found.

## 2019-01-13 NOTE — ED Notes (Signed)
Reviewed pt's discharge information and follow-up care instructions with pt's legal guardian/brother Clinton Schroeck. New wheel chair placed in brother's car. Brother states pt already has a rolling walker at home. Pt discharged in stable condition per EDP.

## 2019-01-13 NOTE — ED Triage Notes (Signed)
Pt via ems from home with weakness x 3-4 days. Pt has dementia, but family reports altered mental status x 3-4 days as well as increased urinary frequency. Pt alert and oriented to self and place. Denies pain; nad noted.

## 2019-01-22 ENCOUNTER — Emergency Department: Payer: Medicare Other

## 2019-01-22 ENCOUNTER — Inpatient Hospital Stay
Admission: EM | Admit: 2019-01-22 | Discharge: 2019-01-22 | DRG: 177 | Disposition: A | Discharge status: Other Acute Inpatient Hospital | Payer: Medicare Other | Attending: Internal Medicine | Admitting: Internal Medicine

## 2019-01-22 ENCOUNTER — Other Ambulatory Visit: Payer: Self-pay

## 2019-01-22 ENCOUNTER — Encounter (HOSPITAL_COMMUNITY): Payer: Self-pay

## 2019-01-22 ENCOUNTER — Inpatient Hospital Stay (HOSPITAL_COMMUNITY)
Admission: AD | Admit: 2019-01-22 | Discharge: 2019-02-05 | DRG: 177 | Disposition: A | Discharge status: Skilled Nursing Facility | Payer: Medicare Other | Source: Other Acute Inpatient Hospital | Attending: Internal Medicine | Admitting: Internal Medicine

## 2019-01-22 DIAGNOSIS — N179 Acute kidney failure, unspecified: Secondary | ICD-10-CM | POA: Diagnosis present

## 2019-01-22 DIAGNOSIS — H9190 Unspecified hearing loss, unspecified ear: Secondary | ICD-10-CM | POA: Diagnosis present

## 2019-01-22 DIAGNOSIS — J1289 Other viral pneumonia: Secondary | ICD-10-CM | POA: Diagnosis present

## 2019-01-22 DIAGNOSIS — F0390 Unspecified dementia without behavioral disturbance: Secondary | ICD-10-CM | POA: Diagnosis present

## 2019-01-22 DIAGNOSIS — R609 Edema, unspecified: Secondary | ICD-10-CM | POA: Diagnosis not present

## 2019-01-22 DIAGNOSIS — J129 Viral pneumonia, unspecified: Secondary | ICD-10-CM | POA: Diagnosis present

## 2019-01-22 DIAGNOSIS — G9341 Metabolic encephalopathy: Secondary | ICD-10-CM | POA: Diagnosis present

## 2019-01-22 DIAGNOSIS — J9601 Acute respiratory failure with hypoxia: Secondary | ICD-10-CM | POA: Diagnosis present

## 2019-01-22 DIAGNOSIS — G9349 Other encephalopathy: Secondary | ICD-10-CM | POA: Diagnosis not present

## 2019-01-22 DIAGNOSIS — I252 Old myocardial infarction: Secondary | ICD-10-CM | POA: Diagnosis not present

## 2019-01-22 DIAGNOSIS — Z7982 Long term (current) use of aspirin: Secondary | ICD-10-CM

## 2019-01-22 DIAGNOSIS — E872 Acidosis: Secondary | ICD-10-CM | POA: Diagnosis present

## 2019-01-22 DIAGNOSIS — U071 COVID-19: Secondary | ICD-10-CM | POA: Diagnosis present

## 2019-01-22 DIAGNOSIS — E11649 Type 2 diabetes mellitus with hypoglycemia without coma: Secondary | ICD-10-CM | POA: Diagnosis not present

## 2019-01-22 DIAGNOSIS — Z7984 Long term (current) use of oral hypoglycemic drugs: Secondary | ICD-10-CM

## 2019-01-22 DIAGNOSIS — T380X5A Adverse effect of glucocorticoids and synthetic analogues, initial encounter: Secondary | ICD-10-CM | POA: Diagnosis not present

## 2019-01-22 DIAGNOSIS — E1169 Type 2 diabetes mellitus with other specified complication: Secondary | ICD-10-CM | POA: Diagnosis not present

## 2019-01-22 DIAGNOSIS — I10 Essential (primary) hypertension: Secondary | ICD-10-CM | POA: Diagnosis present

## 2019-01-22 DIAGNOSIS — E1165 Type 2 diabetes mellitus with hyperglycemia: Secondary | ICD-10-CM | POA: Diagnosis not present

## 2019-01-22 DIAGNOSIS — Z7902 Long term (current) use of antithrombotics/antiplatelets: Secondary | ICD-10-CM

## 2019-01-22 DIAGNOSIS — J1282 Pneumonia due to coronavirus disease 2019: Secondary | ICD-10-CM | POA: Diagnosis present

## 2019-01-22 DIAGNOSIS — R531 Weakness: Secondary | ICD-10-CM | POA: Diagnosis present

## 2019-01-22 DIAGNOSIS — Z79899 Other long term (current) drug therapy: Secondary | ICD-10-CM

## 2019-01-22 DIAGNOSIS — E119 Type 2 diabetes mellitus without complications: Secondary | ICD-10-CM

## 2019-01-22 DIAGNOSIS — E876 Hypokalemia: Secondary | ICD-10-CM | POA: Diagnosis present

## 2019-01-22 DIAGNOSIS — D649 Anemia, unspecified: Secondary | ICD-10-CM | POA: Diagnosis present

## 2019-01-22 DIAGNOSIS — Z9861 Coronary angioplasty status: Secondary | ICD-10-CM | POA: Diagnosis not present

## 2019-01-22 LAB — COMPREHENSIVE METABOLIC PANEL
ALT: 13 U/L (ref 0–44)
AST: 24 U/L (ref 15–41)
Albumin: 3.5 g/dL (ref 3.5–5.0)
Alkaline Phosphatase: 59 U/L (ref 38–126)
Anion gap: 12 (ref 5–15)
BUN: 19 mg/dL (ref 8–23)
CO2: 21 mmol/L — ABNORMAL LOW (ref 22–32)
Calcium: 8.4 mg/dL — ABNORMAL LOW (ref 8.9–10.3)
Chloride: 106 mmol/L (ref 98–111)
Creatinine, Ser: 1.09 mg/dL (ref 0.61–1.24)
GFR calc Af Amer: >60 mL/min (ref >60)
GFR calc non Af Amer: >60 mL/min (ref >60)
Glucose, Bld: 71 mg/dL (ref 70–99)
Potassium: 3.7 mmol/L (ref 3.5–5.1)
Sodium: 139 mmol/L (ref 135–145)
Total Bilirubin: 0.8 mg/dL (ref 0.3–1.2)
Total Protein: 6.3 g/dL — ABNORMAL LOW (ref 6.5–8.1)

## 2019-01-22 LAB — C-REACTIVE PROTEIN: CRP: 3.8 mg/dL — ABNORMAL HIGH (ref <1.0)

## 2019-01-22 LAB — FIBRINOGEN: Fibrinogen: 419 mg/dL (ref 210–475)

## 2019-01-22 LAB — CBC WITH DIFFERENTIAL/PLATELET
Abs Immature Granulocytes: 0.02 10*3/uL (ref 0.00–0.07)
Basophils Absolute: 0 10*3/uL (ref 0.0–0.1)
Basophils Relative: 0 %
Eosinophils Absolute: 0 10*3/uL (ref 0.0–0.5)
Eosinophils Relative: 0 %
HCT: 35.6 % — ABNORMAL LOW (ref 39.0–52.0)
Hemoglobin: 11.9 g/dL — ABNORMAL LOW (ref 13.0–17.0)
Immature Granulocytes: 1 %
Lymphocytes Relative: 29 %
Lymphs Abs: 1.2 10*3/uL (ref 0.7–4.0)
MCH: 29.8 pg (ref 26.0–34.0)
MCHC: 33.4 g/dL (ref 30.0–36.0)
MCV: 89.2 fL (ref 80.0–100.0)
Monocytes Absolute: 0.2 10*3/uL (ref 0.1–1.0)
Monocytes Relative: 5 %
Neutro Abs: 2.8 10*3/uL (ref 1.7–7.7)
Neutrophils Relative %: 65 %
Platelets: 119 10*3/uL — ABNORMAL LOW (ref 150–400)
RBC: 3.99 MIL/uL — ABNORMAL LOW (ref 4.22–5.81)
RDW: 13.7 % (ref 11.5–15.5)
WBC: 4.2 10*3/uL (ref 4.0–10.5)
nRBC: 0 % (ref 0.0–0.2)

## 2019-01-22 LAB — TRIGLYCERIDES: Triglycerides: 180 mg/dL — ABNORMAL HIGH (ref <150)

## 2019-01-22 LAB — LACTATE DEHYDROGENASE: LDH: 244 U/L — ABNORMAL HIGH (ref 98–192)

## 2019-01-22 LAB — PROCALCITONIN: Procalcitonin: <0.1 ng/mL

## 2019-01-22 LAB — LACTIC ACID, PLASMA: Lactic Acid, Venous: 0.9 mmol/L (ref 0.5–1.9)

## 2019-01-22 LAB — FERRITIN: Ferritin: 397 ng/mL — ABNORMAL HIGH (ref 24–336)

## 2019-01-22 LAB — FIBRIN DERIVATIVES D-DIMER (ARMC ONLY): Fibrin derivatives D-dimer (ARMC): 716.21 ng/mL (FEU) — ABNORMAL HIGH (ref 0.00–499.00)

## 2019-01-22 MED ORDER — DEXAMETHASONE SODIUM PHOSPHATE 10 MG/ML IJ SOLN
6.0000 mg | INTRAMUSCULAR | Status: DC
  Administered 2019-01-22 – 2019-01-28 (×7): 6 mg via INTRAVENOUS
  Filled 2019-01-22 (×7): qty 1

## 2019-01-22 MED ORDER — ONDANSETRON HCL 4 MG/2ML IJ SOLN: 4.0000 mg | Freq: Four times a day (QID) | INTRAMUSCULAR | Status: DC | PRN

## 2019-01-22 MED ORDER — ENOXAPARIN SODIUM 40 MG/0.4ML ~~LOC~~ SOLN
40.0000 mg | SUBCUTANEOUS | Status: DC
  Administered 2019-01-22 – 2019-01-29 (×8): 40 mg via SUBCUTANEOUS
  Filled 2019-01-22 (×7): qty 0.4

## 2019-01-22 MED ORDER — SODIUM CHLORIDE 0.9% FLUSH: 3.0000 mL | INTRAVENOUS | Status: DC | PRN

## 2019-01-22 MED ORDER — SODIUM CHLORIDE 0.9 % IV SOLN: 250.0000 mL | INTRAVENOUS | Status: DC | PRN

## 2019-01-22 MED ORDER — INSULIN ASPART 100 UNIT/ML ~~LOC~~ SOLN
0.0000 [IU] | Freq: Three times a day (TID) | SUBCUTANEOUS | Status: DC
  Administered 2019-01-23: 2 [IU] via SUBCUTANEOUS
  Administered 2019-01-23: 5 [IU] via SUBCUTANEOUS
  Administered 2019-01-23: 2 [IU] via SUBCUTANEOUS
  Administered 2019-01-24: 5 [IU] via SUBCUTANEOUS
  Administered 2019-01-24: 9 [IU] via SUBCUTANEOUS

## 2019-01-22 MED ORDER — VITAMIN C 500 MG PO TABS
500.0000 mg | ORAL_TABLET | Freq: Every day | ORAL | Status: DC
  Administered 2019-01-22 – 2019-02-04 (×14): 500 mg via ORAL
  Filled 2019-01-22 (×14): qty 1

## 2019-01-22 MED ORDER — ACETAMINOPHEN 325 MG PO TABS: 650.0000 mg | ORAL_TABLET | Freq: Four times a day (QID) | ORAL | Status: DC | PRN

## 2019-01-22 MED ORDER — GUAIFENESIN-DM 100-10 MG/5ML PO SYRP: 10.0000 mL | ORAL_SOLUTION | ORAL | Status: DC | PRN

## 2019-01-22 MED ORDER — SODIUM CHLORIDE 0.9% FLUSH
3.0000 mL | Freq: Two times a day (BID) | INTRAVENOUS | Status: DC
  Administered 2019-01-22 – 2019-02-05 (×26): 3 mL via INTRAVENOUS

## 2019-01-22 MED ORDER — ZINC SULFATE 220 (50 ZN) MG PO CAPS
220.0000 mg | ORAL_CAPSULE | Freq: Every day | ORAL | Status: DC
  Administered 2019-01-22 – 2019-02-04 (×14): 220 mg via ORAL
  Filled 2019-01-22 (×14): qty 1

## 2019-01-22 MED ORDER — ONDANSETRON HCL 4 MG PO TABS: 4.0000 mg | ORAL_TABLET | Freq: Four times a day (QID) | ORAL | Status: DC | PRN

## 2019-01-22 MED ORDER — HYDROCOD POLST-CPM POLST ER 10-8 MG/5ML PO SUER: 5.0000 mL | Freq: Two times a day (BID) | ORAL | Status: DC | PRN

## 2019-01-22 NOTE — ED Triage Notes (Signed)
Pt comes from home with positive covid test two days ago. Hx of dementia. Recently not eating or drinking/2 days. Pt VSS. Temp 100.1 with EMS. Daughter states "he needs round the clock care".

## 2019-01-22 NOTE — Progress Notes (Signed)
Acute metabolic encephalopathy in the setting of SARS COVID-19 viral pneumonia.  84 year old male with moderate dementia who had a rapid decline in his functional status for the last 9 days.  Initially evaluated September 28 due to weakness and decreased cognitive function, then he was dischargeded home.  October 5 he tested positive for SARS COVID-19 ([facility]).  He continued to have very poor oral intake at home with further worsening cognitive function.  His oximetry on room air is 97%.  His chest radiograph has a new opacity at the right base.  Patient has been accepted to medical ward.

## 2019-01-22 NOTE — Progress Notes (Signed)
Family thinks pt needs around the clock care and can not take care of him anymore at home

## 2019-01-22 NOTE — ED Notes (Signed)
ED TO INPATIENT HANDOFF REPORT  ED Nurse Name and Phone #: Cala Bradford [phone]  S Name/Age/Gender Jesus [name] 84 y.o. male Room/Bed: ED19A/ED19A  Code Status   Code Status: Prior  Home/SNF/Other Home Patient oriented to: self Is this baseline? Yes   Triage Complete: Triage complete  Chief Complaint dehydration  Triage Note Pt comes from home with positive covid test two days ago. Hx of dementia. Recently not eating or drinking/2 days. Pt VSS. Temp 100.1 with EMS. Daughter states "he needs round the clock care".    Allergies No Known Allergies  Level of Care/Admitting Diagnosis ED Disposition    ED Disposition Condition Comment   Admit  Hospital Area: [facility] [[id number]]  Level of Care: Med-Surg [16]  Covid Evaluation: Confirmed COVID Positive  Diagnosis: Viral pneumonia [[id number]]  Admitting Physician: Coralie Keens [[id number]]  Attending Physician: Coralie Keens [[id number]]  Estimated length of stay: 3 - 4 days  Certification:: I certify this patient will need inpatient services for at least 2 midnights  PT Class (Do Not Modify): Inpatient [101]  PT Acc Code (Do Not Modify): Private [1]       B Medical/Surgery History Past Medical History:  Diagnosis Date  . Diabetes mellitus without complication (HCC)   . Hypertension    Past Surgical History:  Procedure Laterality Date  . APPENDECTOMY    . LEFT HEART CATH AND CORONARY ANGIOGRAPHY N/A 10/26/2017   Procedure: LEFT HEART CATH AND CORONARY ANGIOGRAPHY;  Surgeon: Dalia Heading, MD;  Location: [facility] INVASIVE CV LAB;  Service: Cardiovascular;  Laterality: N/A;     A IV Location/Drains/Wounds Patient Lines/Drains/Airways Status   Active Line/Drains/Airways    Name:   Placement date:   Placement time:   Site:   Days:   Peripheral IV 01/22/19 Left Antecubital   01/22/19    1209    Antecubital   less than 1   Peripheral IV 01/22/19 Left Hand   01/22/19    1225    Hand   less than  1   Peripheral IV 01/22/19 Right Hand   01/22/19    1226    Hand   less than 1          Intake/Output Last 24 hours No intake or output data in the 24 hours ending 01/22/19 1922  Labs/Imaging Results for orders placed or performed during the hospital encounter of 01/22/19 (from the past 48 hour(s))  Lactic acid, plasma     Status: None   Collection Time: 01/22/19 12:12 PM  Result Value Ref Range   Lactic Acid, Venous 0.9 0.5 - 1.9 mmol/L    Comment: Performed at [facility], [address] [place], [place] [address]  CBC WITH DIFFERENTIAL     Status: Abnormal   Collection Time: 01/22/19 12:12 PM  Result Value Ref Range   WBC 4.2 4.0 - 10.5 K/uL   RBC 3.99 (L) 4.22 - 5.81 MIL/uL   Hemoglobin 11.9 (L) 13.0 - 17.0 g/dL   HCT 27.2 (L) 53.6 - 64.4 %   MCV 89.2 80.0 - 100.0 fL   MCH 29.8 26.0 - 34.0 pg   MCHC 33.4 30.0 - 36.0 g/dL   RDW 03.4 74.2 - 59.5 %   Platelets 119 (L) 150 - 400 K/uL    Comment: Immature Platelet Fraction may be clinically indicated, consider ordering this additional test [id number]    nRBC 0.0 0.0 - 0.2 %   Neutrophils Relative % 65 %  Neutro Abs 2.8 1.7 - 7.7 K/uL   Lymphocytes Relative 29 %   Lymphs Abs 1.2 0.7 - 4.0 K/uL   Monocytes Relative 5 %   Monocytes Absolute 0.2 0.1 - 1.0 K/uL   Eosinophils Relative 0 %   Eosinophils Absolute 0.0 0.0 - 0.5 K/uL   Basophils Relative 0 %   Basophils Absolute 0.0 0.0 - 0.1 K/uL   Immature Granulocytes 1 %   Abs Immature Granulocytes 0.02 0.00 - 0.07 K/uL    Comment: Performed at [facility] [address] [place], [place] [address]  Comprehensive metabolic panel     Status: Abnormal   Collection Time: 01/22/19 12:12 PM  Result Value Ref Range   Sodium 139 135 - 145 mmol/L   Potassium 3.7 3.5 - 5.1 mmol/L   Chloride 106 98 - 111 mmol/L   CO2 21 (L) 22 - 32 mmol/L   Glucose, Bld 71 70 - 99 mg/dL   BUN 19 8 - 23 mg/dL   Creatinine, Ser 6.04 0.61 - 1.24 mg/dL   Calcium 8.4 (L) 8.9  - 10.3 mg/dL   Total Protein 6.3 (L) 6.5 - 8.1 g/dL   Albumin 3.5 3.5 - 5.0 g/dL   AST 24 15 - 41 U/L   ALT 13 0 - 44 U/L   Alkaline Phosphatase 59 38 - 126 U/L   Total Bilirubin 0.8 0.3 - 1.2 mg/dL   GFR calc non Af Amer >60 >60 mL/min   GFR calc Af Amer >60 >60 mL/min   Anion gap 12 5 - 15    Comment: Performed at [facility], [address] [place], [place] [address]  Procalcitonin     Status: None   Collection Time: 01/22/19 12:12 PM  Result Value Ref Range   Procalcitonin <0.10 ng/mL    Comment:        Interpretation: PCT (Procalcitonin) <= 0.5 ng/mL: Systemic infection (sepsis) is not likely. Local bacterial infection is possible. (NOTE)       Sepsis PCT Algorithm           Lower Respiratory Tract                                      Infection PCT Algorithm    ----------------------------     ----------------------------         PCT < 0.25 ng/mL                PCT < 0.10 ng/mL         Strongly encourage             Strongly discourage   discontinuation of antibiotics    initiation of antibiotics    ----------------------------     -----------------------------       PCT 0.25 - 0.50 ng/mL            PCT 0.10 - 0.25 ng/mL               OR       >80% decrease in PCT            Discourage initiation of                                            antibiotics  Encourage discontinuation           of antibiotics    ----------------------------     -----------------------------         PCT >= 0.50 ng/mL              PCT 0.26 - 0.50 ng/mL               AND        <80% decrease in PCT             Encourage initiation of                                             antibiotics       Encourage continuation           of antibiotics    ----------------------------     -----------------------------        PCT >= 0.50 ng/mL                  PCT > 0.50 ng/mL               AND         increase in PCT                  Strongly encourage                                       initiation of antibiotics    Strongly encourage escalation           of antibiotics                                     -----------------------------                                           PCT <= 0.25 ng/mL                                                 OR                                        > 80% decrease in PCT                                     Discontinue / Do not initiate                                             antibiotics Performed at [facility], [address] [place], [place] [address]   Lactate dehydrogenase     Status: Abnormal   Collection Time: 01/22/19 12:12 PM  Result Value Ref Range  LDH 244 (H) 98 - 192 U/L    Comment: Performed at [facility], [address] [place], [place] [address]  Ferritin     Status: Abnormal   Collection Time: 01/22/19 12:12 PM  Result Value Ref Range   Ferritin 397 (H) 24 - 336 ng/mL    Comment: Performed at [facility], [address] [place], [place] [address]  Triglycerides     Status: Abnormal   Collection Time: 01/22/19 12:12 PM  Result Value Ref Range   Triglycerides 180 (H) <150 mg/dL    Comment: Performed at [facility], [address] [place], [place] [address]  Fibrin derivatives [facility] only)     Status: Abnormal   Collection Time: 01/22/19 12:24 PM  Result Value Ref Range   Fibrin derivatives D-dimer ([facility]) 716.21 (H) 0.00 - 499.00 ng/mL (FEU)    Comment: (NOTE) <> Exclusion of Venous Thromboembolism (VTE) - OUTPATIENT ONLY   (Emergency Department or [place])   0-499 ng/ml (FEU): With a low to intermediate pretest probability                      for VTE this test result excludes the diagnosis                      of VTE.   >499 ng/ml (FEU) : VTE not excluded; additional work up for VTE is                      required. <> Testing on Inpatients and Evaluation of Disseminated Intravascular   Coagulation (DIC) Reference Range:   0-499 ng/ml (FEU) Performed  at [facility], [address] [place], [place] [address]   Fibrinogen     Status: None   Collection Time: 01/22/19 12:24 PM  Result Value Ref Range   Fibrinogen 419 210 - 475 mg/dL    Comment: Performed at [facility], [address] [place], [place] [address]   Dg Chest Port 1 View  Result Date: 01/22/2019 CLINICAL DATA:  Covid positive. EXAM: PORTABLE CHEST 1 VIEW COMPARISON:  01/13/2019 FINDINGS: 1237 hours. Low lung volumes. Hazy airspace opacity at the right base is new in the interval. No pleural effusion. The cardiopericardial silhouette is within normal limits for size. Left-sided permanent pacemaker noted. Telemetry leads overlie the chest. IMPRESSION: New hazy airspace opacity at the right base compatible with pneumonia. Electronically Signed   By: Misty Stanley M.D.   On: 01/22/2019 13:52    Pending Labs Unresulted Labs (From admission, onward)    Start     Ordered   01/22/19 1212  Blood Culture (routine x 2)  BLOOD CULTURE X 2,   STAT     01/22/19 1211   01/22/19 1212  C-reactive protein  Once,   STAT     01/22/19 1211          Vitals/Pain Today's Vitals   01/22/19 1630 01/22/19 1833 01/22/19 1835 01/22/19 1900  BP: (!) 172/60 138/90  (!) 166/67  Pulse: 61 73  60  Resp: 18 20  17   Temp:   99.2 F (37.3 C)   TempSrc:   Oral   SpO2: 98% 98%  98%  Weight:      Height:      PainSc:        Isolation Precautions Airborne and Contact precautions  Medications Medications - No data to display  Mobility non-ambulatory High fall  risk   Focused Assessments COVID   R Recommendations: See Admitting Provider Note  Report given to:   Additional Notes:

## 2019-01-22 NOTE — ED Notes (Signed)
Pt moved up in bed and situated. TV turned on for patient. No other requests at this time.

## 2019-01-22 NOTE — ED Notes (Signed)
Pt helped to drink a cup of water and eat a handful of chips and two bites of sandwich. Pt placed in new gown and resting with TV on at this time. Pt presents more alert than previously noted.

## 2019-01-22 NOTE — ED Provider Notes (Signed)
[facility] Emergency Department Provider Note   ____________________________________________    I have reviewed the triage vital signs and the nursing notes.   HISTORY  Chief Complaint Weakness  Limited by dementia  HPI Jesus [name] is a 84 y.o. male with a history of diabetes, hypertension brought in by family because of positive COVID diagnosis and inability to care for the patient as he is tremendously weak.  Does have a history of dementia, patient is unable to contribute to HPI.  Able to verify positive COVID result in care everywhere on October 5.  Apparently had a change in mental status approximately 1 week ago  Past Medical History:  Diagnosis Date  . Diabetes mellitus without complication (HCC)   . Hypertension     Patient Active Problem List   Diagnosis Date Noted  . NSTEMI (non-ST elevated myocardial infarction) (HCC) 10/22/2017    Past Surgical History:  Procedure Laterality Date  . APPENDECTOMY    . LEFT HEART CATH AND CORONARY ANGIOGRAPHY N/A 10/26/2017   Procedure: LEFT HEART CATH AND CORONARY ANGIOGRAPHY;  Surgeon: Dalia HeadingFath, Kenneth A, MD;  Location: [facility] INVASIVE CV LAB;  Service: Cardiovascular;  Laterality: N/A;    Prior to Admission medications   Medication Sig Start Date End Date Taking? Authorizing Provider  aspirin 81 MG chewable tablet Chew 81 mg by mouth daily.    [provider]  atorvastatin (LIPITOR) 40 MG tablet Take 1 tablet (40 mg total) by mouth daily at 6 PM. 10/29/17 12/28/17  Sainani, Rolly PancakeVivek J, MD  azelastine (ASTELIN) 0.1 % nasal spray Place 1 spray into both nostrils 2 (two) times daily. Use in each nostril as directed    [provider]  donepezil (ARICEPT) 10 MG tablet Take 10 mg by mouth at bedtime.    [provider]  furosemide (LASIX) 40 MG tablet Take 1 tablet (40 mg total) by mouth daily. 10/30/17 12/29/17  Houston SirenSainani, Vivek J, MD  glipiZIDE (GLUCOTROL) 10 MG tablet Take 20 mg by mouth  2 (two) times daily before a meal.    [provider]  isosorbide mononitrate (IMDUR) 30 MG 24 hr tablet Take 1 tablet (30 mg total) by mouth daily. 10/30/17 12/29/17  [place] SirenSainani, Vivek J, MD  lisinopril (PRINIVIL,ZESTRIL) 40 MG tablet Take 40 mg by mouth daily.    [provider]  memantine (NAMENDA) 5 MG tablet Take 5 mg by mouth 2 (two) times daily. 08/30/17   [provider]  metoprolol succinate (TOPROL-XL) 25 MG 24 hr tablet Take 1 tablet (25 mg total) by mouth daily. 10/30/17 12/29/17  [place] SirenSainani, Vivek J, MD  Multiple Vitamins-Minerals (MULTIVITAMIN WITH MINERALS) tablet Take 1 tablet by mouth daily.    [provider]  omega-3 acid ethyl esters (LOVAZA) 1 g capsule Take 2 g by mouth daily.    [provider]  pioglitazone (ACTOS) 30 MG tablet Take 30 mg by mouth daily. 10/11/17   [provider]  Potassium 99 MG TABS Take 1 tablet by mouth daily.    [provider]  tamsulosin (FLOMAX) 0.4 MG CAPS capsule Take 0.8 mg by mouth at bedtime.    [provider]     Allergies Patient has no known allergies.  History reviewed. No pertinent family history.  Social History Social History   Tobacco Use  . Smoking status: Never Smoker  . Smokeless tobacco: Never Used  Substance Use Topics  . Alcohol use: No  . Drug use: No  Review of Systems  Constitutional: No fever/chills Eyes: No visual changes.  ENT: No sore throat. Cardiovascular: Denies chest pain. Respiratory: Denies shortness of breath. Gastrointestinal: No abdominal pain.  No nausea, no vomiting.   Genitourinary: Negative for dysuria. Musculoskeletal: Negative for back pain. Skin: Negative for rash. Neurological: Negative for headaches or weakness   ____________________________________________   PHYSICAL EXAM:  VITAL SIGNS: ED Triage Vitals  Enc Vitals Group     BP 01/22/19 1207 (!) 165/75     Pulse Rate 01/22/19 1207 62     Resp 01/22/19 1207 17      Temp 01/22/19 1207 99.9 F (37.7 C)     Temp Source 01/22/19 1207 Oral     SpO2 01/22/19 1207 96 %     Weight 01/22/19 1208 80 kg (176 lb 5.9 oz)     Height 01/22/19 1208 1.778 m (5\' 10" )     Head Circumference --      Peak Flow --      Pain Score 01/22/19 1207 0     Pain Loc --      Pain Edu? --      Excl. in [place]? --     Constitutional: Alert, disoriented consistent with history of dementia Eyes: Conjunctivae are normal.  Head: Atraumatic. Nose: No congestion/rhinnorhea. Mouth/Throat: Mucous membranes are dry Neck:  Painless ROM Cardiovascular: Normal rate, regular rhythm. Grossly normal heart sounds.  Good peripheral circulation. Respiratory: Normal respiratory effort.  No retractions. Lungs CTAB. Gastrointestinal: Soft and nontender. No distention.  No CVA tenderness. Musculoskeletal: Warm and well perfused Neurologic:  No gross focal neurologic deficits are appreciated.  Skin:  Skin is warm, dry and intact. No rash noted. Psychiatric: Mood and affect are normal. Speech and behavior are normal.  ____________________________________________   LABS (all labs ordered are listed, but only abnormal results are displayed)  Labs Reviewed  CBC WITH DIFFERENTIAL/PLATELET - Abnormal; Notable for the following components:      Result Value   RBC 3.99 (*)    Hemoglobin 11.9 (*)    HCT 35.6 (*)    Platelets 119 (*)    All other components within normal limits  COMPREHENSIVE METABOLIC PANEL - Abnormal; Notable for the following components:   CO2 21 (*)    Calcium 8.4 (*)    Total Protein 6.3 (*)    All other components within normal limits  LACTATE DEHYDROGENASE - Abnormal; Notable for the following components:   LDH 244 (*)    All other components within normal limits  FERRITIN - Abnormal; Notable for the following components:   Ferritin 397 (*)    All other components within normal limits  TRIGLYCERIDES - Abnormal; Notable for the following components:   Triglycerides  180 (*)    All other components within normal limits  FIBRIN DERIVATIVES D-DIMER ([facility] ONLY) - Abnormal; Notable for the following components:   Fibrin derivatives D-dimer ([facility]) 716.21 (*)    All other components within normal limits  CULTURE, BLOOD (ROUTINE X 2)  CULTURE, BLOOD (ROUTINE X 2)  LACTIC ACID, PLASMA  PROCALCITONIN  FIBRINOGEN  C-REACTIVE PROTEIN   ____________________________________________  EKG  ED ECG REPORT I, Lavonia Drafts, the attending physician, personally viewed and interpreted this ECG.  Date: 01/22/2019  Rhythm: Atrial ventricular paced rhythm QRS Axis: Abnormal Intervals: Abnormal ST/T Wave abnormalities: Abnormal Narrative Interpretation: no evidence of acute ischemia  ____________________________________________  RADIOLOGY  Chest x-ray unremarkable, mild cardiomegaly ____________________________________________   PROCEDURES  Procedure(s) performed: No  Procedures  Critical Care performed: No ____________________________________________   INITIAL IMPRESSION / ASSESSMENT AND PLAN / ED COURSE  Pertinent labs & imaging results that were available during my care of the patient were reviewed by me and considered in my medical decision making (see chart for details).  Patient with positive COVID here with worsening weakness, family unable to care for him.  Overall reassuring exam, patient with dementia has no complaints at this time.  Will require admission for further care  Spoke with patient's brother who notes that prior to a week ago patient was able to ambulate, use the bathroom by himself, eat with very little assistance but had a rapid decline and is no longer able to get out of bed without significant assistance  Lab work today is overall reassuring, has evidence of developing pneumonia on chest x-ray.  Discussed with [facility] hospitalist who has accepted the patient in transfer     ____________________________________________   FINAL CLINICAL IMPRESSION(S) / ED DIAGNOSES  Final diagnoses:  COVID-19   Jesus [name] was evaluated in Emergency Department on 01/22/2019 for the symptoms described in the history of present illness. He was evaluated in the context of the global COVID-19 pandemic, which necessitated consideration that the patient might be at risk for infection with the SARS-CoV-2 virus that causes COVID-19. Institutional protocols and algorithms that pertain to the evaluation of patients at risk for COVID-19 are in a state of rapid change based on information released by regulatory bodies including the CDC and federal and state organizations. These policies and algorithms were followed during the patient's care in the ED.      Note:  This document was prepared using Dragon voice recognition software and may include unintentional dictation errors.   Jene Every, MD 01/22/19 [phone]

## 2019-01-22 NOTE — Plan of Care (Signed)
Pt and family updated and will progress towards goals as needed.

## 2019-01-22 NOTE — ED Notes (Signed)
Patient brief changed. Patient tolerated well.

## 2019-01-22 NOTE — H&P (Signed)
History and Physical    Oneita Jollylvin Almgren ZOX:[id number] DOB: Jul 30, 1934 DOA: 01/22/2019  PCP: Leotis ShamesSingh, Jasmine, MD  Patient coming from:  home  Chief Complaint:  Sob, ams  HPI: Jesus [name] is a 84 y.o. male with medical history significant of dm, htn, dementia sent for pos covid oct 5 and pt has been very weak and not eating well, with worsening confusion and unable to care for at home during this illness.  All history obtained from chart.  cxr shows beginnings of right infiltrate with normal vitals and oxygen sats.  Referred here for admission due to covid infection in high risk patient.   Review of Systems:  Unobtainable due to dementia as with rest of history  Past Medical History:  Diagnosis Date  . Diabetes mellitus without complication (HCC)   . Hypertension     Past Surgical History:  Procedure Laterality Date  . APPENDECTOMY    . LEFT HEART CATH AND CORONARY ANGIOGRAPHY N/A 10/26/2017   Procedure: LEFT HEART CATH AND CORONARY ANGIOGRAPHY;  Surgeon: Dalia HeadingFath, Kenneth A, MD;  Location: [facility] INVASIVE CV LAB;  Service: Cardiovascular;  Laterality: N/A;     reports that he has never smoked. He has never used smokeless tobacco. He reports that he does not drink alcohol or use drugs.  No Known Allergies  No family history on file.  unknown  Prior to Admission medications   Medication Sig Start Date End Date Taking? Authorizing Provider  amLODipine (NORVASC) 10 MG tablet Take 10 mg by mouth daily.    [provider]  aspirin 81 MG chewable tablet Chew 81 mg by mouth daily.    [provider]  clopidogrel (PLAVIX) 75 MG tablet Take 75 mg by mouth daily.    [provider]  donepezil (ARICEPT) 10 MG tablet Take 20 mg by mouth at bedtime.     [provider]  glipiZIDE (GLUCOTROL) 10 MG tablet Take 20 mg by mouth 2 (two) times daily before a meal.    [provider]  isosorbide mononitrate (IMDUR) 30 MG 24 hr tablet Take 1 tablet (30 mg total) by  mouth daily. 10/30/17 01/22/20  [place] SirenSainani, Vivek J, MD  Multiple Vitamins-Minerals (PRESERVISION AREDS 2+MULTI VIT) CAPS Take 1 capsule by mouth at bedtime.    [provider]  omega-3 acid ethyl esters (LOVAZA) 1 g capsule Take 2 g by mouth daily.    [provider]  pioglitazone (ACTOS) 30 MG tablet Take 30 mg by mouth daily. 10/11/17   [provider]  tamsulosin (FLOMAX) 0.4 MG CAPS capsule Take 0.8 mg by mouth at bedtime.    [provider]    Physical Exam: Vitals:   01/22/19 2048  BP: (!) 150/65  Pulse: 67  Resp: 12  Temp: 98.5 F (36.9 C)  TempSrc: Oral  SpO2: 96%  Weight: 71.1 kg  Height: 5\' 7"  (1.702 m)      Constitutional: NAD, calm, comfortable, demented Vitals:   01/22/19 2048  BP: (!) 150/65  Pulse: 67  Resp: 12  Temp: 98.5 F (36.9 C)  TempSrc: Oral  SpO2: 96%  Weight: 71.1 kg  Height: 5\' 7"  (1.702 m)   Eyes: PERRL, lids and conjunctivae normal ENMT: Mucous membranes are moist. Posterior pharynx clear of any exudate or lesions.Normal dentition.  Neck: normal, supple, no masses, no thyromegaly Respiratory: clear to auscultation bilaterally, no wheezing, no crackles. Normal respiratory effort. No accessory muscle use.  Cardiovascular: Regular rate and rhythm, no murmurs / rubs / gallops.  No extremity edema. 2+ pedal pulses. No carotid bruits.  Abdomen: no tenderness, no masses palpated. No hepatosplenomegaly. Bowel sounds positive.  Musculoskeletal: no clubbing / cyanosis. No joint deformity upper and lower extremities. Good ROM, no contractures. Normal muscle tone.  Skin: no rashes, lesions, ulcers. No induration Neurologic: CN 2-12 grossly intact. Sensation intact, DTR normal. Strength 5/5 in all 4.  Psychiatric  Normal mood.    Labs on Admission: I have personally reviewed following labs and imaging studies  CBC: Recent Labs  Lab 01/22/19 1212  WBC 4.2  NEUTROABS 2.8  HGB 11.9*  HCT 35.6*  MCV 89.2  PLT 119*    Basic Metabolic Panel: Recent Labs  Lab 01/22/19 1212  NA 139  K 3.7  CL 106  CO2 21*  GLUCOSE 71  BUN 19  CREATININE 1.09  CALCIUM 8.4*   GFR: Estimated Creatinine Clearance: 48.9 mL/min (by C-G formula based on SCr of 1.09 mg/dL). Liver Function Tests: Recent Labs  Lab 01/22/19 1212  AST 24  ALT 13  ALKPHOS 59  BILITOT 0.8  PROT 6.3*  ALBUMIN 3.5   No results for input(s): LIPASE, AMYLASE in the last 168 hours. No results for input(s): AMMONIA in the last 168 hours. Coagulation Profile: No results for input(s): INR, PROTIME in the last 168 hours. Cardiac Enzymes: No results for input(s): CKTOTAL, CKMB, CKMBINDEX, TROPONINI in the last 168 hours. BNP (last 3 results) No results for input(s): PROBNP in the last 8760 hours. HbA1C: No results for input(s): HGBA1C in the last 72 hours. CBG: No results for input(s): GLUCAP in the last 168 hours. Lipid Profile: Recent Labs    01/22/19 1212  TRIG 180*   Thyroid Function Tests: No results for input(s): TSH, T4TOTAL, FREET4, T3FREE, THYROIDAB in the last 72 hours. Anemia Panel: Recent Labs    01/22/19 1212  FERRITIN 397*   Urine analysis:    Component Value Date/Time   COLORURINE YELLOW (A) 01/13/2019 1035   APPEARANCEUR CLEAR (A) 01/13/2019 1035   LABSPEC 1.013 01/13/2019 1035   PHURINE 6.0 01/13/2019 1035   GLUCOSEU NEGATIVE 01/13/2019 1035   HGBUR NEGATIVE 01/13/2019 1035   [place] 01/13/2019 1035   [place] 01/13/2019 1035   PROTEINUR NEGATIVE 01/13/2019 1035   NITRITE NEGATIVE 01/13/2019 1035   LEUKOCYTESUR NEGATIVE 01/13/2019 1035   Sepsis Labs: !!!!!!!!!!!!!!!!!!!!!!!!!!!!!!!!!!!!!!!!!!!! @LABRCNTIP (procalcitonin:4,lacticidven:4) )No results found for this or any previous visit (from the past 240 hour(s)).   Radiological Exams on Admission: Dg Chest Port 1 View  Result Date: 01/22/2019 CLINICAL DATA:  Covid positive. EXAM: PORTABLE CHEST 1 VIEW COMPARISON:  01/13/2019  FINDINGS: 1237 hours. Low lung volumes. Hazy airspace opacity at the right base is new in the interval. No pleural effusion. The cardiopericardial silhouette is within normal limits for size. Left-sided permanent pacemaker noted. Telemetry leads overlie the chest. IMPRESSION: New hazy airspace opacity at the right base compatible with pneumonia. Electronically Signed   By: Misty [place] M.D.   On: 01/22/2019 13:52   Old chart reviewed  Assessment/Plan 85 yo male with pna from covid infection  Principal Problem:   Pneumonia due to COVID-19 virus- decadron 6mg  iv daily.  Vit c/zinc and lovenox.  procal normal.  o2 nml with rt infiltrate beginning.  If resp status gets worse consider remdisivir.  Active Problems:   Encephalopathy due to COVID-19 virus- treatment as above    Dementia without behavioral disturbance ([place])- noted, stable    DM (diabetes mellitus) ([place])- ssi    HTN (hypertension)- clarify  and address home meds    DVT prophylaxis:  lovenox Code Status:  full Family Communication:  none Disposition Plan:  days Consults called:  none Admission status:  admission   Lakhia Gengler A MD [facility]  If 7PM-7AM, please contact night-coverage [email or web address] Password [email or web address]  01/22/2019, 9:19 PM

## 2019-01-22 NOTE — ED Notes (Signed)
Report given to Production manager at [facility].

## 2019-01-23 DIAGNOSIS — I10 Essential (primary) hypertension: Secondary | ICD-10-CM

## 2019-01-23 DIAGNOSIS — F0390 Unspecified dementia without behavioral disturbance: Secondary | ICD-10-CM

## 2019-01-23 DIAGNOSIS — G9349 Other encephalopathy: Secondary | ICD-10-CM

## 2019-01-23 DIAGNOSIS — E1169 Type 2 diabetes mellitus with other specified complication: Secondary | ICD-10-CM

## 2019-01-23 LAB — CBC WITH DIFFERENTIAL/PLATELET
Abs Immature Granulocytes: 0.02 10*3/uL (ref 0.00–0.07)
Basophils Absolute: 0 10*3/uL (ref 0.0–0.1)
Basophils Relative: 0 %
Eosinophils Absolute: 0 10*3/uL (ref 0.0–0.5)
Eosinophils Relative: 0 %
HCT: 33.2 % — ABNORMAL LOW (ref 39.0–52.0)
Hemoglobin: 11.1 g/dL — ABNORMAL LOW (ref 13.0–17.0)
Immature Granulocytes: 0 %
Lymphocytes Relative: 11 %
Lymphs Abs: 0.5 10*3/uL — ABNORMAL LOW (ref 0.7–4.0)
MCH: 30.3 pg (ref 26.0–34.0)
MCHC: 33.4 g/dL (ref 30.0–36.0)
MCV: 90.7 fL (ref 80.0–100.0)
Monocytes Absolute: 0.1 10*3/uL (ref 0.1–1.0)
Monocytes Relative: 2 %
Neutro Abs: 4.3 10*3/uL (ref 1.7–7.7)
Neutrophils Relative %: 87 %
Platelets: 123 10*3/uL — ABNORMAL LOW (ref 150–400)
RBC: 3.66 MIL/uL — ABNORMAL LOW (ref 4.22–5.81)
RDW: 13.7 % (ref 11.5–15.5)
WBC: 4.9 10*3/uL (ref 4.0–10.5)
nRBC: 0 % (ref 0.0–0.2)

## 2019-01-23 LAB — COMPREHENSIVE METABOLIC PANEL
ALT: 16 U/L (ref 0–44)
AST: 29 U/L (ref 15–41)
Albumin: 3.3 g/dL — ABNORMAL LOW (ref 3.5–5.0)
Alkaline Phosphatase: 58 U/L (ref 38–126)
Anion gap: 15 (ref 5–15)
BUN: 19 mg/dL (ref 8–23)
CO2: 19 mmol/L — ABNORMAL LOW (ref 22–32)
Calcium: 8.1 mg/dL — ABNORMAL LOW (ref 8.9–10.3)
Chloride: 103 mmol/L (ref 98–111)
Creatinine, Ser: 1.04 mg/dL (ref 0.61–1.24)
GFR calc Af Amer: >60 mL/min (ref >60)
GFR calc non Af Amer: >60 mL/min (ref >60)
Glucose, Bld: 146 mg/dL — ABNORMAL HIGH (ref 70–99)
Potassium: 3.9 mmol/L (ref 3.5–5.1)
Sodium: 137 mmol/L (ref 135–145)
Total Bilirubin: 0.7 mg/dL (ref 0.3–1.2)
Total Protein: 6.1 g/dL — ABNORMAL LOW (ref 6.5–8.1)

## 2019-01-23 LAB — C-REACTIVE PROTEIN: CRP: 5.3 mg/dL — ABNORMAL HIGH (ref <1.0)

## 2019-01-23 LAB — HEMOGLOBIN A1C
Hgb A1c MFr Bld: 6.7 % — ABNORMAL HIGH (ref 4.8–5.6)
Mean Plasma Glucose: 145.59 mg/dL

## 2019-01-23 LAB — GLUCOSE, CAPILLARY
Glucose-Capillary: 280 mg/dL — ABNORMAL HIGH (ref 70–99)
Glucose-Capillary: 215 mg/dL — ABNORMAL HIGH (ref 70–99)
Glucose-Capillary: 195 mg/dL — ABNORMAL HIGH (ref 70–99)
Glucose-Capillary: 157 mg/dL — ABNORMAL HIGH (ref 70–99)

## 2019-01-23 LAB — D-DIMER, QUANTITATIVE: D-Dimer, Quant: 0.53 ug/mL-FEU — ABNORMAL HIGH (ref 0.00–0.50)

## 2019-01-23 LAB — ABO/RH: ABO/RH(D): O POS

## 2019-01-23 MED ORDER — ASPIRIN 81 MG PO CHEW
81.0000 mg | CHEWABLE_TABLET | Freq: Every day | ORAL | Status: DC
  Administered 2019-01-23 – 2019-02-03 (×12): 81 mg via ORAL
  Filled 2019-01-23 (×13): qty 1

## 2019-01-23 MED ORDER — OMEGA-3-ACID ETHYL ESTERS 1 G PO CAPS
2.0000 g | ORAL_CAPSULE | Freq: Every day | ORAL | Status: DC
  Administered 2019-01-23 – 2019-02-03 (×12): 2 g via ORAL
  Filled 2019-01-23 (×16): qty 2

## 2019-01-23 MED ORDER — PROSIGHT PO TABS
1.0000 | ORAL_TABLET | Freq: Every day | ORAL | Status: DC
  Administered 2019-01-23 – 2019-02-04 (×13): 1 via ORAL
  Filled 2019-01-23 (×15): qty 1

## 2019-01-23 MED ORDER — TAMSULOSIN HCL 0.4 MG PO CAPS
0.8000 mg | ORAL_CAPSULE | Freq: Every day | ORAL | Status: DC
  Administered 2019-01-23 – 2019-02-04 (×13): 0.8 mg via ORAL
  Filled 2019-01-23 (×13): qty 2

## 2019-01-23 MED ORDER — DONEPEZIL HCL 10 MG PO TABS
20.0000 mg | ORAL_TABLET | Freq: Every day | ORAL | Status: DC
  Administered 2019-01-23 – 2019-02-04 (×13): 20 mg via ORAL
  Filled 2019-01-23 (×12): qty 2

## 2019-01-23 MED ORDER — AMLODIPINE BESYLATE 10 MG PO TABS
10.0000 mg | ORAL_TABLET | ORAL | Status: DC
  Administered 2019-01-26 – 2019-02-02 (×3): 10 mg via ORAL
  Filled 2019-01-23: qty 1
  Filled 2019-01-23 (×3): qty 2

## 2019-01-23 MED ORDER — CLOPIDOGREL BISULFATE 75 MG PO TABS
75.0000 mg | ORAL_TABLET | Freq: Every day | ORAL | Status: DC
  Administered 2019-01-23 – 2019-02-05 (×14): 75 mg via ORAL
  Filled 2019-01-23 (×14): qty 1

## 2019-01-23 MED ORDER — CALCIUM CARBONATE-VITAMIN D 500-200 MG-UNIT PO TABS
1.0000 | ORAL_TABLET | Freq: Every day | ORAL | Status: DC
  Administered 2019-01-23 – 2019-02-03 (×12): 1 via ORAL
  Filled 2019-01-23 (×16): qty 1

## 2019-01-23 NOTE — Progress Notes (Signed)
I spoke over the phone with the patient's brother about patient's condition, plan of care and all questions were addressed.

## 2019-01-23 NOTE — TOC Initial Note (Signed)
Transition of Care [facility]) - Initial/Assessment Note    Patient Details  Name: Jesus [name] MRN: [id number] Date of Birth: Aug 10, 1934  Transition of [facility]) CM/SW Contact:    Ninfa Meeker, RN Phone Number:  [phone] (working remotely) 01/23/2019, 10:11 AM  Clinical Narrative:   84 yr old male admitted from home with COVID 19. Patient has dementia, has a legal guardian: Clinton Ngu- [phone] (H) [phone] (C). Case manager will follow for appropriate needs as patient medically improves. May he be blessed to do so.               Patient Goals and CMS Choice        Expected Discharge Plan and Services: to be determined                                                Prior Living Arrangements/Services                       Activities of Daily Living Home Assistive Devices/Equipment: None ADL Screening (condition at time of admission) Patient's cognitive ability adequate to safely complete daily activities?: No Is the patient deaf or have difficulty hearing?: No Does the patient have difficulty seeing, even when wearing glasses/contacts?: No Does the patient have difficulty concentrating, remembering, or making decisions?: Yes Patient able to express need for assistance with ADLs?: No Does the patient have difficulty dressing or bathing?: Yes Independently performs ADLs?: No Communication: Needs assistance Is this a change from baseline?: Pre-admission baseline Dressing (OT): Needs assistance, Dependent Is this a change from baseline?: Change from baseline, expected to last <3days Grooming: Needs assistance, Dependent Is this a change from baseline?: Change from baseline, expected to last <3 days Feeding: Needs assistance, Dependent Is this a change from baseline?: Change from baseline, expected to last <3 days Bathing: Needs assistance, Dependent Is this a change from baseline?: Change from baseline, expected to last <3  days Toileting: Needs assistance, Dependent Is this a change from baseline?: Change from baseline, expected to last <3 days In/Out Bed: Needs assistance, Dependent Is this a change from baseline?: Change from baseline, expected to last <3 days Walks in Home: Needs assistance Is this a change from baseline?: Change from baseline, expected to last <3 days Does the patient have difficulty walking or climbing stairs?: Yes Weakness of Legs: None Weakness of Arms/Hands: None  Permission Sought/Granted                  Emotional Assessment              Admission diagnosis:  COVID 19 VIRUS INFECTION Patient Active Problem List   Diagnosis Date Noted  . Viral pneumonia 01/22/2019  . Pneumonia due to COVID-19 virus 01/22/2019  . Dementia without behavioral disturbance ([place]) 01/22/2019  . Encephalopathy due to COVID-19 virus 01/22/2019  . DM (diabetes mellitus) ([place]) 01/22/2019  . HTN (hypertension) 01/22/2019  . NSTEMI (non-ST elevated myocardial infarction) ([place]) 10/22/2017   PCP:  Tracie Harrier, MD Pharmacy:   [place], [place] AT Portal to Registered [place] [address] Phone: [phone] Fax: [place] ([place]), [place] - [place] ([place]) [place] [address] Phone: [phone] Fax: [phone]  Social Determinants of Health (SDOH) Interventions    Readmission Risk Interventions No flowsheet data found.

## 2019-01-23 NOTE — Progress Notes (Signed)
PROGRESS NOTE    Jesus [name]  KDX:[id number] DOB: [birth year] DOA: 01/22/2019 PCP: Leotis Shames, MD    Brief Narrative:  84 year old male who presented with dyspnea and altered mental status.  He does have significant past medical history for type 2 diabetes mellitus, hypertension and dementia.  Not feeling well for about 9 days, with decrease in cognitive function generalized weakness, emergency department visit on September 28, eventually discharge home from the ED, October 5 tested positive for SARS COVID-19.  At home he continued to have rapid decline in his cognitive function.  On his initial physical examination blood pressure was 150/65, pulse rate 67, respiratory 12, temperature 98.5, oxygen saturation 96% on room air.  His lungs are clear to auscultation bilaterally, heart S1-S2 present with me, abdomen soft, no lower extremity edema. Sodium 139, potassium 3.7, chloride 106, bicarb 21, glucose 71, BUN 19, creatinine 1.0, AST 24, ALT 13, ferritin 397, CRP 3.8, procalcitonin less than 0.10, white count 4.2, hemoglobin 9.9, hematocrit 35.6, platelet 119.  His chest radiograph had a faint interstitial infiltrate in the right base.  Mild cardiomegaly.  His electrocardiogram had 63 bpm, intermittent atrial pacing, ventricular pacing.  Patient was admitted to the hospital working diagnosis of SARS COVID-19 viral pneumonia complicated by metabolic encephalopathy.   Assessment & Plan:   Principal Problem:   Pneumonia due to COVID-19 virus Active Problems:   Dementia without behavioral disturbance (HCC)   Encephalopathy due to COVID-19 virus   DM (diabetes mellitus) (HCC)   HTN (hypertension)   1. SARS COVID 19 viral pneumonia, complicated with metabolic encephalopathy. Patient with improved symptoms, still confused and disorientated. His oxymetry is 97 on room air.   Ferritin 397 CRP 3,8 <5,3 D dimer 0.53.   Will continue medical therapy with Remdesivir and systemic corticosteroids  with dexamethasone. Continue oxymetry monitoring, vitamin C and zinc. Will plan to 5 day course of Remdesivir.  2. Dementia. Patient is confused and disorientated but not agitated, will continue neuro checks per unit protocol, aspiration precautions. Patient is at risk of criticall illness delirium.Continue with donepezil.   3. T2DM. Will continue glucose cover and monitoring with insulin sliding scale, patient is tolerating po well.   4. HTN. Continue blood pressure control with amlodipine.   DVT prophylaxis: enoxaparin   Code Status: full Family Communication: no family at the bedside  Disposition Plan/ discharge barriers: pending clinical improvement.   Body mass index is 24.55 kg/m. Malnutrition Type:      Malnutrition Characteristics:      Nutrition Interventions:     RN Pressure Injury Documentation:     Consultants:     Procedures:     Antimicrobials:   remdesivir      Subjective: Patient continue to be very weak and deconditioned, no worsening dyspnea, no chest pain. No nausea or vomiting.   Objective: Vitals:   01/22/19 2048 01/23/19 0337 01/23/19 0738  BP: (!) 150/65 (!) 153/59 (!) 144/66  Pulse: 67 69 61  Resp: 12 16 20   Temp: 98.5 F (36.9 C) 99.1 F (37.3 C) 98.1 F (36.7 C)  TempSrc: Oral Axillary Axillary  SpO2: 96% 95% 97%  Weight: 71.1 kg    Height: 5\' 7"  (1.702 m)      Intake/Output Summary (Last 24 hours) at 01/23/2019 0819 Last data filed at 01/23/2019 0600 Gross per 24 hour  Intake -  Output 150 ml  Net -150 ml   Filed Weights   01/22/19 2048  Weight: 71.1 kg    Examination:  General: Not in pain or dyspnea, deconditioned  Neurology: Awake and alert, non focal. He is disorientated.  E ENT: mild pallor, no icterus, oral mucosa moist Cardiovascular: No JVD. S1-S2 present, rhythmic, no gallops, rubs, or murmurs. No lower extremity edema. Pulmonary: positive breath sounds bilaterally. Gastrointestinal. Abdomen with  no organomegaly, non tender, no rebound or guarding Skin. No rashes Musculoskeletal: no joint deformities     Data Reviewed: I have personally reviewed following labs and imaging studies  CBC: Recent Labs  Lab 01/22/19 1212 01/23/19 0105  WBC 4.2 4.9  NEUTROABS 2.8 4.3  HGB 11.9* 11.1*  HCT 35.6* 33.2*  MCV 89.2 90.7  PLT 119* 106*   Basic Metabolic Panel: Recent Labs  Lab 01/22/19 1212 01/23/19 0105  NA 139 137  K 3.7 3.9  CL 106 103  CO2 21* 19*  GLUCOSE 71 146*  BUN 19 19  CREATININE 1.09 1.04  CALCIUM 8.4* 8.1*   GFR: Estimated Creatinine Clearance: 51.2 mL/min (by C-G formula based on SCr of 1.04 mg/dL). Liver Function Tests: Recent Labs  Lab 01/22/19 1212 01/23/19 0105  AST 24 29  ALT 13 16  ALKPHOS 59 58  BILITOT 0.8 0.7  PROT 6.3* 6.1*  ALBUMIN 3.5 3.3*   No results for input(s): LIPASE, AMYLASE in the last 168 hours. No results for input(s): AMMONIA in the last 168 hours. Coagulation Profile: No results for input(s): INR, PROTIME in the last 168 hours. Cardiac Enzymes: No results for input(s): CKTOTAL, CKMB, CKMBINDEX, TROPONINI in the last 168 hours. BNP (last 3 results) No results for input(s): PROBNP in the last 8760 hours. HbA1C: No results for input(s): HGBA1C in the last 72 hours. CBG: No results for input(s): GLUCAP in the last 168 hours. Lipid Profile: Recent Labs    01/22/19 1212  TRIG 180*   Thyroid Function Tests: No results for input(s): TSH, T4TOTAL, FREET4, T3FREE, THYROIDAB in the last 72 hours. Anemia Panel: Recent Labs    01/22/19 1212  FERRITIN 397*      Radiology Studies: I have reviewed all of the imaging during this hospital visit personally     Scheduled Meds: . dexamethasone (DECADRON) injection  6 mg Intravenous Q24H  . enoxaparin (LOVENOX) injection  40 mg Subcutaneous Q24H  . insulin aspart  0-9 Units Subcutaneous TID WC  . sodium chloride flush  3 mL Intravenous Q12H  . vitamin C  500 mg Oral  Daily  . zinc sulfate  220 mg Oral Daily   Continuous Infusions: . sodium chloride       LOS: 1 day        Mauricio Gerome Apley, MD

## 2019-01-24 LAB — CBC WITH DIFFERENTIAL/PLATELET
Abs Immature Granulocytes: 0.05 10*3/uL (ref 0.00–0.07)
Basophils Absolute: 0 10*3/uL (ref 0.0–0.1)
Basophils Relative: 0 %
Eosinophils Absolute: 0 10*3/uL (ref 0.0–0.5)
Eosinophils Relative: 0 %
HCT: 35.3 % — ABNORMAL LOW (ref 39.0–52.0)
Hemoglobin: 11.9 g/dL — ABNORMAL LOW (ref 13.0–17.0)
Immature Granulocytes: 1 %
Lymphocytes Relative: 9 %
Lymphs Abs: 0.6 10*3/uL — ABNORMAL LOW (ref 0.7–4.0)
MCH: 30.3 pg (ref 26.0–34.0)
MCHC: 33.7 g/dL (ref 30.0–36.0)
MCV: 89.8 fL (ref 80.0–100.0)
Monocytes Absolute: 0.3 10*3/uL (ref 0.1–1.0)
Monocytes Relative: 4 %
Neutro Abs: 5.7 10*3/uL (ref 1.7–7.7)
Neutrophils Relative %: 86 %
Platelets: 143 10*3/uL — ABNORMAL LOW (ref 150–400)
RBC: 3.93 MIL/uL — ABNORMAL LOW (ref 4.22–5.81)
RDW: 13.5 % (ref 11.5–15.5)
WBC: 6.6 10*3/uL (ref 4.0–10.5)
nRBC: 0 % (ref 0.0–0.2)

## 2019-01-24 LAB — COMPREHENSIVE METABOLIC PANEL
ALT: 22 U/L (ref 0–44)
AST: 31 U/L (ref 15–41)
Albumin: 3.4 g/dL — ABNORMAL LOW (ref 3.5–5.0)
Alkaline Phosphatase: 63 U/L (ref 38–126)
Anion gap: 15 (ref 5–15)
BUN: 32 mg/dL — ABNORMAL HIGH (ref 8–23)
CO2: 21 mmol/L — ABNORMAL LOW (ref 22–32)
Calcium: 8.4 mg/dL — ABNORMAL LOW (ref 8.9–10.3)
Chloride: 101 mmol/L (ref 98–111)
Creatinine, Ser: 1.2 mg/dL (ref 0.61–1.24)
GFR calc Af Amer: >60 mL/min (ref >60)
GFR calc non Af Amer: 56 mL/min — ABNORMAL LOW (ref >60)
Glucose, Bld: 187 mg/dL — ABNORMAL HIGH (ref 70–99)
Potassium: 3.7 mmol/L (ref 3.5–5.1)
Sodium: 137 mmol/L (ref 135–145)
Total Bilirubin: 0.9 mg/dL (ref 0.3–1.2)
Total Protein: 6.4 g/dL — ABNORMAL LOW (ref 6.5–8.1)

## 2019-01-24 LAB — GLUCOSE, CAPILLARY
Glucose-Capillary: 259 mg/dL — ABNORMAL HIGH (ref 70–99)
Glucose-Capillary: 364 mg/dL — ABNORMAL HIGH (ref 70–99)
Glucose-Capillary: 367 mg/dL — ABNORMAL HIGH (ref 70–99)
Glucose-Capillary: 240 mg/dL — ABNORMAL HIGH (ref 70–99)

## 2019-01-24 LAB — D-DIMER, QUANTITATIVE: D-Dimer, Quant: 0.65 ug/mL-FEU — ABNORMAL HIGH (ref 0.00–0.50)

## 2019-01-24 LAB — FERRITIN: Ferritin: 677 ng/mL — ABNORMAL HIGH (ref 24–336)

## 2019-01-24 LAB — C-REACTIVE PROTEIN: CRP: 4.5 mg/dL — ABNORMAL HIGH (ref <1.0)

## 2019-01-24 MED ORDER — INSULIN ASPART 100 UNIT/ML ~~LOC~~ SOLN
0.0000 [IU] | Freq: Three times a day (TID) | SUBCUTANEOUS | Status: DC
  Administered 2019-01-24 – 2019-01-25 (×2): 15 [IU] via SUBCUTANEOUS
  Administered 2019-01-25: 5 [IU] via SUBCUTANEOUS
  Administered 2019-01-26: 8 [IU] via SUBCUTANEOUS
  Administered 2019-01-26: 15 [IU] via SUBCUTANEOUS
  Administered 2019-01-26: 11 [IU] via SUBCUTANEOUS
  Administered 2019-01-28 – 2019-01-29 (×3): 5 [IU] via SUBCUTANEOUS
  Administered 2019-01-30: 15 [IU] via SUBCUTANEOUS
  Administered 2019-01-30: 8 [IU] via SUBCUTANEOUS
  Administered 2019-01-30: 5 [IU] via SUBCUTANEOUS
  Administered 2019-01-31: 11 [IU] via SUBCUTANEOUS
  Administered 2019-01-31 (×2): 8 [IU] via SUBCUTANEOUS
  Administered 2019-02-01: 11 [IU] via SUBCUTANEOUS
  Administered 2019-02-02: 8 [IU] via SUBCUTANEOUS
  Administered 2019-02-02: 11 [IU] via SUBCUTANEOUS
  Administered 2019-02-03: 5 [IU] via SUBCUTANEOUS
  Administered 2019-02-03: 15 [IU] via SUBCUTANEOUS
  Administered 2019-02-03: 11 [IU] via SUBCUTANEOUS
  Administered 2019-02-04 – 2019-02-05 (×3): 5 [IU] via SUBCUTANEOUS

## 2019-01-24 MED ORDER — LACTATED RINGERS IV SOLN
INTRAVENOUS | Status: DC
  Administered 2019-01-24 – 2019-01-26 (×3): via INTRAVENOUS

## 2019-01-24 NOTE — Evaluation (Signed)
Physical Therapy Evaluation Patient Details Name: Jesus [name] MRN: [id number] DOB: 06-16-1934 Today's Date: 01/24/2019   History of Present Illness  84 y/o male w/ hx of dementia, DM, HTN, brought to hospital by family after +COVID test and increasing weakness.  Clinical Impression   Pt admitted with above diagnosis. Pt currently with functional limitations due to the deficits listed below (see PT Problem List). He is extremely HOH and hence difficulty with cues etc. He is able to get from bed to recliner with min-mod a x 1 and HHA. He remains on room air and sats tend to remain in 90s. Pt will benefit from skilled PT to increase their independence and safety with mobility to allow discharge to the venue listed below.       Follow Up Recommendations Home health PT    Equipment Recommendations       Recommendations for Other Services       Precautions / Restrictions Precautions Precautions: Fall Precaution Comments: HOH Restrictions Weight Bearing Restrictions: No      Mobility  Bed Mobility Overal bed mobility: Needs Assistance Bed Mobility: Supine to Sit     Supine to sit: Min assist        Transfers Overall transfer level: Needs assistance Equipment used: 1 person hand held assist Transfers: Sit to/from Stand Sit to Stand: Mod assist         General transfer comment: sit<>stand from semi elevated bed  Ambulation/Gait Ambulation/Gait assistance: Mod assist Gait Distance (Feet): 5 Feet Assistive device: 1 person hand held assist Gait Pattern/deviations: Shuffle Gait velocity: slow and shuffled      Stairs            Wheelchair Mobility    Modified Rankin (Stroke Patients Only)       Balance Overall balance assessment: Needs assistance Sitting-balance support: Feet supported Sitting balance-Leahy Scale: Fair   Postural control: (no leaning noted, sits EOB unsupported) Standing balance support: Single extremity supported Standing  balance-Leahy Scale: Fair Standing balance comment: needs HHA for safety                             Pertinent Vitals/Pain Pain Assessment: No/denies pain    Home Living Family/patient expects to be discharged to:: Private residence Living Arrangements: Other relatives Available Help at Discharge: Family             Additional Comments: Pt unable to give any details on life prior to hospitalization, he states he either does not  know or he does not rememember    Prior Function           Comments: Pt is poor historian and unable to give hx answers, he is quite unsteady on his feet indicating he may have been using AD for mobility.      Hand Dominance        Extremity/Trunk Assessment        Lower Extremity Assessment Lower Extremity Assessment: Generalized weakness    Cervical / Trunk Assessment Cervical / Trunk Assessment: Normal  Communication   Communication: HOH  Cognition Arousal/Alertness: Awake/alert Behavior During Therapy: Flat affect Overall Cognitive Status: No family/caregiver present to determine baseline cognitive functioning                                 General Comments: Oriented to self only; pleasant and cooperative, follows simple commands consistenly  General Comments General comments (skin integrity, edema, etc.): Pt extremely HOH, when asked any hx questions states he doesnt know or doesn't remember. from balance/ coordination, activity tolerance testing seems he did need some assist at home, and possibly used an AD to get around. He is able to take some small shuffled steps from bed to reliner with HHA and vcs. once seated in recliner noted 02 sats in low 80s, nurse called and changed probe from earlobe to finger probe and sats read 97% on room air.    Exercises     Assessment/Plan    PT Assessment Patient needs continued PT services(while in hospital)  PT Problem List Decreased strength;Decreased  activity tolerance;Decreased balance;Decreased coordination;Decreased safety awareness       PT Treatment Interventions      PT Goals (Current goals can be found in the Care Plan section)  Acute Rehab PT Goals Time For Goal Achievement: 02/07/19 Potential to Achieve Goals: Fair    Frequency Min 2X/week   Barriers to discharge        Co-evaluation               AM-PAC PT "6 Clicks" Mobility  Outcome Measure Help needed turning from your back to your side while in a flat bed without using bedrails?: A Little Help needed moving from lying on your back to sitting on the side of a flat bed without using bedrails?: A Little Help needed moving to and from a bed to a chair (including a wheelchair)?: A Lot Help needed standing up from a chair using your arms (e.g., wheelchair or bedside chair)?: A Lot Help needed to walk in hospital room?: A Lot Help needed climbing 3-5 steps with a railing? : Total 6 Click Score: 13    End of Session   Activity Tolerance: Patient tolerated treatment well Patient left: in chair;with call bell/phone within reach Nurse Communication: Mobility status;Other (comment)(02 sats) PT Visit Diagnosis: Muscle weakness (generalized) (M62.81);Other abnormalities of gait and mobility (R26.89)    Time: 1610-9604 PT Time Calculation (min) (ACUTE ONLY): 31 min   Charges:   PT Evaluation $PT Eval Moderate Complexity: 1 Mod PT Treatments $Therapeutic Activity: 8-22 mins        Horald Chestnut, PT   Delford Field 01/24/2019, 2:18 PM

## 2019-01-24 NOTE — Progress Notes (Signed)
PROGRESS NOTE    Jesus [name]  SNK:[id number] DOB: 28-Nov-1934 DOA: 01/22/2019 PCP: Tracie Harrier, MD    Brief Narrative:  84 year old male who presented with dyspnea and altered mental status.  He does have significant past medical history for type 2 diabetes mellitus, hypertension and dementia.  Not feeling well for about 9 days, with decrease in cognitive function generalized weakness, emergency department visit on September 28, eventually discharge home from the ED, October 5 tested positive for SARS COVID-19.  At home he continued to have rapid decline in his cognitive function.  On his initial physical examination blood pressure was 150/65, pulse rate 67, respiratory 12, temperature 98.5, oxygen saturation 96% on room air.  His lungs are clear to auscultation bilaterally, heart S1-S2 present with me, abdomen soft, no lower extremity edema. Sodium 139, potassium 3.7, chloride 106, bicarb 21, glucose 71, BUN 19, creatinine 1.0, AST 24, ALT 13, ferritin 397, CRP 3.8, procalcitonin less than 0.10, white count 4.2, hemoglobin 9.9, hematocrit 35.6, platelet 119.  His chest radiograph had a faint interstitial infiltrate in the right base.  Mild cardiomegaly.  His electrocardiogram had 63 bpm, intermittent atrial pacing, ventricular pacing.  Patient was admitted to the hospital working diagnosis of SARS COVID-19 viral pneumonia complicated by metabolic encephalopathy.  Patient remains confused but not agitated, denies dyspnea or cough. Plan to complete 5 days of Remdesivir in the hospital. Plan to return home with hid brother.   Oxymetry has been 95 to 100% on room air.   Assessment & Plan:   Principal Problem:   Pneumonia due to COVID-19 virus Active Problems:   Dementia without behavioral disturbance ([place])   Encephalopathy due to COVID-19 virus   DM (diabetes mellitus) ([place])   HTN (hypertension)   1. SARS COVID 19 viral pneumonia, complicated with metabolic encephalopathy. Continue  with confusion but not agitation, He has remained on room air, today with pulse oxymetry on 100%.  Ferritin 397<677 CRP 3,8 <5,3>4,5 D dimer 0.53.< 0.65   Tolerating well Remdesivir (AST 31 and ALT 22). Continue with systemic corticosteroids, IV dexamethasone. On vitamin C and zinc. Pending physical therapy evaluation.   2. Dementia. Continue to be confused and disorientated. Continue with donepezil.   3. T2DM. Fasting glucose is 187 this am, will continue insulin sliding scale, increase sensitivity to moderate, continue to hold on basal insulin for now.   4. HTN. Blood pressure has remained stable, continue to hold on antihypertensive medications.   5. Anion gap metabolic acidosis. Noted increase in serum cr from 1,0 to 1,2, not beyond 0.3 in 24 H increased, no criteria for AKI. Will add gentle IV fluids with LR for 24 H and will check on renal panel in am.   DVT prophylaxis: enoxaparin   Code Status: full Family Communication: no family at the bedside  Disposition Plan/ discharge barriers: pending clinical improvement   Body mass index is 24.55 kg/m. Malnutrition Type:      Malnutrition Characteristics:      Nutrition Interventions:     RN Pressure Injury Documentation:     Consultants:     Procedures:     Antimicrobials:   Remdesivir.     Subjective: Patient is confused but does not show signs of dyspnea or chest pain, no evident cough, no nausea or vomiting.   Objective: Vitals:   01/23/19 0738 01/23/19 1700 01/23/19 2038 01/24/19 0540  BP: (!) 144/66 117/67 (!) 153/62 (!) 146/59  Pulse: 61 64 69   Resp: 20  18 19  Temp: 98.1 F (36.7 C) (!) 97.4 F (36.3 C) 98.8 F (37.1 C) 98.5 F (36.9 C)  TempSrc: Axillary Oral Oral Oral  SpO2: 97%   100%  Weight:      Height:        Intake/Output Summary (Last 24 hours) at 01/24/2019 0848 Last data filed at 01/24/2019 8101 Gross per 24 hour  Intake 120 ml  Output 800 ml  Net -680 ml   Filed  Weights   01/22/19 2048  Weight: 71.1 kg    Examination:   General: Not in pain or dyspnea, deconditioned  Neurology: Awake and alert, non focal  E ENT: mild pallor, no icterus, oral mucosa moist Cardiovascular: No JVD. S1-S2 present, rhythmic, no gallops, rubs, or murmurs. No lower extremity edema. Pulmonary: positive breath sounds bilaterally. Gastrointestinal. Abdomen with no organomegaly, non tender, no rebound or guarding Skin. No rashes Musculoskeletal: no joint deformities     Data Reviewed: I have personally reviewed following labs and imaging studies  CBC: Recent Labs  Lab 01/22/19 1212 01/23/19 0105 01/24/19 0055  WBC 4.2 4.9 6.6  NEUTROABS 2.8 4.3 5.7  HGB 11.9* 11.1* 11.9*  HCT 35.6* 33.2* 35.3*  MCV 89.2 90.7 89.8  PLT 119* 123* 143*   Basic Metabolic Panel: Recent Labs  Lab 01/22/19 1212 01/23/19 0105 01/24/19 0055  NA 139 137 137  K 3.7 3.9 3.7  CL 106 103 101  CO2 21* 19* 21*  GLUCOSE 71 146* 187*  BUN 19 19 32*  CREATININE 1.09 1.04 1.20  CALCIUM 8.4* 8.1* 8.4*   GFR: Estimated Creatinine Clearance: 44.4 mL/min (by C-G formula based on SCr of 1.2 mg/dL). Liver Function Tests: Recent Labs  Lab 01/22/19 1212 01/23/19 0105 01/24/19 0055  AST 24 29 31   ALT 13 16 22   ALKPHOS 59 58 63  BILITOT 0.8 0.7 0.9  PROT 6.3* 6.1* 6.4*  ALBUMIN 3.5 3.3* 3.4*   No results for input(s): LIPASE, AMYLASE in the last 168 hours. No results for input(s): AMMONIA in the last 168 hours. Coagulation Profile: No results for input(s): INR, PROTIME in the last 168 hours. Cardiac Enzymes: No results for input(s): CKTOTAL, CKMB, CKMBINDEX, TROPONINI in the last 168 hours. BNP (last 3 results) No results for input(s): PROBNP in the last 8760 hours. HbA1C: Recent Labs    01/23/19 0105  HGBA1C 6.7*   CBG: Recent Labs  Lab 01/23/19 0735 01/23/19 1200 01/23/19 1614 01/23/19 2035 01/24/19 0811  GLUCAP 280* 215* 195* 157* 259*   Lipid Profile:  Recent Labs    01/22/19 1212  TRIG 180*   Thyroid Function Tests: No results for input(s): TSH, T4TOTAL, FREET4, T3FREE, THYROIDAB in the last 72 hours. Anemia Panel: Recent Labs    01/22/19 1212 01/24/19 0055  FERRITIN 397* 677*      Radiology Studies: I have reviewed all of the imaging during this hospital visit personally     Scheduled Meds: . [START ON 01/26/2019] amLODipine  10 mg Oral Once per day on Sun Wed  . aspirin  81 mg Oral Daily  . calcium-vitamin D  1 tablet Oral Daily  . clopidogrel  75 mg Oral Daily  . dexamethasone (DECADRON) injection  6 mg Intravenous Q24H  . donepezil  20 mg Oral QHS  . enoxaparin (LOVENOX) injection  40 mg Subcutaneous Q24H  . insulin aspart  0-9 Units Subcutaneous TID WC  . multivitamin  1 tablet Oral QHS  . omega-3 acid ethyl esters  2 g Oral Daily  .  sodium chloride flush  3 mL Intravenous Q12H  . tamsulosin  0.8 mg Oral QHS  . vitamin C  500 mg Oral Daily  . zinc sulfate  220 mg Oral Daily   Continuous Infusions: . sodium chloride       LOS: 2 days        Christianna Belmonte Annett Gulaaniel Marianny Goris, MD

## 2019-01-24 NOTE — Progress Notes (Signed)
Patients brother Tarri Fuller called and updated with patients condition for the day and all questions answered.

## 2019-01-24 NOTE — TOC Progression Note (Signed)
Transition of Care [facility]) - Progression Note    Patient Details  Name: Jesus [name] MRN: [id number] Date of Birth: Mar 10, 1935  [facility] CM/SW Contact  Ninfa Meeker, RN Phone Number: [phone] (working remotely) 01/24/2019, 3:04 PM  Clinical Narrative:   84 yr old male admitted and being treated for COVID 19. Patient has dementia and lives with his brother Jesus [name], who is his legal guardian.  Contact# [phone], [phone] (C). Case manager spoke with Jesus [name] concerning patient's needs ar discharge. His greatest concern is that patient is able to walk and get around as he was prior to being ill. Jesus [name] stated that patient had been receiving therapy a fewq weeks ago and wants to use the same people since patient is familiar. Case Manager checked notes and sent text message to Jesus [name] with [facility], her voice mailbox is full. Case manager will continue to monitor.      Expected Discharge Plan: [place] Barriers to Discharge: Continued Medical Work up  Expected Discharge Plan and Services Expected Discharge Plan: [place] Choice: Home Health                                 Date Secretary: 01/24/19 Time [place]: [phone] Representative spoke with at [place]: Jesus [name]   Social Determinants of Health (SDOH) Interventions    Readmission Risk Interventions No flowsheet data found.

## 2019-01-24 NOTE — Progress Notes (Signed)
Inpatient Diabetes Program Recommendations  AACE/ADA: New Consensus Statement on Inpatient Glycemic Control (2015)  Target Ranges:  Prepandial:   less than 140 mg/dL      [place] postprandial:   less than 180 mg/dL (1-2 hours)      Critically ill patients:  140 - 180 mg/dL   Lab Results  Component Value Date   GLUCAP 364 (H) 01/24/2019   HGBA1C 6.7 (H) 01/23/2019    Review of Glycemic Control Results for Jesus [name], Jesus [name] (MRN [id number]) as of 01/24/2019 13:18  Ref. Range 01/23/2019 12:00 01/23/2019 16:14 01/23/2019 20:35 01/24/2019 08:11 01/24/2019 12:34  Glucose-Capillary Latest Ref Range: 70 - 99 mg/dL 215 (H) 195 (H) 157 (H) 259 (H) 364 (H)   Diabetes history: DM2 Outpatient Diabetes medications: Glucotrol 20 mg bid + Actos 30 mg qd Current orders for Inpatient glycemic control: Novolog sensitive correction tid  Inpatient Diabetes Program Recommendations:   -Increase Novolog correction to moderate tid with meals  Thank you, Nani Gasser. Sophiah Rolin, RN, MSN, CDE  Diabetes Coordinator Inpatient Glycemic Control Team Team Pager [phone] (8am-5pm) 01/24/2019 1:21 PM

## 2019-01-24 NOTE — Progress Notes (Signed)
Spoke with brother Shelly Flatten, updated and questions answered.

## 2019-01-25 LAB — CBC WITH DIFFERENTIAL/PLATELET
Abs Immature Granulocytes: 0.04 10*3/uL (ref 0.00–0.07)
Basophils Absolute: 0 10*3/uL (ref 0.0–0.1)
Basophils Relative: 0 %
Eosinophils Absolute: 0 10*3/uL (ref 0.0–0.5)
Eosinophils Relative: 0 %
HCT: 31.8 % — ABNORMAL LOW (ref 39.0–52.0)
Hemoglobin: 10.8 g/dL — ABNORMAL LOW (ref 13.0–17.0)
Immature Granulocytes: 1 %
Lymphocytes Relative: 8 %
Lymphs Abs: 0.5 10*3/uL — ABNORMAL LOW (ref 0.7–4.0)
MCH: 30.7 pg (ref 26.0–34.0)
MCHC: 34 g/dL (ref 30.0–36.0)
MCV: 90.3 fL (ref 80.0–100.0)
Monocytes Absolute: 0.2 10*3/uL (ref 0.1–1.0)
Monocytes Relative: 4 %
Neutro Abs: 5.3 10*3/uL (ref 1.7–7.7)
Neutrophils Relative %: 87 %
Platelets: 149 10*3/uL — ABNORMAL LOW (ref 150–400)
RBC: 3.52 MIL/uL — ABNORMAL LOW (ref 4.22–5.81)
RDW: 13.5 % (ref 11.5–15.5)
WBC: 6.1 10*3/uL (ref 4.0–10.5)
nRBC: 0 % (ref 0.0–0.2)

## 2019-01-25 LAB — C-REACTIVE PROTEIN: CRP: 2.6 mg/dL — ABNORMAL HIGH (ref <1.0)

## 2019-01-25 LAB — GLUCOSE, CAPILLARY
Glucose-Capillary: 352 mg/dL — ABNORMAL HIGH (ref 70–99)
Glucose-Capillary: 413 mg/dL — ABNORMAL HIGH (ref 70–99)
Glucose-Capillary: 238 mg/dL — ABNORMAL HIGH (ref 70–99)
Glucose-Capillary: 202 mg/dL — ABNORMAL HIGH (ref 70–99)

## 2019-01-25 LAB — D-DIMER, QUANTITATIVE: D-Dimer, Quant: 0.45 ug/mL-FEU (ref 0.00–0.50)

## 2019-01-25 LAB — FERRITIN: Ferritin: 510 ng/mL — ABNORMAL HIGH (ref 24–336)

## 2019-01-25 LAB — COMPREHENSIVE METABOLIC PANEL
ALT: 22 U/L (ref 0–44)
AST: 24 U/L (ref 15–41)
Albumin: 3.3 g/dL — ABNORMAL LOW (ref 3.5–5.0)
Alkaline Phosphatase: 63 U/L (ref 38–126)
Anion gap: 11 (ref 5–15)
BUN: 49 mg/dL — ABNORMAL HIGH (ref 8–23)
CO2: 23 mmol/L (ref 22–32)
Calcium: 8.6 mg/dL — ABNORMAL LOW (ref 8.9–10.3)
Chloride: 103 mmol/L (ref 98–111)
Creatinine, Ser: 1.45 mg/dL — ABNORMAL HIGH (ref 0.61–1.24)
GFR calc Af Amer: 52 mL/min — ABNORMAL LOW (ref >60)
GFR calc non Af Amer: 45 mL/min — ABNORMAL LOW (ref >60)
Glucose, Bld: 283 mg/dL — ABNORMAL HIGH (ref 70–99)
Potassium: 4.7 mmol/L (ref 3.5–5.1)
Sodium: 137 mmol/L (ref 135–145)
Total Bilirubin: 0.8 mg/dL (ref 0.3–1.2)
Total Protein: 6.2 g/dL — ABNORMAL LOW (ref 6.5–8.1)

## 2019-01-25 MED ORDER — INSULIN GLARGINE 100 UNIT/ML ~~LOC~~ SOLN
10.0000 [IU] | Freq: Every day | SUBCUTANEOUS | Status: DC
  Administered 2019-01-25 – 2019-01-29 (×5): 10 [IU] via SUBCUTANEOUS
  Filled 2019-01-25 (×5): qty 0.1

## 2019-01-25 MED ORDER — INSULIN ASPART 100 UNIT/ML ~~LOC~~ SOLN
15.0000 [IU] | Freq: Once | SUBCUTANEOUS | Status: AC
  Administered 2019-01-25: 15 [IU] via SUBCUTANEOUS

## 2019-01-25 MED ORDER — SODIUM CHLORIDE 0.9 % IV SOLN
200.0000 mg | Freq: Once | INTRAVENOUS | Status: AC
  Administered 2019-01-25: 200 mg via INTRAVENOUS
  Filled 2019-01-25: qty 40

## 2019-01-25 MED ORDER — SODIUM CHLORIDE 0.9 % IV SOLN
100.0000 mg | INTRAVENOUS | Status: AC
  Administered 2019-01-26 – 2019-01-29 (×4): 100 mg via INTRAVENOUS
  Filled 2019-01-25 (×4): qty 20

## 2019-01-25 NOTE — Progress Notes (Addendum)
PROGRESS NOTE    Jesus [name]  ZOX:[id number] DOB: Mar 24, 1935 DOA: 01/22/2019 PCP: Barbette ReichmannHande, Vishwanath, MD    Brief Narrative:  84 year old male who presented with dyspnea and altered mental status. He does have significant past medical history for type 2 diabetes mellitus, hypertension and dementia. Not feeling well for about 9 days, with decrease in cognitive function generalized weakness, emergency department visit on September 28, eventually discharge home from the ED, October 5 tested positive for SARS COVID-19. At home he continued to have rapid decline in his cognitive function. On his initial physical examination blood pressure was 150/65, pulse rate 67, respiratory 12, temperature 98.5, oxygen saturation 96% on room air. His lungs are clear to auscultation bilaterally, heart S1-S2 present with me, abdomen soft, no lower extremity edema. Sodium 139, potassium 3.7, chloride 106, bicarb 21, glucose 71, BUN 19, creatinine 1.0, AST 24, ALT 13, ferritin 397, CRP 3.8, procalcitonin less than 0.10, white count 4.2, hemoglobin 9.9, hematocrit 35.6, platelet 119. His chest radiograph had a faint interstitial infiltrate in the right base. Mild cardiomegaly. His electrocardiogram had 63 bpm, intermittent atrial pacing, ventricular pacing.  Patient was admitted to the hospital working diagnosis of SARS COVID-19 viral pneumonia complicated by metabolic encephalopathy.  Patient remains confused but not agitated, denies dyspnea or cough. Plan to complete 5 days of Remdesivir in the hospital. Plan to return home with hid brother.   Oxymetry has been 95 to 100% on room air.    Assessment & Plan:   Principal Problem:   Pneumonia due to COVID-19 virus Active Problems:   Dementia without behavioral disturbance (HCC)   Encephalopathy due to COVID-19 virus   DM (diabetes mellitus) (HCC)   HTN (hypertension)   1. SARS COVID 19 viral pneumonia, complicated with metabolic encephalopathy. Patient  has been stable, his confusion likely at his baseline. His oxygenation has been 94 to 98 % on room air.   Ferritin 397<677>500 CRP 3,8 <5,3>4,5>2,6 D dimer 0.53.< 0.65>0,45  Antiviral therapy with Remdesivir (AST 31 and ALT 22). Continue with systemic corticosteroids, IV dexamethasone. Continue with vitamin C and zinc.    2. Dementia. Likely patient is back to baseline, will continue with donepezil.   3. T2DM with steroid induced hyperglycemia. His fasting glucose this am is up to 283, capillary glucose has been  367, 240, 352, 413. Will add basal insulin with 10 units of glargine and will  continue insulin sliding scale (moderate sensitivity), for glucose cover and monitoring. Patient is tolerating po well.   4. HTN. Blood pressure 142 to 156 mmHg systolic, continue blood pressure control with amlodipine.  5. AKI with anion gap metabolic acidosis. Acidosis has improved with serum bicarbonate at 23, K at 4,7 and Cl at 103, continue worsening renal function, with serum cr at 1,45. Documented urine output over last 24 H is 725 ml. Will increase IV fluids to 75 ml per H and will follow on renal panel in am.   DVT prophylaxis:enoxaparin Code Status:full Family Communication:no family at the bedside Disposition Plan/ discharge barriers:pending clinical improvement    Body mass index is 24.55 kg/m. Malnutrition Type:      Malnutrition Characteristics:      Nutrition Interventions:     RN Pressure Injury Documentation:     Consultants:     Procedures:     Antimicrobials:   Remdesivir.     Subjective: Patient is feeling better, no dyspnea or chest pain, no nausea or vomiting. Patient worked with physical therapy with recommendations for home  health at discharge.   Objective: Vitals:   01/24/19 1600 01/24/19 1955 01/25/19 0449 01/25/19 0723  BP: 121/62 122/84 (!) 142/63 (!) 156/72  Pulse: 61 74 60 62  Resp: 18 18 18 16   Temp: 98.2 F (36.8 C)  98.5 F (36.9 C) 98.3 F (36.8 C) 97.9 F (36.6 C)  TempSrc: Oral Oral Oral Oral  SpO2: 97% 98% 94% 98%  Weight:      Height:        Intake/Output Summary (Last 24 hours) at 01/25/2019 0740 Last data filed at 01/25/2019 0449 Gross per 24 hour  Intake 1029.94 ml  Output 725 ml  Net 304.94 ml   Filed Weights   01/22/19 2048  Weight: 71.1 kg    Examination:   General: Not in pain or dyspnea.  Neurology: Awake and alert, non focal  E ENT: no pallor, no icterus, oral mucosa moist Cardiovascular: No JVD. S1-S2 present, rhythmic, no gallops, rubs, or murmurs. No lower extremity edema. Pulmonary: positive breath sounds bilaterally.  Gastrointestinal. Abdomen with no organomegaly, non tender, no rebound or guarding Skin. No rashes Musculoskeletal: no joint deformities     Data Reviewed: I have personally reviewed following labs and imaging studies  CBC: Recent Labs  Lab 01/22/19 1212 01/23/19 0105 01/24/19 0055 01/25/19 0354  WBC 4.2 4.9 6.6 6.1  NEUTROABS 2.8 4.3 5.7 5.3  HGB 11.9* 11.1* 11.9* 10.8*  HCT 35.6* 33.2* 35.3* 31.8*  MCV 89.2 90.7 89.8 90.3  PLT 119* 123* 143* 474*   Basic Metabolic Panel: Recent Labs  Lab 01/22/19 1212 01/23/19 0105 01/24/19 0055 01/25/19 0354  NA 139 137 137 137  K 3.7 3.9 3.7 4.7  CL 106 103 101 103  CO2 21* 19* 21* 23  GLUCOSE 71 146* 187* 283*  BUN 19 19 32* 49*  CREATININE 1.09 1.04 1.20 1.45*  CALCIUM 8.4* 8.1* 8.4* 8.6*   GFR: Estimated Creatinine Clearance: 36.7 mL/min (A) (by C-G formula based on SCr of 1.45 mg/dL (H)). Liver Function Tests: Recent Labs  Lab 01/22/19 1212 01/23/19 0105 01/24/19 0055 01/25/19 0354  AST 24 29 31 24   ALT 13 16 22 22   ALKPHOS 59 58 63 63  BILITOT 0.8 0.7 0.9 0.8  PROT 6.3* 6.1* 6.4* 6.2*  ALBUMIN 3.5 3.3* 3.4* 3.3*   No results for input(s): LIPASE, AMYLASE in the last 168 hours. No results for input(s): AMMONIA in the last 168 hours. Coagulation Profile: No results for  input(s): INR, PROTIME in the last 168 hours. Cardiac Enzymes: No results for input(s): CKTOTAL, CKMB, CKMBINDEX, TROPONINI in the last 168 hours. BNP (last 3 results) No results for input(s): PROBNP in the last 8760 hours. HbA1C: Recent Labs    01/23/19 0105  HGBA1C 6.7*   CBG: Recent Labs  Lab 01/24/19 0811 01/24/19 1234 01/24/19 1717 01/24/19 2136 01/25/19 0721  GLUCAP 259* 364* 367* 240* 352*   Lipid Profile: Recent Labs    01/22/19 1212  TRIG 180*   Thyroid Function Tests: No results for input(s): TSH, T4TOTAL, FREET4, T3FREE, THYROIDAB in the last 72 hours. Anemia Panel: Recent Labs    01/24/19 0055 01/25/19 0354  FERRITIN 677* 510*      Radiology Studies: I have reviewed all of the imaging during this hospital visit personally     Scheduled Meds: . [START ON 01/26/2019] amLODipine  10 mg Oral Once per day on Sun Wed  . aspirin  81 mg Oral Daily  . calcium-vitamin D  1 tablet Oral Daily  .  clopidogrel  75 mg Oral Daily  . dexamethasone (DECADRON) injection  6 mg Intravenous Q24H  . donepezil  20 mg Oral QHS  . enoxaparin (LOVENOX) injection  40 mg Subcutaneous Q24H  . insulin aspart  0-15 Units Subcutaneous TID WC  . multivitamin  1 tablet Oral QHS  . omega-3 acid ethyl esters  2 g Oral Daily  . sodium chloride flush  3 mL Intravenous Q12H  . tamsulosin  0.8 mg Oral QHS  . vitamin C  500 mg Oral Daily  . zinc sulfate  220 mg Oral Daily   Continuous Infusions: . sodium chloride    . lactated ringers 50 mL/hr at 01/25/19 0615     LOS: 3 days        Lore Polka Annett Gula, MD

## 2019-01-25 NOTE — Progress Notes (Signed)
Remdesivir - Pharmacy Brief Note   O:  ALT: 22 CXR: New hazy airspace opacity at the right base compatible with pneumonia. SpO2: 98% on room air   A/P:  Remdesivir 200 mg once followed by 100 mg daily x 4 days.   Gretta Arab PharmD, BCPS Clinical pharmacist phone 7am- 5pm: [phone] 01/25/2019 12:14 PM

## 2019-01-26 LAB — COMPREHENSIVE METABOLIC PANEL
ALT: 18 U/L (ref 0–44)
AST: 21 U/L (ref 15–41)
Albumin: 3 g/dL — ABNORMAL LOW (ref 3.5–5.0)
Alkaline Phosphatase: 55 U/L (ref 38–126)
Anion gap: 10 (ref 5–15)
BUN: 37 mg/dL — ABNORMAL HIGH (ref 8–23)
CO2: 25 mmol/L (ref 22–32)
Calcium: 8.3 mg/dL — ABNORMAL LOW (ref 8.9–10.3)
Chloride: 104 mmol/L (ref 98–111)
Creatinine, Ser: 1.1 mg/dL (ref 0.61–1.24)
GFR calc Af Amer: >60 mL/min (ref >60)
GFR calc non Af Amer: >60 mL/min (ref >60)
Glucose, Bld: 177 mg/dL — ABNORMAL HIGH (ref 70–99)
Potassium: 3.9 mmol/L (ref 3.5–5.1)
Sodium: 139 mmol/L (ref 135–145)
Total Bilirubin: 1 mg/dL (ref 0.3–1.2)
Total Protein: 5.5 g/dL — ABNORMAL LOW (ref 6.5–8.1)

## 2019-01-26 LAB — D-DIMER, QUANTITATIVE: D-Dimer, Quant: 0.81 ug/mL-FEU — ABNORMAL HIGH (ref 0.00–0.50)

## 2019-01-26 LAB — CBC WITH DIFFERENTIAL/PLATELET
Abs Immature Granulocytes: 0.07 10*3/uL (ref 0.00–0.07)
Basophils Absolute: 0 10*3/uL (ref 0.0–0.1)
Basophils Relative: 0 %
Eosinophils Absolute: 0 10*3/uL (ref 0.0–0.5)
Eosinophils Relative: 0 %
HCT: 29.2 % — ABNORMAL LOW (ref 39.0–52.0)
Hemoglobin: 9.8 g/dL — ABNORMAL LOW (ref 13.0–17.0)
Immature Granulocytes: 1 %
Lymphocytes Relative: 6 %
Lymphs Abs: 0.4 10*3/uL — ABNORMAL LOW (ref 0.7–4.0)
MCH: 30.2 pg (ref 26.0–34.0)
MCHC: 33.6 g/dL (ref 30.0–36.0)
MCV: 89.8 fL (ref 80.0–100.0)
Monocytes Absolute: 0.3 10*3/uL (ref 0.1–1.0)
Monocytes Relative: 4 %
Neutro Abs: 6.8 10*3/uL (ref 1.7–7.7)
Neutrophils Relative %: 89 %
Platelets: 162 10*3/uL (ref 150–400)
RBC: 3.25 MIL/uL — ABNORMAL LOW (ref 4.22–5.81)
RDW: 13.2 % (ref 11.5–15.5)
WBC: 7.6 10*3/uL (ref 4.0–10.5)
nRBC: 0 % (ref 0.0–0.2)

## 2019-01-26 LAB — C-REACTIVE PROTEIN: CRP: 2.3 mg/dL — ABNORMAL HIGH (ref <1.0)

## 2019-01-26 LAB — GLUCOSE, CAPILLARY
Glucose-Capillary: 327 mg/dL — ABNORMAL HIGH (ref 70–99)
Glucose-Capillary: 421 mg/dL — ABNORMAL HIGH (ref 70–99)
Glucose-Capillary: 275 mg/dL — ABNORMAL HIGH (ref 70–99)
Glucose-Capillary: 75 mg/dL (ref 70–99)

## 2019-01-26 LAB — FERRITIN: Ferritin: 502 ng/mL — ABNORMAL HIGH (ref 24–336)

## 2019-01-26 MED ORDER — INSULIN ASPART 100 UNIT/ML ~~LOC~~ SOLN
3.0000 [IU] | Freq: Three times a day (TID) | SUBCUTANEOUS | Status: DC
  Administered 2019-01-26 – 2019-01-31 (×10): 3 [IU] via SUBCUTANEOUS

## 2019-01-26 MED ORDER — INSULIN ASPART 100 UNIT/ML ~~LOC~~ SOLN
15.0000 [IU] | Freq: Once | SUBCUTANEOUS | Status: AC
  Administered 2019-01-26: 15 [IU] via SUBCUTANEOUS

## 2019-01-26 NOTE — Progress Notes (Signed)
PROGRESS NOTE    Jesus [name]  ENI:[id number] DOB: 01-09-1935 DOA: 01/22/2019 PCP: Barbette Reichmann, MD    Brief Narrative:  84 year old male who presented with dyspnea and altered mental status. He does have significant past medical history for type 2 diabetes mellitus, hypertension and dementia. Not feeling well for about 9 days, with decrease in cognitive function generalized weakness, emergency department visit on September 28, eventually discharge home from the ED, October 5 tested positive for SARS COVID-19. At home he continued to have rapid decline in his cognitive function. On his initial physical examination blood pressure was 150/65, pulse rate 67, respiratory 12, temperature 98.5, oxygen saturation 96% on room air. His lungs are clear to auscultation bilaterally, heart S1-S2 present with me, abdomen soft, no lower extremity edema. Sodium 139, potassium 3.7, chloride 106, bicarb 21, glucose 71, BUN 19, creatinine 1.0, AST 24, ALT 13, ferritin 397, CRP 3.8, procalcitonin less than 0.10, white count 4.2, hemoglobin 9.9, hematocrit 35.6, platelet 119. His chest radiograph had a faint interstitial infiltrate in the right base. Mild cardiomegaly. His electrocardiogram had 63 bpm, intermittent atrial pacing, ventricular pacing.  Patient was admitted to the hospital working diagnosis of SARS COVID-19 viral pneumonia complicated by metabolic encephalopathy.  Patient remains confused but not agitated, denies dyspnea or cough. Plan to complete 5 days of Remdesivir in the hospital. Plan to return home with hid brother.  Oxymetry has been 95 to 100% on room air.    Assessment & Plan:   Principal Problem:   Pneumonia due to COVID-19 virus Active Problems:   Dementia without behavioral disturbance (HCC)   Encephalopathy due to COVID-19 virus   DM (diabetes mellitus) (HCC)   HTN (hypertension)    1. SARS COVID 19 viral pneumonia, complicated with metabolic  encephalopathy.Continue to been stable, no agitation and his confusion is likely at the baseline.   Fi02: RA 21% Pulse oxymetry: 94 to 95%  Ferritin 397<677>500<502 CRP 3,8 <5,3>4,5>2,6>2.3 D dimer 0.53.< 0.65>0,45<0.81  Tolerating well Remdesivir #2/5 (AST 21 and ALT 18). Systemic corticosteroids, IVdexamethasone 6 mg daily. Continue withvitamin C and zinc.Responding well to medical therapy.   2. Dementia. Continue with donepezil.   3. T2DM with steroid induced hyperglycemia.His fasting glucose this am is up to 177, capillary glucose: 238, 202, 327, 421. Continue with basal 10 units of glargine, along with insulin sliding scale (moderate sensitivity). Will add 3 units of pre-meal insulin.   4. HTN.Blood pressure control with amlodipine.  5. AKI with anion gap metabolic acidosis.  Renal function improving, with serum cr at 1,10 with K at 3,9 and serum bicarbonate at 25. Will hold on LR for now and will continue close follow up of renal function and electrolytes.  DVT prophylaxis:enoxaparin Code Status:full Family Communication:no family at the bedside Disposition Plan/ discharge barriers:pending completion of treatment with Remdesivir.    Body mass index is 24.55 kg/m. Malnutrition Type:      Malnutrition Characteristics:      Nutrition Interventions:     RN Pressure Injury Documentation:     Consultants:     Procedures:     Antimicrobials:   Remdesivir.     Subjective: Patient is feeling well today, denies any dyspnea or chest pain, no nausea or vomiting, continue to need assistance for feeding.   Objective: Vitals:   01/25/19 1940 01/26/19 0339 01/26/19 0520 01/26/19 0805  BP: 136/65 (!) 142/56 (!) 123/50 (!) 150/66  Pulse: 68 62 60 64  Resp: 19 20 20 18   Temp: 98.4 F (  36.9 C) 99 F (37.2 C) 98.4 F (36.9 C) 97.7 F (36.5 C)  TempSrc: Oral Oral Oral Oral  SpO2: 98% 96% 94% 95%  Weight:      Height:         Intake/Output Summary (Last 24 hours) at 01/26/2019 0837 Last data filed at 01/26/2019 [id number] Gross per 24 hour  Intake 2758.71 ml  Output 2000 ml  Net 758.71 ml   Filed Weights   01/22/19 2048  Weight: 71.1 kg    Examination:   General: Not in pain or dyspnea, deconditioned  Neurology: Awake and alert, non focal  E ENT: mild pallor, no icterus, oral mucosa moist Cardiovascular: No JVD. S1-S2 present, rhythmic, no gallops, rubs, or murmurs. No lower extremity edema. Pulmonary: positive breath sounds bilaterally, adequate air movement, no wheezing, rhonchi or rales. Gastrointestinal. Abdomen with no organomegaly, non tender, no rebound or guarding Skin. No rashes Musculoskeletal: no joint deformities     Data Reviewed: I have personally reviewed following labs and imaging studies  CBC: Recent Labs  Lab 01/22/19 1212 01/23/19 0105 01/24/19 0055 01/25/19 0354 01/26/19 0134  WBC 4.2 4.9 6.6 6.1 7.6  NEUTROABS 2.8 4.3 5.7 5.3 6.8  HGB 11.9* 11.1* 11.9* 10.8* 9.8*  HCT 35.6* 33.2* 35.3* 31.8* 29.2*  MCV 89.2 90.7 89.8 90.3 89.8  PLT 119* 123* 143* 149* 162   Basic Metabolic Panel: Recent Labs  Lab 01/22/19 1212 01/23/19 0105 01/24/19 0055 01/25/19 0354 01/26/19 0134  NA 139 137 137 137 139  K 3.7 3.9 3.7 4.7 3.9  CL 106 103 101 103 104  CO2 21* 19* 21* 23 25  GLUCOSE 71 146* 187* 283* 177*  BUN 19 19 32* 49* 37*  CREATININE 1.09 1.04 1.20 1.45* 1.10  CALCIUM 8.4* 8.1* 8.4* 8.6* 8.3*   GFR: Estimated Creatinine Clearance: 48.4 mL/min (by C-G formula based on SCr of 1.1 mg/dL). Liver Function Tests: Recent Labs  Lab 01/22/19 1212 01/23/19 0105 01/24/19 0055 01/25/19 0354 01/26/19 0134  AST 24 29 31 24 21   ALT 13 16 22 22 18   ALKPHOS 59 58 63 63 55  BILITOT 0.8 0.7 0.9 0.8 1.0  PROT 6.3* 6.1* 6.4* 6.2* 5.5*  ALBUMIN 3.5 3.3* 3.4* 3.3* 3.0*   No results for input(s): LIPASE, AMYLASE in the last 168 hours. No results for input(s): AMMONIA in the last  168 hours. Coagulation Profile: No results for input(s): INR, PROTIME in the last 168 hours. Cardiac Enzymes: No results for input(s): CKTOTAL, CKMB, CKMBINDEX, TROPONINI in the last 168 hours. BNP (last 3 results) No results for input(s): PROBNP in the last 8760 hours. HbA1C: No results for input(s): HGBA1C in the last 72 hours. CBG: Recent Labs  Lab 01/25/19 0721 01/25/19 1128 01/25/19 1617 01/25/19 2107 01/26/19 0807  GLUCAP 352* 413* 238* 202* 327*   Lipid Profile: No results for input(s): CHOL, HDL, LDLCALC, TRIG, CHOLHDL, LDLDIRECT in the last 72 hours. Thyroid Function Tests: No results for input(s): TSH, T4TOTAL, FREET4, T3FREE, THYROIDAB in the last 72 hours. Anemia Panel: Recent Labs    01/25/19 0354 01/26/19 0134  FERRITIN 510* 502*      Radiology Studies: I have reviewed all of the imaging during this hospital visit personally     Scheduled Meds: . amLODipine  10 mg Oral Once per day on Sun Wed  . aspirin  81 mg Oral Daily  . calcium-vitamin D  1 tablet Oral Daily  . clopidogrel  75 mg Oral Daily  . dexamethasone (DECADRON)  injection  6 mg Intravenous Q24H  . donepezil  20 mg Oral QHS  . enoxaparin (LOVENOX) injection  40 mg Subcutaneous Q24H  . insulin aspart  0-15 Units Subcutaneous TID WC  . insulin glargine  10 Units Subcutaneous Daily  . multivitamin  1 tablet Oral QHS  . omega-3 acid ethyl esters  2 g Oral Daily  . sodium chloride flush  3 mL Intravenous Q12H  . tamsulosin  0.8 mg Oral QHS  . vitamin C  500 mg Oral Daily  . zinc sulfate  220 mg Oral Daily   Continuous Infusions: . sodium chloride    . lactated ringers 75 mL/hr at 01/26/19 0500  . remdesivir 100 mg in NS 250 mL       LOS: 4 days        Mauricio Gerome Apley, MD

## 2019-01-26 NOTE — Progress Notes (Signed)
MD notified of BS 421. See new order.

## 2019-01-26 NOTE — Plan of Care (Signed)
  Problem: Coping: Goal: Psychosocial and spiritual needs will be supported Outcome: Progressing   Problem: Respiratory: Goal: Will maintain a patent airway Outcome: Progressing   Problem: Respiratory: Goal: Complications related to the disease process, condition or treatment will be avoided or minimized Outcome: Progressing   

## 2019-01-26 NOTE — Progress Notes (Signed)
Pt's CBG is 75. OJ and graham crackers given. Pt is asymptomatic

## 2019-01-26 NOTE — Progress Notes (Signed)
Full SBAR report received from [facility] during BSR. Pt is resting in bed, condom cath in place, VSS. He denies pain and SOB. Call light wtihin reach, bed alarm is set

## 2019-01-27 LAB — CBC WITH DIFFERENTIAL/PLATELET
Abs Immature Granulocytes: 0.06 10*3/uL (ref 0.00–0.07)
Basophils Absolute: 0 10*3/uL (ref 0.0–0.1)
Basophils Relative: 0 %
Eosinophils Absolute: 0 10*3/uL (ref 0.0–0.5)
Eosinophils Relative: 0 %
HCT: 30.3 % — ABNORMAL LOW (ref 39.0–52.0)
Hemoglobin: 10.4 g/dL — ABNORMAL LOW (ref 13.0–17.0)
Immature Granulocytes: 1 %
Lymphocytes Relative: 10 %
Lymphs Abs: 0.7 10*3/uL (ref 0.7–4.0)
MCH: 30.3 pg (ref 26.0–34.0)
MCHC: 34.3 g/dL (ref 30.0–36.0)
MCV: 88.3 fL (ref 80.0–100.0)
Monocytes Absolute: 0.3 10*3/uL (ref 0.1–1.0)
Monocytes Relative: 4 %
Neutro Abs: 5.7 10*3/uL (ref 1.7–7.7)
Neutrophils Relative %: 85 %
Platelets: 178 10*3/uL (ref 150–400)
RBC: 3.43 MIL/uL — ABNORMAL LOW (ref 4.22–5.81)
RDW: 13.2 % (ref 11.5–15.5)
WBC: 6.7 10*3/uL (ref 4.0–10.5)
nRBC: 0 % (ref 0.0–0.2)

## 2019-01-27 LAB — C-REACTIVE PROTEIN: CRP: 3.3 mg/dL — ABNORMAL HIGH (ref <1.0)

## 2019-01-27 LAB — COMPREHENSIVE METABOLIC PANEL
ALT: 18 U/L (ref 0–44)
AST: 21 U/L (ref 15–41)
Albumin: 2.8 g/dL — ABNORMAL LOW (ref 3.5–5.0)
Alkaline Phosphatase: 52 U/L (ref 38–126)
Anion gap: 10 (ref 5–15)
BUN: 34 mg/dL — ABNORMAL HIGH (ref 8–23)
CO2: 25 mmol/L (ref 22–32)
Calcium: 8.2 mg/dL — ABNORMAL LOW (ref 8.9–10.3)
Chloride: 104 mmol/L (ref 98–111)
Creatinine, Ser: 0.98 mg/dL (ref 0.61–1.24)
GFR calc Af Amer: >60 mL/min (ref >60)
GFR calc non Af Amer: >60 mL/min (ref >60)
Glucose, Bld: 111 mg/dL — ABNORMAL HIGH (ref 70–99)
Potassium: 3.8 mmol/L (ref 3.5–5.1)
Sodium: 139 mmol/L (ref 135–145)
Total Bilirubin: 1 mg/dL (ref 0.3–1.2)
Total Protein: 5.4 g/dL — ABNORMAL LOW (ref 6.5–8.1)

## 2019-01-27 LAB — GLUCOSE, CAPILLARY
Glucose-Capillary: 116 mg/dL — ABNORMAL HIGH (ref 70–99)
Glucose-Capillary: 156 mg/dL — ABNORMAL HIGH (ref 70–99)
Glucose-Capillary: 84 mg/dL (ref 70–99)

## 2019-01-27 LAB — CULTURE, BLOOD (ROUTINE X 2)
Culture: NO GROWTH
Culture: NO GROWTH
Special Requests: ADEQUATE
Special Requests: ADEQUATE

## 2019-01-27 LAB — D-DIMER, QUANTITATIVE: D-Dimer, Quant: 0.89 ug/mL-FEU — ABNORMAL HIGH (ref 0.00–0.50)

## 2019-01-27 LAB — FERRITIN: Ferritin: 501 ng/mL — ABNORMAL HIGH (ref 24–336)

## 2019-01-27 NOTE — Plan of Care (Signed)
  Problem: Education: Goal: Knowledge of risk factors and measures for prevention of condition will improve Outcome: Progressing   Problem: Coping: Goal: Psychosocial and spiritual needs will be supported Outcome: Progressing   Problem: Respiratory: Goal: Will maintain a patent airway Outcome: Progressing   Problem: Respiratory: Goal: Complications related to the disease process, condition or treatment will be avoided or minimized Outcome: Progressing   

## 2019-01-27 NOTE — Progress Notes (Signed)
Full report received from Louann Liv RN during BSR. POC reviewed. Pt resting in bed, he is alert. He denies pain and SOB. Bed alarm is set and call light within reach, altho he does not use the call light. He is a.o x 1 but very pleasant.

## 2019-01-27 NOTE — Progress Notes (Signed)
PROGRESS NOTE    Jesus [name]  WUJ:[id number] DOB: October 28, 1934 DOA: 01/22/2019 PCP: Barbette ReichmannHande, Vishwanath, MD    Brief Narrative:  84 year old male who presented with dyspnea and altered mental status. He does have significant past medical history for type 2 diabetes mellitus, hypertension and dementia. Not feeling well for about 9 days, with decrease in cognitive function generalized weakness, emergency department visit on September 28, eventually discharge home from the ED, October 5 tested positive for SARS COVID-19. At home he continued to have rapid decline in his cognitive function. On his initial physical examination blood pressure was 150/65, pulse rate 67, respiratory 12, temperature 98.5, oxygen saturation 96% on room air. His lungs are clear to auscultation bilaterally, heart S1-S2 present with me, abdomen soft, no lower extremity edema. Sodium 139, potassium 3.7, chloride 106, bicarb 21, glucose 71, BUN 19, creatinine 1.0, AST 24, ALT 13, ferritin 397, CRP 3.8, procalcitonin less than 0.10, white count 4.2, hemoglobin 9.9, hematocrit 35.6, platelet 119. His chest radiograph had a faint interstitial infiltrate in the right base. Mild cardiomegaly. His electrocardiogram had 63 bpm, intermittent atrial pacing, ventricular pacing.  Patient was admitted to the hospital working diagnosis of SARS COVID-19 viral pneumonia complicated by metabolic encephalopathy.  Patient remains confused but not agitated, denies dyspnea or cough. Plan to complete 5 days of Remdesivir in the hospital. Plan to return home with his brother.  Oxymetry has been 95 to 100% on room air.   Assessment & Plan:   Principal Problem:   Pneumonia due to COVID-19 virus Active Problems:   Dementia without behavioral disturbance (HCC)   Encephalopathy due to COVID-19 virus   DM (diabetes mellitus) (HCC)   HTN (hypertension)    1. SARS COVID 19 viral pneumonia, complicated with metabolic  encephalopathy.Patient progressing well, no dyspnea or chest pain. Mentation likely at it's baseline.   RR: 16 Fi02: RA 21% Pulse oxymetry: 94 to 95%  Ferritin 397<677>500<502>501 CRP 3,8 <5,3>4,5>2,6>2.3<3.3 D dimer 0.53.< 0.65>0,45<0.81>0,89  Continue with Remdesivir #3/5 (AST 21 and ALT 18). Continue with corticosteroids,vitamin C and zinc.Continue to follow with inflammatory markers.   Plan to return home with brother after completion of Remdesivir.    2. Dementia.On donepezil.   3. T2DM with steroid induced hyperglycemia.Fasting glucose this am is 111, capillary glucose: 421, 275, 75, 116. On basal 10 units of glargine, along with insulin sliding scale (moderate sensitivity). Continue with 3 units of pre-meal insulin aspart.  4. HTN.Continue with amlodipine for blood pressure control.   5.AKI with anion gap metabolic acidosis. Recovered renal function with serum cr at 0,98 with K at 3,8 and serum bicarbonate at 25. Patient is tolerating po well.  DVT prophylaxis:enoxaparin Code Status:full Family Communication:no family at the bedside Disposition Plan/ discharge barriers:pending completion of treatment with Remdesivir.    Body mass index is 24.55 kg/m. Malnutrition Type:      Malnutrition Characteristics:      Nutrition Interventions:     RN Pressure Injury Documentation:     Consultants:     Procedures:     Antimicrobials:   Remdesivir    Subjective: Patient is feeling better, no nausea or vomiting, denies any chest pain or dyspnea.   Objective: Vitals:   01/26/19 1501 01/26/19 2000 01/27/19 0400 01/27/19 0700  BP: (!) 130/54 (!) 122/42 (!) 136/56 (!) 150/66  Pulse: 61 64 60 60  Resp: 16 18 16 16   Temp: 98.7 F (37.1 C) 99 F (37.2 C) 98.8 F (37.1 C) 97.6 F (36.4 C)  TempSrc: Oral Oral Oral Oral  SpO2: 93% 96% 94% 95%  Weight:      Height:        Intake/Output Summary (Last 24 hours) at 01/27/2019 0815  Last data filed at 01/27/2019 0400 Gross per 24 hour  Intake 480 ml  Output 1475 ml  Net -995 ml   Filed Weights   01/22/19 2048  Weight: 71.1 kg    Examination:   General: Not in pain or dyspnea, deconditioned  Neurology: Awake and alert, non focal. No agitation.  E ENT: no pallor, no icterus, oral mucosa moist Cardiovascular: No JVD. S1-S2 present, rhythmic, no gallops, rubs, or murmurs. No lower extremity edema. Pulmonary: positive breath sounds bilaterally, adequate air movement, no wheezing, rhonchi or rales. Gastrointestinal. Abdomen with no organomegaly, non tender, no rebound or guarding Skin. No rashes Musculoskeletal: no joint deformities     Data Reviewed: I have personally reviewed following labs and imaging studies  CBC: Recent Labs  Lab 01/23/19 0105 01/24/19 0055 01/25/19 0354 01/26/19 0134 01/27/19 0410  WBC 4.9 6.6 6.1 7.6 6.7  NEUTROABS 4.3 5.7 5.3 6.8 5.7  HGB 11.1* 11.9* 10.8* 9.8* 10.4*  HCT 33.2* 35.3* 31.8* 29.2* 30.3*  MCV 90.7 89.8 90.3 89.8 88.3  PLT 123* 143* 149* 162 423   Basic Metabolic Panel: Recent Labs  Lab 01/23/19 0105 01/24/19 0055 01/25/19 0354 01/26/19 0134 01/27/19 0410  NA 137 137 137 139 139  K 3.9 3.7 4.7 3.9 3.8  CL 103 101 103 104 104  CO2 19* 21* 23 25 25   GLUCOSE 146* 187* 283* 177* 111*  BUN 19 32* 49* 37* 34*  CREATININE 1.04 1.20 1.45* 1.10 0.98  CALCIUM 8.1* 8.4* 8.6* 8.3* 8.2*   GFR: Estimated Creatinine Clearance: 54.3 mL/min (by C-G formula based on SCr of 0.98 mg/dL). Liver Function Tests: Recent Labs  Lab 01/23/19 0105 01/24/19 0055 01/25/19 0354 01/26/19 0134 01/27/19 0410  AST 29 31 24 21 21   ALT 16 22 22 18 18   ALKPHOS 58 63 63 55 52  BILITOT 0.7 0.9 0.8 1.0 1.0  PROT 6.1* 6.4* 6.2* 5.5* 5.4*  ALBUMIN 3.3* 3.4* 3.3* 3.0* 2.8*   No results for input(s): LIPASE, AMYLASE in the last 168 hours. No results for input(s): AMMONIA in the last 168 hours. Coagulation Profile: No results for  input(s): INR, PROTIME in the last 168 hours. Cardiac Enzymes: No results for input(s): CKTOTAL, CKMB, CKMBINDEX, TROPONINI in the last 168 hours. BNP (last 3 results) No results for input(s): PROBNP in the last 8760 hours. HbA1C: No results for input(s): HGBA1C in the last 72 hours. CBG: Recent Labs  Lab 01/26/19 0807 01/26/19 1124 01/26/19 1549 01/26/19 2010 01/27/19 0728  GLUCAP 327* 421* 275* 75 116*   Lipid Profile: No results for input(s): CHOL, HDL, LDLCALC, TRIG, CHOLHDL, LDLDIRECT in the last 72 hours. Thyroid Function Tests: No results for input(s): TSH, T4TOTAL, FREET4, T3FREE, THYROIDAB in the last 72 hours. Anemia Panel: Recent Labs    01/26/19 0134 01/27/19 0410  FERRITIN 502* 501*      Radiology Studies: I have reviewed all of the imaging during this hospital visit personally     Scheduled Meds: . amLODipine  10 mg Oral Once per day on Sun Wed  . aspirin  81 mg Oral Daily  . calcium-vitamin D  1 tablet Oral Daily  . clopidogrel  75 mg Oral Daily  . dexamethasone (DECADRON) injection  6 mg Intravenous Q24H  . donepezil  20 mg Oral  QHS  . enoxaparin (LOVENOX) injection  40 mg Subcutaneous Q24H  . insulin aspart  0-15 Units Subcutaneous TID WC  . insulin aspart  3 Units Subcutaneous TID WC  . insulin glargine  10 Units Subcutaneous Daily  . multivitamin  1 tablet Oral QHS  . omega-3 acid ethyl esters  2 g Oral Daily  . sodium chloride flush  3 mL Intravenous Q12H  . tamsulosin  0.8 mg Oral QHS  . vitamin C  500 mg Oral Daily  . zinc sulfate  220 mg Oral Daily   Continuous Infusions: . sodium chloride    . remdesivir 100 mg in NS 250 mL 100 mg (01/26/19 0843)     LOS: 5 days        Mauricio Annett Gula, MD

## 2019-01-28 ENCOUNTER — Inpatient Hospital Stay (HOSPITAL_COMMUNITY): Payer: Medicare Other

## 2019-01-28 LAB — COMPREHENSIVE METABOLIC PANEL
ALT: 81 U/L — ABNORMAL HIGH (ref 0–44)
AST: 106 U/L — ABNORMAL HIGH (ref 15–41)
Albumin: 2.6 g/dL — ABNORMAL LOW (ref 3.5–5.0)
Alkaline Phosphatase: 78 U/L (ref 38–126)
Anion gap: 13 (ref 5–15)
BUN: 35 mg/dL — ABNORMAL HIGH (ref 8–23)
CO2: 22 mmol/L (ref 22–32)
Calcium: 8 mg/dL — ABNORMAL LOW (ref 8.9–10.3)
Chloride: 99 mmol/L (ref 98–111)
Creatinine, Ser: 1.22 mg/dL (ref 0.61–1.24)
GFR calc Af Amer: >60 mL/min (ref >60)
GFR calc non Af Amer: 55 mL/min — ABNORMAL LOW (ref >60)
Glucose, Bld: 362 mg/dL — ABNORMAL HIGH (ref 70–99)
Potassium: 3.9 mmol/L (ref 3.5–5.1)
Sodium: 134 mmol/L — ABNORMAL LOW (ref 135–145)
Total Bilirubin: 0.8 mg/dL (ref 0.3–1.2)
Total Protein: 5.2 g/dL — ABNORMAL LOW (ref 6.5–8.1)

## 2019-01-28 LAB — GLUCOSE, CAPILLARY
Glucose-Capillary: 279 mg/dL — ABNORMAL HIGH (ref 70–99)
Glucose-Capillary: 284 mg/dL — ABNORMAL HIGH (ref 70–99)
Glucose-Capillary: 276 mg/dL — ABNORMAL HIGH (ref 70–99)
Glucose-Capillary: 284 mg/dL — ABNORMAL HIGH (ref 70–99)

## 2019-01-28 LAB — C-REACTIVE PROTEIN: CRP: 8.5 mg/dL — ABNORMAL HIGH (ref <1.0)

## 2019-01-28 LAB — D-DIMER, QUANTITATIVE: D-Dimer, Quant: 2.33 ug/mL-FEU — ABNORMAL HIGH (ref 0.00–0.50)

## 2019-01-28 LAB — FERRITIN: Ferritin: 830 ng/mL — ABNORMAL HIGH (ref 24–336)

## 2019-01-28 MED ORDER — IOHEXOL 350 MG/ML SOLN
100.0000 mL | Freq: Once | INTRAVENOUS | Status: AC | PRN
  Administered 2019-01-28: 100 mL via INTRAVENOUS

## 2019-01-28 MED ORDER — LACTATED RINGERS IV SOLN
INTRAVENOUS | Status: DC
  Administered 2019-01-28 – 2019-01-29 (×2): via INTRAVENOUS

## 2019-01-28 NOTE — Progress Notes (Signed)
Physical Therapy Treatment Patient Details Name: Jesus [name] MRN: [id number] DOB: 10/27/1934 Today's Date: 01/28/2019    History of Present Illness 84 y/o male w/ hx of dementia, DM, HTN, brought to hospital by family after +COVID test and increasing weakness.    PT Comments    The patient was  Awake  For mobilizing to sitting and ambulated x 10' with RW.  Patient 's SPO2 noted to be 87% on RA. Placed on 2 l WITH spo2 REMAINING `90%. BP 115/62.  After sitting and standing agAin. Pt. Noted to be less alert, eyes deviated to left. BP 133/80.RN came to room.Patient remained asleep and on 2 L.PATIENT REMAINED SLEEPY. PER REPORT, PT TO RETURN HOME WITH 24/7 CAREGIVERS AS PT IS CLOSE TO [place].  Follow Up Recommendations  Home health PT;Supervision/Assistance - 24 hour     Equipment Recommendations  Rolling walker with 5" wheels    Recommendations for Other Services       Precautions / Restrictions Precautions Precautions: Fall Precaution Comments: HOH, watch sats Restrictions Weight Bearing Restrictions: No    Mobility  Bed Mobility Overal bed mobility: Needs Assistance Bed Mobility: Supine to Sit     Supine to sit: Mod assist     General bed mobility comments: multimodal sequencing cues to problem solve motion, needing cues for initiation as well. Assist ot guide legs and rise at trunk  Transfers Overall transfer level: Needs assistance Equipment used: Rolling walker (2 wheeled) Transfers: Sit to/from Stand Sit to Stand: Min assist;+2 physical assistance;+2 safety/equipment         General transfer comment: min A +2 to rise and steady, cues for safe hand placement  Ambulation/Gait Ambulation/Gait assistance: Mod assist Gait Distance (Feet): 5 Feet Assistive device: Rolling walker (2 wheeled) Gait Pattern/deviations: Shuffle Gait velocity: slow and shuffled   General Gait Details: steady assist  throughout short distance,, decreased balance. Sat to recliner  when brought up. Assisted to stand agian to brush teeth, pt. with decreased responses, assisted to recliner.  Eyes deviating to the left.   Stairs             Wheelchair Mobility    Modified Rankin (Stroke Patients Only)       Balance Overall balance assessment: Needs assistance Sitting-balance support: Feet supported Sitting balance-Leahy Scale: Fair     Standing balance support: Bilateral upper extremity supported Standing balance-Leahy Scale: Poor Standing balance comment: reliant on external support                            Cognition Arousal/Alertness: Awake/alert Behavior During Therapy: Flat affect Overall Cognitive Status: History of cognitive impairments - at baseline                                 General Comments: history of dementia. Oriented to self only, thinks he is in brothers house. Needing alerting commands throughout session      Exercises      General Comments        Pertinent Vitals/Pain Pain Assessment: No/denies pain    Home Living Family/patient expects to be discharged to:: Private residence Living Arrangements: Other relatives(brother) Available Help at Discharge: Family           Additional Comments: pt unable to provide home set up at time of ecal. Attempted to call brother without answer    Prior Function  Comments: states he does not use a RW, unsure given pt is poor historian   PT Goals (current goals can now be found in the care plan section) Progress towards PT goals: Progressing toward goals    Frequency    Min 2X/week      PT Plan Current plan remains appropriate    Co-evaluation PT/OT/SLP Co-Evaluation/Treatment: Yes Reason for Co-Treatment: For patient/therapist safety PT goals addressed during session: Mobility/safety with mobility        AM-PAC PT "6 Clicks" Mobility   Outcome Measure  Help needed turning from your back to your side while in a flat bed without  using bedrails?: A Lot Help needed moving from lying on your back to sitting on the side of a flat bed without using bedrails?: A Lot Help needed moving to and from a bed to a chair (including a wheelchair)?: A Lot Help needed standing up from a chair using your arms (e.g., wheelchair or bedside chair)?: Total Help needed to walk in hospital room?: Total Help needed climbing 3-5 steps with a railing? : Total 6 Click Score: 9    End of Session Equipment Utilized During Treatment: Gait belt Activity Tolerance: Treatment limited secondary to medical complications (Comment) Patient left: in chair;with call bell/phone within reach;with chair alarm set Nurse Communication: Mobility status;Other (comment) PT Visit Diagnosis: Muscle weakness (generalized) (M62.81);Other abnormalities of gait and mobility (R26.89)     Time: 3614-4315 PT Time Calculation (min) (ACUTE ONLY): 50 min  Charges:  $Gait Training: 8-22 mins                     Blanchard Kelch PT Acute Rehabilitation Services Pager [phone] Office [phone]    Rada Hay 01/28/2019, 4:49 PM

## 2019-01-28 NOTE — Plan of Care (Signed)
POC reviewed

## 2019-01-28 NOTE — Progress Notes (Signed)
Condom cath was replaced, pt was bathed and linens changed

## 2019-01-28 NOTE — Progress Notes (Signed)
PROGRESS NOTE    Jesus [name]  LNL:[id number] DOB: 1934-04-25 DOA: 01/22/2019 PCP: Barbette Reichmann, MD    Brief Narrative:  84 year old male who presented with dyspnea and altered mental status. He does have significant past medical history for type 2 diabetes mellitus, hypertension and dementia. Not feeling well for about 9 days, with decrease in cognitive function and generalized weakness, had one emergency department visit on September 28, eventually discharge home from the ED, October 5 tested positive for SARS COVID-19. At home he continued to have rapid decline in his cognitive function. On his initial physical examination blood pressure was 150/65, pulse rate 67, respiratory rate 12, temperature 98.5, oxygen saturation 96% on room air. His lungs were clear to auscultation bilaterally, heart S1-S2 present and rhythmic, abdomen soft, no lower extremity edema. Sodium 139, potassium 3.7, chloride 106, bicarb 21, glucose 71, BUN 19, creatinine 1.0, AST 24, ALT 13, ferritin 397, CRP 3.8, procalcitonin less than 0.10, white count 4.2, hemoglobin 9.9, hematocrit 35.6, platelet 119. His chest radiograph had a faint interstitial infiltrate in the right base. Mild cardiomegaly. His electrocardiogram had 63 bpm, intermittent atrial pacing, ventricular pacing.  Patient was admitted to the hospital working diagnosis of SARS COVID-19 viral pneumonia complicated by metabolic encephalopathy.  Patient remains confused but not agitated, denies dyspnea or cough. Plan to complete 5 days of Remdesivir in the hospital, and the return home with his brother.  Oxymetry has been 95 to 100% on room air.  Today he has worsening mentation and worsening in inflammatory markers.    Assessment & Plan:   Principal Problem:   Pneumonia due to COVID-19 virus Active Problems:   Dementia without behavioral disturbance (HCC)   Encephalopathy due to COVID-19 virus   DM (diabetes mellitus) (HCC)   HTN  (hypertension)    1. SARS COVID 19 viral pneumonia, complicated with metabolic encephalopathy.Patient has been progressing well from his viral pneumonia, but today he has been more somnolent and less reactive. Mild increase his oxygen requirements to 2 LPM from room air.   RR: 16 Fi02:RA 28% Pulse oxymetry: 92%  Ferritin 397<677>500<502>501<830 CRP 3,8 <5,3>4,5>2,6>2.3<3.3<8.5 D dimer 0.53.< 0.65>0,45<0.81>0,89< 2.3  Continue with Remdesivir#4/5(worsening AST 106and ALT 81). On dexamethasone IV 6 mg daily. Vitamin C and zinc.Considering worsening oxygenation and d dimer will doppler US lower extremities and CT chest to rule out PE. For now continue on prophylactic anticoagulation.   2. Dementia.Continue withdonepezil. Patient will need 24 H supervision at discharge.   3. T2DM with steroid induced hyperglycemia.Fasting glucose this am is 362, capillary glucose drop to 84 yesterday and patient was symptomativc. Will plan to continue with basal10 units of glargine, along withinsulin sliding scale (sensitive scale) and 3 units of pre-meal insulin aspart. Hold rapid acting insulin if patient encephalopathic.   4. HTN.Blood pressure 133/ 80, will continue with amlodipine.  5.AKI with anion gap metabolic acidosis.Worsening renal function with serum cr at 1,22 with K at 3,9 and serum bicarbonate at 22. Will add gentle hydration with balanced electrolyte solutions, follow on renal panel in am.   DVT prophylaxis:enoxaparin Code Status:full Family Communication:no family at the bedside Disposition Plan/ discharge barriers:pending completion of treatment with Remdesivir/ clinical improvement,     Body mass index is 24.55 kg/m. Malnutrition Type:      Malnutrition Characteristics:      Nutrition Interventions:     RN Pressure Injury Documentation:     Consultants:     Procedures:     Antimicrobials:  Subjective: Today patient  has been more somnolent and decreased po intake, no nausea or vomiting, denies any chest pain or dyspnea. Had episodic hypoglycemia last night.   Objective: Vitals:   01/27/19 1615 01/27/19 2000 01/28/19 0800 01/28/19 1042  BP: 135/68 (!) 142/60 135/68 133/80  Pulse: 64 61 60 66  Resp: 16 16 16 16   Temp: 97.9 F (36.6 C) 98.2 F (36.8 C) 98.4 F (36.9 C) 98.2 F (36.8 C)  TempSrc: Oral Oral Oral Oral  SpO2: 95% 96% 95% 92%  Weight:      Height:        Intake/Output Summary (Last 24 hours) at 01/28/2019 1435 Last data filed at 01/28/2019 0300 Gross per 24 hour  Intake 480 ml  Output -  Net 480 ml   Filed Weights   01/22/19 2048  Weight: 71.1 kg    Examination:   General: Not in pain or dyspnea, deconditioned  Neurology: Somnolent but easy to arouse.  E ENT: no pallor, no icterus, oral mucosa moist Cardiovascular: No JVD. S1-S2 present, rhythmic, no gallops, rubs, or murmurs. No lower extremity edema. Pulmonary: positive breath sounds bilaterally.  Gastrointestinal. Abdomen with no organomegaly, non tender, no rebound or guarding Skin. No rashes Musculoskeletal: no joint deformities     Data Reviewed: I have personally reviewed following labs and imaging studies  CBC: Recent Labs  Lab 01/23/19 0105 01/24/19 0055 01/25/19 0354 01/26/19 0134 01/27/19 0410  WBC 4.9 6.6 6.1 7.6 6.7  NEUTROABS 4.3 5.7 5.3 6.8 5.7  HGB 11.1* 11.9* 10.8* 9.8* 10.4*  HCT 33.2* 35.3* 31.8* 29.2* 30.3*  MCV 90.7 89.8 90.3 89.8 88.3  PLT 123* 143* 149* 162 178   Basic Metabolic Panel: Recent Labs  Lab 01/24/19 0055 01/25/19 0354 01/26/19 0134 01/27/19 0410 01/28/19 0055  NA 137 137 139 139 134*  K 3.7 4.7 3.9 3.8 3.9  CL 101 103 104 104 99  CO2 21* 23 25 25 22   GLUCOSE 187* 283* 177* 111* 362*  BUN 32* 49* 37* 34* 35*  CREATININE 1.20 1.45* 1.10 0.98 1.22  CALCIUM 8.4* 8.6* 8.3* 8.2* 8.0*   GFR: Estimated Creatinine Clearance: 43.6 mL/min (by C-G formula based on SCr  of 1.22 mg/dL). Liver Function Tests: Recent Labs  Lab 01/24/19 0055 01/25/19 0354 01/26/19 0134 01/27/19 0410 01/28/19 0055  AST 31 24 21 21  106*  ALT 22 22 18 18  81*  ALKPHOS 63 63 55 52 78  BILITOT 0.9 0.8 1.0 1.0 0.8  PROT 6.4* 6.2* 5.5* 5.4* 5.2*  ALBUMIN 3.4* 3.3* 3.0* 2.8* 2.6*   No results for input(s): LIPASE, AMYLASE in the last 168 hours. No results for input(s): AMMONIA in the last 168 hours. Coagulation Profile: No results for input(s): INR, PROTIME in the last 168 hours. Cardiac Enzymes: No results for input(s): CKTOTAL, CKMB, CKMBINDEX, TROPONINI in the last 168 hours. BNP (last 3 results) No results for input(s): PROBNP in the last 8760 hours. HbA1C: No results for input(s): HGBA1C in the last 72 hours. CBG: Recent Labs  Lab 01/27/19 1138 01/27/19 1811 01/27/19 2114 01/28/19 0810 01/28/19 1053  GLUCAP 156* 84 279* 284* 276*   Lipid Profile: No results for input(s): CHOL, HDL, LDLCALC, TRIG, CHOLHDL, LDLDIRECT in the last 72 hours. Thyroid Function Tests: No results for input(s): TSH, T4TOTAL, FREET4, T3FREE, THYROIDAB in the last 72 hours. Anemia Panel: Recent Labs    01/27/19 0410 01/28/19 0055  FERRITIN 501* 830*      Radiology Studies: I have reviewed all  of the imaging during this hospital visit personally     Scheduled Meds: . amLODipine  10 mg Oral Once per day on Sun Wed  . aspirin  81 mg Oral Daily  . calcium-vitamin D  1 tablet Oral Daily  . clopidogrel  75 mg Oral Daily  . dexamethasone (DECADRON) injection  6 mg Intravenous Q24H  . donepezil  20 mg Oral QHS  . enoxaparin (LOVENOX) injection  40 mg Subcutaneous Q24H  . insulin aspart  0-15 Units Subcutaneous TID WC  . insulin aspart  3 Units Subcutaneous TID WC  . insulin glargine  10 Units Subcutaneous Daily  . multivitamin  1 tablet Oral QHS  . omega-3 acid ethyl esters  2 g Oral Daily  . sodium chloride flush  3 mL Intravenous Q12H  . tamsulosin  0.8 mg Oral QHS  .  vitamin C  500 mg Oral Daily  . zinc sulfate  220 mg Oral Daily   Continuous Infusions: . sodium chloride    . remdesivir 100 mg in NS 250 mL 100 mg (01/28/19 0943)     LOS: 6 days        Lonnell Chaput Gerome Apley, MD

## 2019-01-28 NOTE — Progress Notes (Addendum)
I spoke over the phone with the patient's brother about patient's  condition, plan of care and all questions were addressed.  His brother would like him to go to a short-term SNF for his recovery.

## 2019-01-28 NOTE — Evaluation (Signed)
Occupational Therapy Evaluation Patient Details Name: Jesus [name] MRN: [id number] DOB: 01/27/1935 Today's Date: 01/28/2019    History of Present Illness 84 y/o male w/ hx of dementia, DM, HTN, brought to hospital by family after +COVID test and increasing weakness.   Clinical Impression   Pt admitted with above diagnoses, presenting with baseline cognitive defcitis, generalized weakness, and compromised cardiopulmonary status. Pt is poor historian 2/2 to baseline dementia. Per chart review know he lives with brother. Left message for brother to call back to confirm status. At time of evaluation, pt is mod A for bed mobility and min A +2 for sit <> stands with RW. Pt completed functional mobility with min A +2 <> sink, then satting around 83-88 % requiring 2L [place] of supplemental O2 for VSS. Probe both on finger and ear tested, not consistently staying attached to ear throughout session. Pt was not able to sustain standing to complete BADL task at sink this date. He then had a brief episode of fluctuating awareness, with eyes open and fixated to L and inability to respond. VSS at this time, no evidence of orthostasis. Pt then appearing fatigued and falling asleep, but able to respond to name and seemed back to baseline. RN came in to assess situation, CBG read 276. Unsure of amount of assist provided in the home at baseline per family. Pt will need 24/7 supervision and physical assist to return home, otherwise SNF placement will be needed. Will continue to follow acutely.     Follow Up Recommendations  Home health OT;Supervision/Assistance - 24 hour;SNF;Other (comment)(pending if family can provide 24/7)    Equipment Recommendations  3 in 1 bedside commode;Wheelchair (measurements OT);Wheelchair cushion (measurements OT);Other (comment)(will need to confirm equipment already at home)    Recommendations for Other Services       Precautions / Restrictions Precautions Precautions: Fall Precaution  Comments: HOH, watch sats Restrictions Weight Bearing Restrictions: No      Mobility Bed Mobility Overal bed mobility: Needs Assistance Bed Mobility: Supine to Sit     Supine to sit: Mod assist     General bed mobility comments: multimodal sequencing cues to problem solve motion, needing cues for initiation as well. Assist ot guide legs and rise at trunk  Transfers Overall transfer level: Needs assistance Equipment used: Rolling walker (2 wheeled) Transfers: Sit to/from Stand Sit to Stand: Min assist;+2 physical assistance;+2 safety/equipment         General transfer comment: min A +2 to rise and steady, cues for safe hand placement    Balance Overall balance assessment: Needs assistance Sitting-balance support: Feet supported Sitting balance-Leahy Scale: Fair     Standing balance support: Single extremity supported Standing balance-Leahy Scale: Poor Standing balance comment: reliant on external support                           ADL either performed or assessed with clinical judgement   ADL Overall ADL's : Needs assistance/impaired Eating/Feeding: Set up;Sitting   Grooming: Set up;Standing Grooming Details (indicate cue type and reason): attmpeted in standing with min A to steady, pt could not maintain activity tolerance to stand for duration of task Upper Body Bathing: Minimal assistance;Sitting;Cueing for sequencing   Lower Body Bathing: Moderate assistance;Sit to/from stand;Sitting/lateral leans;Cueing for sequencing   Upper Body Dressing : Minimal assistance;Sitting;Cueing for sequencing   Lower Body Dressing: Cueing for sequencing;Sitting/lateral leans;Sit to/from stand;Moderate assistance   Toilet Transfer: Minimal assistance;+2 for safety/equipment;BSC;RW Toilet Transfer Details (indicate  cue type and reason): simulated with recliner Toileting- Clothing Manipulation and Hygiene: Minimal assistance;Sit to/from stand;Sitting/lateral  lean Toileting - Clothing Manipulation Details (indicate cue type and reason): can be incontinent, had condom cath on     Functional mobility during ADLs: Minimal assistance;+2 for physical assistance;+2 for safety/equipment;Rolling walker General ADL Comments: pt ltd by caseline cognitive deficits, decreased activity tolerance and compromised cardiopulmonary status     Vision   Vision Assessment?: No apparent visual deficits     Perception     Praxis      Pertinent Vitals/Pain Pain Assessment: No/denies pain     Hand Dominance     Extremity/Trunk Assessment Upper Extremity Assessment Upper Extremity Assessment: Generalized weakness   Lower Extremity Assessment Lower Extremity Assessment: Defer to PT evaluation   Cervical / Trunk Assessment Cervical / Trunk Assessment: Normal   Communication Communication Communication: HOH   Cognition Arousal/Alertness: Awake/alert Behavior During Therapy: Flat affect Overall Cognitive Status: History of cognitive impairments - at baseline                                 General Comments: history of dementia. Oriented to self only, thinks he is in brothers house. Needing alerting commands throughout session   General Comments       Exercises     Shoulder Instructions      Home Living Family/patient expects to be discharged to:: Private residence Living Arrangements: Other relatives(brother) Available Help at Discharge: Family                             Additional Comments: pt unable to provide home set up at time of ecal. Attempted to call brother without answer      Prior Functioning/Environment          Comments: states he does not use a RW, unsure given pt is poor historian        OT Problem List: Decreased strength;Decreased knowledge of use of DME or AE;Decreased activity tolerance;Decreased cognition;Cardiopulmonary status limiting activity;Impaired balance (sitting and/or  standing);Decreased safety awareness      OT Treatment/Interventions: Self-care/ADL training;Therapeutic exercise;Patient/family education;Balance training;Energy conservation;Therapeutic activities;DME and/or AE instruction;Cognitive remediation/compensation    OT Goals(Current goals can be found in the care plan section) Acute Rehab OT Goals OT Goal Formulation: Patient unable to participate in goal setting Time For Goal Achievement: 02/11/19 Potential to Achieve Goals: Good  OT Frequency: Min 2X/week   Barriers to D/C:            Co-evaluation              AM-PAC OT "6 Clicks" Daily Activity     Outcome Measure Help from another person eating meals?: A Little Help from another person taking care of personal grooming?: A Little(seated) Help from another person toileting, which includes using toliet, bedpan, or urinal?: A Lot Help from another person bathing (including washing, rinsing, drying)?: A Lot Help from another person to put on and taking off regular upper body clothing?: A Little Help from another person to put on and taking off regular lower body clothing?: A Lot 6 Click Score: 15   End of Session Equipment Utilized During Treatment: Gait belt;Rolling walker;Oxygen Nurse Communication: Mobility status  Activity Tolerance: Patient tolerated treatment well Patient left: in chair;with call bell/phone within reach;with chair alarm set;with nursing/sitter in room  OT Visit Diagnosis: Other abnormalities of gait  and mobility (R26.89);Muscle weakness (generalized) (M62.81);Other symptoms and signs involving cognitive function                Time: 1004-1053 OT Time Calculation (min): 49 min Charges:  OT General Charges $OT Visit: 1 Visit OT Evaluation $OT Eval Moderate Complexity: 1 Mod OT Treatments $Self Care/Home Management : 8-22 mins  Zenovia Jarred, MSOT, OTR/L Behavioral Health OT/ Acute Relief OT GVC: [phone]   Zenovia Jarred 01/28/2019, 1:45  PM

## 2019-01-29 ENCOUNTER — Inpatient Hospital Stay (HOSPITAL_COMMUNITY): Payer: Medicare Other

## 2019-01-29 DIAGNOSIS — R609 Edema, unspecified: Secondary | ICD-10-CM

## 2019-01-29 LAB — COMPREHENSIVE METABOLIC PANEL
ALT: 47 U/L — ABNORMAL HIGH (ref 0–44)
AST: 29 U/L (ref 15–41)
Albumin: 2.6 g/dL — ABNORMAL LOW (ref 3.5–5.0)
Alkaline Phosphatase: 62 U/L (ref 38–126)
Anion gap: 11 (ref 5–15)
BUN: 26 mg/dL — ABNORMAL HIGH (ref 8–23)
CO2: 24 mmol/L (ref 22–32)
Calcium: 8.1 mg/dL — ABNORMAL LOW (ref 8.9–10.3)
Chloride: 103 mmol/L (ref 98–111)
Creatinine, Ser: 1.1 mg/dL (ref 0.61–1.24)
GFR calc Af Amer: >60 mL/min (ref >60)
GFR calc non Af Amer: >60 mL/min (ref >60)
Glucose, Bld: 250 mg/dL — ABNORMAL HIGH (ref 70–99)
Potassium: 3.4 mmol/L — ABNORMAL LOW (ref 3.5–5.1)
Sodium: 138 mmol/L (ref 135–145)
Total Bilirubin: 1.2 mg/dL (ref 0.3–1.2)
Total Protein: 5.3 g/dL — ABNORMAL LOW (ref 6.5–8.1)

## 2019-01-29 LAB — D-DIMER, QUANTITATIVE: D-Dimer, Quant: 2.36 ug/mL-FEU — ABNORMAL HIGH (ref 0.00–0.50)

## 2019-01-29 LAB — C-REACTIVE PROTEIN: CRP: 15.3 mg/dL — ABNORMAL HIGH (ref <1.0)

## 2019-01-29 LAB — GLUCOSE, CAPILLARY
Glucose-Capillary: 227 mg/dL — ABNORMAL HIGH (ref 70–99)
Glucose-Capillary: 236 mg/dL — ABNORMAL HIGH (ref 70–99)
Glucose-Capillary: 85 mg/dL (ref 70–99)
Glucose-Capillary: 160 mg/dL — ABNORMAL HIGH (ref 70–99)

## 2019-01-29 LAB — FERRITIN: Ferritin: 689 ng/mL — ABNORMAL HIGH (ref 24–336)

## 2019-01-29 MED ORDER — POTASSIUM CHLORIDE CRYS ER 20 MEQ PO TBCR
40.0000 meq | EXTENDED_RELEASE_TABLET | Freq: Once | ORAL | Status: AC
  Administered 2019-01-29: 40 meq via ORAL
  Filled 2019-01-29: qty 2

## 2019-01-29 MED ORDER — INSULIN GLARGINE 100 UNIT/ML ~~LOC~~ SOLN
20.0000 [IU] | Freq: Every day | SUBCUTANEOUS | Status: DC
  Administered 2019-01-30 – 2019-01-31 (×2): 20 [IU] via SUBCUTANEOUS
  Filled 2019-01-29 (×2): qty 0.2

## 2019-01-29 MED ORDER — INSULIN GLARGINE 100 UNIT/ML ~~LOC~~ SOLN
10.0000 [IU] | Freq: Once | SUBCUTANEOUS | Status: AC
  Administered 2019-01-29: 10 [IU] via SUBCUTANEOUS
  Filled 2019-01-29: qty 0.1

## 2019-01-29 MED ORDER — FAMOTIDINE 20 MG PO TABS
20.0000 mg | ORAL_TABLET | Freq: Every day | ORAL | Status: DC
  Administered 2019-01-29 – 2019-02-03 (×6): 20 mg via ORAL
  Filled 2019-01-29 (×5): qty 1

## 2019-01-29 MED ORDER — DEXAMETHASONE SODIUM PHOSPHATE 10 MG/ML IJ SOLN
6.0000 mg | Freq: Two times a day (BID) | INTRAMUSCULAR | Status: DC
  Administered 2019-01-29 – 2019-02-03 (×11): 6 mg via INTRAVENOUS
  Filled 2019-01-29 (×9): qty 1

## 2019-01-29 NOTE — Progress Notes (Signed)
PROGRESS NOTE  Jesus [name] DOB: 1934-10-13 DOA: 01/22/2019  PCP: Jesus Reichmann, MD  Brief History/Interval Summary: 84 year old male who presented with dyspnea and altered mental status. He does have significant past medical history of type 2 diabetes mellitus, hypertension and dementia. Not feeling well for about 9 days, with decrease in cognitive function and generalized weakness, had one emergency department visit on September 28, eventually discharge home from the ED, October 5 tested positive for SARS COVID-19. At home he continued to have rapid decline in his cognitive function. Patient was admitted to the hospital working diagnosis of SARS COVID-19 viral pneumonia complicated by metabolic encephalopathy.  Reason for Visit: Acute respiratory failure with hypoxia.  Pneumonia due to COVID-19.  Consultants: None  Procedures: None  Antibiotics: Anti-infectives (From admission, onward)   Start     Dose/Rate Route Frequency Ordered Stop   01/26/19 1000  remdesivir 100 mg in sodium chloride 0.9 % 250 mL IVPB     100 mg 500 mL/hr over 30 Minutes Intravenous Every 24 hours 01/25/19 1216 01/29/19 0903   01/25/19 1400  remdesivir 200 mg in sodium chloride 0.9 % 250 mL IVPB     200 mg 500 mL/hr over 30 Minutes Intravenous Once 01/25/19 1216 01/25/19 1545      Subjective/Interval History: Patient not very communicative.  Very hard of hearing.  Denies any complaints.  Seems to be confused and distracted.    Assessment/Plan:  Acute Hypoxic Resp. Failure/Pneumonia due to COVID-19  COVID-19 Labs  Recent Labs    01/27/19 0410 01/28/19 0055 01/29/19 0120  DDIMER 0.89* 2.33* 2.36*  FERRITIN 501* 830* 689*  CRP 3.3* 8.5* 15.3*    Positive COVID-19 results available on care everywhere from 10/5.  Fever: Afebrile Oxygen requirements: On oxygen by nasal cannula at 3 L/min.  Saturating in the early 90s. Antibacterials: None Remdesivir: Day 5 today Steroids:  Dexamethasone 6 mg daily Diuretics: Not on diuretics on a scheduled basis Actemra: None given yet Vitamin C and Zinc: Continue DVT Prophylaxis:  Lovenox 40 mg daily  Patient's respiratory status seems to be stable.  He is requiring 3 L of oxygen by nasal cannula.  He remains on Remdesivir and steroids.  He will complete course of Remdesivir today.  He remains distracted presumably from his dementia.  He is apparently also very hard of hearing.  His inflammatory markers have been climbing for the last 2 days.  CRP is gone up from 3.3-15.3 today.  D-dimer 2.36.  Ferritin 689.  Could consider increasing the dose of his steroids.  He did undergo a CT scan of his chest yesterday which did not show any PE.  Progression of the pulmonary opacities was noted.  Lower extremity Doppler studies pending.  Prognosis is guarded at this time.  Continue Lovenox for DVT prophylaxis.  Acute metabolic encephalopathy in the setting of dementia Ental status seems to be stable.  He is confused and distracted.  He is on donepezil.  No obvious focal neurological deficits noted.  Diabetes mellitus type 2 uncontrolled with hyperglycemia Elevated CBGs most likely due to steroids.  HbA1c 6.7.  Continue SSI.  Continue Lantus as well.  Since we plan to increase the dose of steroids we will go up on the dose of Lantus at this time.  Essential hypertension Monitor blood pressures closely.  Continue amlodipine  Acute kidney injury normal anion gap metabolic acidosis/hypokalemia Creatinine apparently had worsened to 1.45 on 10/10.  Has improved since then.  Bicarbonate level is normal.  Noted to be on LR infusion which we will continue at a low rate due to his poor oral intake.  Replace potassium  Normocytic anemia No evidence of overt bleeding.  Continue to monitor.   DVT Prophylaxis: Lovenox PUD Prophylaxis: Initiate Pepcid Code Status: Full code Family Communication: We will try to reach out to his next of  kin Disposition Plan: Unclear for now.  Apparently he was at home with his brother   Medications:  Scheduled:  amLODipine  10 mg Oral Once per day on Sun Wed   aspirin  81 mg Oral Daily   calcium-vitamin D  1 tablet Oral Daily   clopidogrel  75 mg Oral Daily   dexamethasone (DECADRON) injection  6 mg Intravenous Q24H   donepezil  20 mg Oral QHS   enoxaparin (LOVENOX) injection  40 mg Subcutaneous Q24H   insulin aspart  0-15 Units Subcutaneous TID WC   insulin aspart  3 Units Subcutaneous TID WC   insulin glargine  10 Units Subcutaneous Daily   multivitamin  1 tablet Oral QHS   omega-3 acid ethyl esters  2 g Oral Daily   sodium chloride flush  3 mL Intravenous Q12H   tamsulosin  0.8 mg Oral QHS   vitamin C  500 mg Oral Daily   zinc sulfate  220 mg Oral Daily   Continuous:  sodium chloride     lactated ringers 75 mL/hr at 01/29/19 0552   EGB:TDVVOH chloride, acetaminophen, chlorpheniramine-HYDROcodone, guaiFENesin-dextromethorphan, ondansetron **OR** ondansetron (ZOFRAN) IV, sodium chloride flush   Objective:  Vital Signs  Vitals:   01/28/19 2000 01/29/19 0400 01/29/19 0745 01/29/19 0752  BP: (!) 141/75 (!) 152/72 140/61   Pulse: 62 77 67   Resp: 16 18 20    Temp: 98.3 F (36.8 C) 98.1 F (36.7 C) 98.9 F (37.2 C)   TempSrc: Oral Oral Oral   SpO2: 95% 92% 96% 95%  Weight:      Height:       No intake or output data in the 24 hours ending 01/29/19 1110 Filed Weights   01/22/19 2048  Weight: 71.1 kg    General appearance: Awake alert.  In no distress.  Distracted Resp: Mildly tachypneic at rest.  Coarse breath sounds bilaterally with crackles at the bases.  No wheezing or rhonchi.   Cardio: S1-S2 is normal regular.  No S3-S4.  No rubs murmurs or bruit GI: Abdomen is soft.  Nontender nondistended.  Bowel sounds are present normal.  No masses organomegaly Extremities: No edema.   Neurologic: Awake alert.  Distracted.  No focal neurological  deficits.    Lab Results:  Data Reviewed: I have personally reviewed following labs and imaging studies  CBC: Recent Labs  Lab 01/23/19 0105 01/24/19 0055 01/25/19 0354 01/26/19 0134 01/27/19 0410  WBC 4.9 6.6 6.1 7.6 6.7  NEUTROABS 4.3 5.7 5.3 6.8 5.7  HGB 11.1* 11.9* 10.8* 9.8* 10.4*  HCT 33.2* 35.3* 31.8* 29.2* 30.3*  MCV 90.7 89.8 90.3 89.8 88.3  PLT 123* 143* 149* 162 607    Basic Metabolic Panel: Recent Labs  Lab 01/25/19 0354 01/26/19 0134 01/27/19 0410 01/28/19 0055 01/29/19 0120  NA 137 139 139 134* 138  K 4.7 3.9 3.8 3.9 3.4*  CL 103 104 104 99 103  CO2 23 25 25 22 24   GLUCOSE 283* 177* 111* 362* 250*  BUN 49* 37* 34* 35* 26*  CREATININE 1.45* 1.10 0.98 1.22 1.10  CALCIUM 8.6* 8.3* 8.2* 8.0* 8.1*    GFR:  Estimated Creatinine Clearance: 48.4 mL/min (by C-G formula based on SCr of 1.1 mg/dL).  Liver Function Tests: Recent Labs  Lab 01/25/19 0354 01/26/19 0134 01/27/19 0410 01/28/19 0055 01/29/19 0120  AST 106* 29  ALT 81* 47*  ALKPHOS 63 55 52 78 62  BILITOT 0.8 1.0 1.0 0.8 1.2  PROT 6.2* 5.5* 5.4* 5.2* 5.3*  ALBUMIN 3.3* 3.0* 2.8* 2.6* 2.6*     CBG: Recent Labs  Lab 01/27/19 2114 01/28/19 0810 01/28/19 1053 01/28/19 1624 01/29/19 0743  GLUCAP 279* 284* 276* 284* 227*     Anemia Panel: Recent Labs    01/28/19 0055 01/29/19 0120  FERRITIN 830* 689*    Recent Results (from the past 240 hour(s))  Blood Culture (routine x 2)     Status: None   Collection Time: 01/22/19 12:24 PM   Specimen: BLOOD  Result Value Ref Range Status   Specimen Description BLOOD BLOOD LEFT HAND  Final   Special Requests   Final    BOTTLES DRAWN AEROBIC AND ANAEROBIC Blood Culture adequate volume   Culture   Final    NO GROWTH 5 DAYS Performed at [facility], [address] [place], [place] [address]    Report Status 01/27/2019 FINAL  Final  Blood Culture (routine x 2)     Status: None   Collection Time: 01/22/19  12:24 PM   Specimen: BLOOD  Result Value Ref Range Status   Specimen Description BLOOD BLOOD RIGHT HAND  Final   Special Requests   Final    BOTTLES DRAWN AEROBIC AND ANAEROBIC Blood Culture adequate volume   Culture   Final    NO GROWTH 5 DAYS Performed at [facility], [address] [place], [place] [address]    Report Status 01/27/2019 FINAL  Final      Radiology Studies: Ct Angio Chest Pe W Or Wo Contrast  Result Date: 01/28/2019 CLINICAL DATA:  84 year old male with concern for pulmonary embolism. Positive D-dimer. COVID-19 positive. EXAM: CT ANGIOGRAPHY CHEST WITH CONTRAST TECHNIQUE: Multidetector CT imaging of the chest was performed using the standard protocol during bolus administration of intravenous contrast. Multiplanar CT image reconstructions and MIPs were obtained to evaluate the vascular anatomy. CONTRAST:  OMNIPAQUE IOHEXOL 350 MG/ML SOLN COMPARISON:  Chest radiograph dated 01/22/2019. FINDINGS: Cardiovascular: There is mild cardiomegaly. No pericardial effusion. Multi vessel coronary vascular calcification with involvement of the LAD, RCA, and left circumflex artery. Moderate atherosclerotic calcification of the thoracic aorta. No aneurysmal dilatation or dissection. There is diminutive appearance of the left vertebral artery. The origins of the remainder of the visualized great vessels of the aortic arch appear patent. Evaluation of the pulmonary arteries is somewhat limited due to respiratory motion artifact and suboptimal opacification and visualization of the peripheral branches. No pulmonary artery embolus identified. Mediastinum/Nodes: There is no hilar or mediastinal adenopathy. The esophagus is grossly unremarkable. Several small bilateral hypodense thyroid nodules noted. Ultrasound may provide better evaluation. No mediastinal fluid collection. Lungs/Pleura: Bilateral patchy airspace opacities noted. Overall interval progression of the airspace opacities  compared to the radiograph of 01/22/2019 when accounting for difference in technique. There are a spectrum of findings in the lungs which can be seen with acute atypical infection (as well as other non-infectious etiologies). In particular, viral pneumonia (including COVID-19) should be considered in the appropriate clinical setting. Small bilateral pleural effusions noted. There is no pneumothorax. The central airways are patent. Upper Abdomen: No acute abnormality. Musculoskeletal: Osteopenia  with degenerative changes of the spine. No acute osseous pathology. Left pectoral pacemaker device noted. Review of the MIP images confirms the above findings. IMPRESSION: 1. No CT evidence of pulmonary embolism. 2. Bilateral patchy airspace opacities as well as small bilateral pleural effusions. Overall interval progression of the airspace opacities compared to the radiograph of 01/22/2019 when accounting for difference in technique. There are a spectrum of findings in the lungs which can be seen with acute atypical infection (as well as other non-infectious etiologies). In particular, viral pneumonia (including COVID) should be considered in the appropriate clinical setting. Aortic Atherosclerosis (ICD10-I70.0). Electronically Signed   By: Elgie CollardArash  Radparvar M.D.   On: 01/28/2019 17:11       LOS: 7 days   Marisha Renier [employer]ockerKrishnan  [facility] Pager on [email or web address]  01/29/2019, 11:10 AM

## 2019-01-30 LAB — BASIC METABOLIC PANEL
Anion gap: 10 (ref 5–15)
BUN: 29 mg/dL — ABNORMAL HIGH (ref 8–23)
CO2: 23 mmol/L (ref 22–32)
Calcium: 8.1 mg/dL — ABNORMAL LOW (ref 8.9–10.3)
Chloride: 104 mmol/L (ref 98–111)
Creatinine, Ser: 1.14 mg/dL (ref 0.61–1.24)
GFR calc Af Amer: >60 mL/min (ref >60)
GFR calc non Af Amer: 60 mL/min — ABNORMAL LOW (ref >60)
Glucose, Bld: 184 mg/dL — ABNORMAL HIGH (ref 70–99)
Potassium: 3.2 mmol/L — ABNORMAL LOW (ref 3.5–5.1)
Sodium: 137 mmol/L (ref 135–145)

## 2019-01-30 LAB — C-REACTIVE PROTEIN: CRP: 19 mg/dL — ABNORMAL HIGH (ref <1.0)

## 2019-01-30 LAB — D-DIMER, QUANTITATIVE: D-Dimer, Quant: 5.19 ug/mL-FEU — ABNORMAL HIGH (ref 0.00–0.50)

## 2019-01-30 LAB — GLUCOSE, CAPILLARY
Glucose-Capillary: 230 mg/dL — ABNORMAL HIGH (ref 70–99)
Glucose-Capillary: 275 mg/dL — ABNORMAL HIGH (ref 70–99)

## 2019-01-30 LAB — MAGNESIUM: Magnesium: 2 mg/dL (ref 1.7–2.4)

## 2019-01-30 MED ORDER — POTASSIUM CHLORIDE 20 MEQ/15ML (10%) PO SOLN
40.0000 meq | Freq: Once | ORAL | Status: AC
  Administered 2019-01-30: 40 meq via ORAL
  Filled 2019-01-30: qty 30

## 2019-01-30 MED ORDER — POTASSIUM CHLORIDE CRYS ER 20 MEQ PO TBCR
40.0000 meq | EXTENDED_RELEASE_TABLET | ORAL | Status: DC
  Administered 2019-01-30: 40 meq via ORAL
  Filled 2019-01-30: qty 2

## 2019-01-30 MED ORDER — ENOXAPARIN SODIUM 40 MG/0.4ML ~~LOC~~ SOLN
40.0000 mg | Freq: Two times a day (BID) | SUBCUTANEOUS | Status: DC
  Administered 2019-01-30 – 2019-02-05 (×13): 40 mg via SUBCUTANEOUS
  Filled 2019-01-30 (×13): qty 0.4

## 2019-01-30 MED ORDER — POTASSIUM CHLORIDE 20 MEQ/15ML (10%) PO SOLN: 40.0000 meq | Freq: Once | ORAL | Status: DC

## 2019-01-30 NOTE — Progress Notes (Signed)
Physical Therapy Treatment Patient Details Name: Jesus [name] MRN: [id number] DOB: Aug 26, 1934 Today's Date: 01/30/2019    History of Present Illness 84 y/o male w/ hx of dementia, DM, HTN, brought to hospital by family after +COVID test and increasing weakness.    PT Comments     Pt does fair with mobility but saturation decreased drastically with activity. With supine to sit, mod a, pt desat to 78% on 2L/min via [place] measured on finger probe. Sat EOB some time and was able to recover to high 80s, sit<>stand and few steps bed to recliner with mod a and HHA, once seated pt dest to 65%, needed increased time and also increase to 4L/min to recover to 86%. Pt still confused and lethargic during this tx session.     Follow Up Recommendations  Supervision/Assistance - 24 hour     Equipment Recommendations  Rolling walker with 5" wheels    Recommendations for Other Services       Precautions / Restrictions Precautions Precautions: Fall Precaution Comments: HOH, watch sats Restrictions Weight Bearing Restrictions: No    Mobility  Bed Mobility Overal bed mobility: Needs Assistance Bed Mobility: Supine to Sit     Supine to sit: Min assist        Transfers Overall transfer level: Needs assistance Equipment used: 1 person hand held assist Transfers: Sit to/from Stand Sit to Stand: Mod assist         General transfer comment: needed verbal and tactile cues to complete sit<>stand at EOB and take few steps to chair at bedside  Ambulation/Gait             General Gait Details: only took few steps from bed to recliner   Stairs             Wheelchair Mobility    Modified Rankin (Stroke Patients Only)       Balance Overall balance assessment: Needs assistance Sitting-balance support: Feet supported Sitting balance-Leahy Scale: Fair     Standing balance support: Single extremity supported Standing balance-Leahy Scale: Poor                               Cognition Arousal/Alertness: Lethargic Behavior During Therapy: Flat affect Overall Cognitive Status: History of cognitive impairments - at baseline                                        Exercises      General Comments General comments (skin integrity, edema, etc.): Pt seems to have better hearing today. was able to complete bed mob and sit EOB, dest to 78% with this activity. Pt on 2L/ min via [place] measured on finger probe, with increased cues for pursed lip breathing was able to regain sats to 88% and was agreeable to moving to recliner, completed this w/ mod A and cues and HHA. Once seated in recliner desat to 65%, within 2 mins unable to regain stable reading moved probe from one hand to the other and sats still in 70s, increased 02 to 4L/min via [place] and able to increase saturation to 86%.      Pertinent Vitals/Pain Pain Assessment: No/denies pain    Home Living                      Prior Function  PT Goals (current goals can now be found in the care plan section) Acute Rehab PT Goals Patient Stated Goal: none Time For Goal Achievement: 02/07/19 Potential to Achieve Goals: Fair Progress towards PT goals: Progressing toward goals    Frequency    Min 2X/week      PT Plan Current plan remains appropriate    Co-evaluation              AM-PAC PT "6 Clicks" Mobility   Outcome Measure  Help needed turning from your back to your side while in a flat bed without using bedrails?: A Lot Help needed moving from lying on your back to sitting on the side of a flat bed without using bedrails?: A Lot Help needed moving to and from a bed to a chair (including a wheelchair)?: A Lot Help needed standing up from a chair using your arms (e.g., wheelchair or bedside chair)?: A Lot Help needed to walk in hospital room?: A Lot Help needed climbing 3-5 steps with a railing? : Total 6 Click Score: 11    End of Session Equipment  Utilized During Treatment: Oxygen Activity Tolerance: Treatment limited secondary to medical complications (Comment) Patient left: in chair;with call bell/phone within reach;with chair alarm set Nurse Communication: Mobility status;Precautions PT Visit Diagnosis: Muscle weakness (generalized) (M62.81);Other abnormalities of gait and mobility (R26.89)     Time: 3491-7915 PT Time Calculation (min) (ACUTE ONLY): 28 min  Charges:  $Therapeutic Activity: 8-22 mins $Self Care/Home Management: 8-22                     Horald Chestnut, PT     Delford Field 01/30/2019, 2:29 PM

## 2019-01-30 NOTE — Progress Notes (Signed)
Lost Iv access. Attemp x1 and unsuccessful. 2nd RN to attempt and unsuccessful. IV team consult ordered. MD aware

## 2019-01-30 NOTE — Progress Notes (Signed)
Legal Guardian called and updated, reviewed medications and explained each med family had questions about. No other questions at this time.

## 2019-01-30 NOTE — Progress Notes (Signed)
PROGRESS NOTE  Jesus [name] RUE:[id number] DOB: 1934-04-27 DOA: 01/22/2019  PCP: Barbette Reichmann, MD  Brief History/Interval Summary: 84 year old male who presented with dyspnea and altered mental status. He does have significant past medical history of type 2 diabetes mellitus, hypertension and dementia. Not feeling well for about 9 days, with decrease in cognitive function and generalized weakness, had one emergency department visit on September 28, eventually discharge home from the ED, October 5 tested positive for SARS COVID-19. At home he continued to have rapid decline in his cognitive function. Patient was admitted to the hospital working diagnosis of SARS COVID-19 viral pneumonia complicated by metabolic encephalopathy.  Reason for Visit: Acute respiratory failure with hypoxia.  Pneumonia due to COVID-19.  Consultants: None  Procedures: None  Antibiotics: Anti-infectives (From admission, onward)   Start     Dose/Rate Route Frequency Ordered Stop   01/26/19 1000  remdesivir 100 mg in sodium chloride 0.9 % 250 mL IVPB     100 mg 500 mL/hr over 30 Minutes Intravenous Every 24 hours 01/25/19 1216 01/29/19 0903   01/25/19 1400  remdesivir 200 mg in sodium chloride 0.9 % 250 mL IVPB     200 mg 500 mL/hr over 30 Minutes Intravenous Once 01/25/19 1216 01/25/19 1545      Subjective/Interval History: Patient not very communicative.  Denies any pain or shortness of breath this morning.  Distracted.     Assessment/Plan:  Acute Hypoxic Resp. Failure/Pneumonia due to COVID-19  COVID-19 Labs  Recent Labs    01/28/19 0055 01/29/19 0120 01/30/19 0115  DDIMER 2.33* 2.36* 5.19*  FERRITIN 830* 689*  --   CRP 8.5* 15.3* 19.0*    Positive COVID-19 results available on care everywhere from 10/5.  Fever: Afebrile Oxygen requirements: Nasal cannula.  2 L/min.  Saturating in the early 90s.   Antibacterials: None Remdesivir: Completed a 5-day course on 10/14 Steroids:  Dexamethasone 6 mg twice daily Diuretics: Not on diuretics on a scheduled basis Actemra: None given yet Vitamin C and Zinc: Continue DVT Prophylaxis:  Lovenox 40 mg daily  Patient's respiratory status seems to have stabilized.  He is requiring 2 L of oxygen by nasal cannula.  He is completed a course of Remdesivir.  His CRP however continues to rise.  Dose of dexamethasone was increased yesterday.  D-dimer also noted to be greater than 5 today.  CT angiogram done 2 days ago did not show any PE.  Progression of pulmonary opacities were noted.  Lower extremity Doppler studies were negative for DVT.  Increase the dose of Lovenox to twice daily for now.  Continue to trend d-dimer and inflammatory markers.  Continue to monitor closely.  Acute metabolic encephalopathy in the setting of dementia His mental status seems to be stable.  He remains confused and distracted which could just be due to his baseline dementia.  No behavioral disturbances noted.  Continue donepezil.    Diabetes mellitus type 2 uncontrolled with hyperglycemia Elevated CBGs most likely due to steroids.  HbA1c 6.7.  Continue SSI.  Lantus dose was increased yesterday.  Continue to adjust based on CBGs.    Essential hypertension Occasional high readings noted.  Most likely due to steroids. Continue amlodipine.    Acute kidney injury normal anion gap metabolic acidosis/hypokalemia Creatinine apparently had worsened to 1.45 on 10/10.  Has improved since then.  Renal function remains at baseline.  Normal bicarbonate levels.  Continue lactated Ringer's at low rate for now.  Potassium to be replaced.  Magnesium 2.0.  Normocytic anemia No evidence of overt bleeding.  Continue to monitor.   DVT Prophylaxis: Lovenox PUD Prophylaxis: Pepcid Code Status: Full code Family Communication: Discussed with his brother yesterday Disposition Plan: Unclear for now.  Apparently he was at home with his brother   Medications:  Scheduled:   amLODipine  10 mg Oral Once per day on Sun Wed   aspirin  81 mg Oral Daily   calcium-vitamin D  1 tablet Oral Daily   clopidogrel  75 mg Oral Daily   dexamethasone (DECADRON) injection  6 mg Intravenous Q12H   donepezil  20 mg Oral QHS   enoxaparin (LOVENOX) injection  40 mg Subcutaneous Q12H   famotidine  20 mg Oral Daily   insulin aspart  0-15 Units Subcutaneous TID WC   insulin aspart  3 Units Subcutaneous TID WC   insulin glargine  20 Units Subcutaneous Daily   multivitamin  1 tablet Oral QHS   omega-3 acid ethyl esters  2 g Oral Daily   potassium chloride  40 mEq Oral Q4H   sodium chloride flush  3 mL Intravenous Q12H   tamsulosin  0.8 mg Oral QHS   vitamin C  500 mg Oral Daily   zinc sulfate  220 mg Oral Daily   Continuous:  sodium chloride     lactated ringers 50 mL/hr at 01/29/19 1202   WUJ:WJXBJY chloride, acetaminophen, chlorpheniramine-HYDROcodone, guaiFENesin-dextromethorphan, ondansetron **OR** ondansetron (ZOFRAN) IV, sodium chloride flush   Objective:  Vital Signs  Vitals:   01/29/19 2031 01/30/19 0400 01/30/19 0807 01/30/19 0810  BP: 140/60 (!) 155/60 (!) 154/61   Pulse: 69 70 64   Resp: Temp: 98.6 F (37 C) 98.4 F (36.9 C) 97.6 F (36.4 C)   TempSrc: Oral Oral Oral   SpO2: 94% 90% (S) (!) 88% 91%  Weight:      Height:        Intake/Output Summary (Last 24 hours) at 01/30/2019 1026 Last data filed at 01/30/2019 0555 Gross per 24 hour  Intake 2146.04 ml  Output 575 ml  Net 1571.04 ml   Filed Weights   01/22/19 2048  Weight: 71.1 kg    General appearance: Awake alert.  In no distress.  Distracted. Resp: Normal effort at rest.  Coarse breath sounds bilaterally.  Few crackles at the bases.  No wheezing or rhonchi.   Cardio: S1-S2 is normal regular.  No S3-S4.  No rubs murmurs or bruit GI: Abdomen is soft.  Nontender nondistended.  Bowel sounds are present normal.  No masses organomegaly Extremities: No edema.   Noted to move his extremities Neurologic: He is awake alert.  Responds to some questions.  Disoriented.  No obvious focal deficits noted.      Lab Results:  Data Reviewed: I have personally reviewed following labs and imaging studies  CBC: Recent Labs  Lab 01/24/19 0055 01/25/19 0354 01/26/19 0134 01/27/19 0410  WBC 6.6 6.1 7.6 6.7  NEUTROABS 5.7 5.3 6.8 5.7  HGB 11.9* 10.8* 9.8* 10.4*  HCT 35.3* 31.8* 29.2* 30.3*  MCV 89.8 90.3 89.8 88.3  PLT 143* 149* 162 178    Basic Metabolic Panel: Recent Labs  Lab 01/26/19 0134 01/27/19 0410 01/28/19 0055 01/29/19 0120 01/30/19 0115  NA 139 139 134* 138 137  K 3.9 3.8 3.9 3.4* 3.2*  CL 104 104 99 103 104  CO2 GLUCOSE 177* 111* 362* 250* 184*  BUN 37* 34* 35* 26* 29*  CREATININE  1.10 0.98 1.22 1.10 1.14  CALCIUM 8.3* 8.2* 8.0* 8.1* 8.1*  MG  --   --   --   --  2.0    GFR: Estimated Creatinine Clearance: 46.7 mL/min (by C-G formula based on SCr of 1.14 mg/dL).  Liver Function Tests: Recent Labs  Lab 01/25/19 0354 01/26/19 0134 01/27/19 0410 01/28/19 0055 01/29/19 0120  AST 24 21 21  106* 29  ALT 22 18 18  81* 47*  ALKPHOS 63 55 52 78 62  BILITOT 0.8 1.0 1.0 0.8 1.2  PROT 6.2* 5.5* 5.4* 5.2* 5.3*  ALBUMIN 3.3* 3.0* 2.8* 2.6* 2.6*     CBG: Recent Labs  Lab 01/29/19 0743 01/29/19 1143 01/29/19 1639 01/29/19 1944 01/30/19 0750  GLUCAP 227* 236* 85 160* 230*     Anemia Panel: Recent Labs    01/28/19 0055 01/29/19 0120  FERRITIN 830* 689*    Recent Results (from the past 240 hour(s))  Blood Culture (routine x 2)     Status: None   Collection Time: 01/22/19 12:24 PM   Specimen: BLOOD  Result Value Ref Range Status   Specimen Description BLOOD BLOOD LEFT HAND  Final   Special Requests   Final    BOTTLES DRAWN AEROBIC AND ANAEROBIC Blood Culture adequate volume   Culture   Final    NO GROWTH 5 DAYS Performed at [facility], [address] [place], [place] [address]     Report Status 01/27/2019 FINAL  Final  Blood Culture (routine x 2)     Status: None   Collection Time: 01/22/19 12:24 PM   Specimen: BLOOD  Result Value Ref Range Status   Specimen Description BLOOD BLOOD RIGHT HAND  Final   Special Requests   Final    BOTTLES DRAWN AEROBIC AND ANAEROBIC Blood Culture adequate volume   Culture   Final    NO GROWTH 5 DAYS Performed at [facility], [address] [place], [place] [address]    Report Status 01/27/2019 FINAL  Final      Radiology Studies: Ct Angio Chest Pe W Or Wo Contrast  Result Date: 01/28/2019 CLINICAL DATA:  84 year old male with concern for pulmonary embolism. Positive D-dimer. COVID-19 positive. EXAM: CT ANGIOGRAPHY CHEST WITH CONTRAST TECHNIQUE: Multidetector CT imaging of the chest was performed using the standard protocol during bolus administration of intravenous contrast. Multiplanar CT image reconstructions and MIPs were obtained to evaluate the vascular anatomy. CONTRAST:  118mL OMNIPAQUE IOHEXOL 350 MG/ML SOLN COMPARISON:  Chest radiograph dated 01/22/2019. FINDINGS: Cardiovascular: There is mild cardiomegaly. No pericardial effusion. Multi vessel coronary vascular calcification with involvement of the LAD, RCA, and left circumflex artery. Moderate atherosclerotic calcification of the thoracic aorta. No aneurysmal dilatation or dissection. There is diminutive appearance of the left vertebral artery. The origins of the remainder of the visualized great vessels of the aortic arch appear patent. Evaluation of the pulmonary arteries is somewhat limited due to respiratory motion artifact and suboptimal opacification and visualization of the peripheral branches. No pulmonary artery embolus identified. Mediastinum/Nodes: There is no hilar or mediastinal adenopathy. The esophagus is grossly unremarkable. Several small bilateral hypodense thyroid nodules noted. Ultrasound may provide better evaluation. No mediastinal fluid  collection. Lungs/Pleura: Bilateral patchy airspace opacities noted. Overall interval progression of the airspace opacities compared to the radiograph of 01/22/2019 when accounting for difference in technique. There are a spectrum of findings in the lungs which can be seen with acute atypical infection (as well as other non-infectious etiologies). In particular, viral pneumonia (  including COVID-19) should be considered in the appropriate clinical setting. Small bilateral pleural effusions noted. There is no pneumothorax. The central airways are patent. Upper Abdomen: No acute abnormality. Musculoskeletal: Osteopenia with degenerative changes of the spine. No acute osseous pathology. Left pectoral pacemaker device noted. Review of the MIP images confirms the above findings. IMPRESSION: 1. No CT evidence of pulmonary embolism. 2. Bilateral patchy airspace opacities as well as small bilateral pleural effusions. Overall interval progression of the airspace opacities compared to the radiograph of 01/22/2019 when accounting for difference in technique. There are a spectrum of findings in the lungs which can be seen with acute atypical infection (as well as other non-infectious etiologies). In particular, viral pneumonia (including COVID) should be considered in the appropriate clinical setting. Aortic Atherosclerosis (ICD10-I70.0). Electronically Signed   By: Elgie CollardArash  Radparvar M.D.   On: 01/28/2019 17:11   Vas [place] Lower Extremity Venous (dvt)  Result Date: 01/29/2019  Lower Venous Study Indications: Edema. Other Indications: COVID. Anticoagulation: Enoxaparin. Performing Technologist: Leta Junglingiffany Cooper RDCS  Examination Guidelines: A complete evaluation includes B-mode imaging, spectral Doppler, color Doppler, and power Doppler as needed of all accessible portions of each vessel. Bilateral testing is considered an integral part of a complete examination. Limited examinations for reoccurring indications may be performed as  noted.  +---------+---------------+---------+-----------+----------+--------------+  RIGHT     Compressibility Phasicity Spontaneity Properties Thrombus Aging  +---------+---------------+---------+-----------+----------+--------------+  CFV       Full            Yes       Yes                                    +---------+---------------+---------+-----------+----------+--------------+  FV Prox   Full                                                             +---------+---------------+---------+-----------+----------+--------------+  FV Mid    Full                                                             +---------+---------------+---------+-----------+----------+--------------+  FV Distal Full                                                             +---------+---------------+---------+-----------+----------+--------------+  POP                                                        Not visualized  +---------+---------------+---------+-----------+----------+--------------+  PTV  Not visualized  +---------+---------------+---------+-----------+----------+--------------+  PERO                                                       Not visualized  +---------+---------------+---------+-----------+----------+--------------+   +---------+---------------+---------+-----------+----------+-------------------+  LEFT      Compressibility Phasicity Spontaneity Properties Thrombus Aging       +---------+---------------+---------+-----------+----------+-------------------+  CFV                       Yes       Yes                    unable to fully                                                                  compress. Patient                                                                flow visualized                                                                  with color and                                                                   spectral doppler      +---------+---------------+---------+-----------+----------+-------------------+  FV Prox   Full                                                                  +---------+---------------+---------+-----------+----------+-------------------+  FV Mid    Full                                                                  +---------+---------------+---------+-----------+----------+-------------------+  FV Distal Full                                                                  +---------+---------------+---------+-----------+----------+-------------------+  POP       Full            Yes       Yes                                         +---------+---------------+---------+-----------+----------+-------------------+  PTV                                                        Not visualized       +---------+---------------+---------+-----------+----------+-------------------+  PERO                                                       Not visualized       +---------+---------------+---------+-----------+----------+-------------------+     Summary: Right: There is no evidence of deep vein thrombosis in the lower extremity. However, portions of this examination were limited- see technologist comments above. Very difficult study du to patients altered mental status. Unable to visualize Pop V, PTV, and Peroneal V. Left: There is no evidence of deep vein thrombosis in the lower extremity. However, portions of this examination were limited- see technologist comments above. No cystic structure found in the popliteal fossa. Unable to visualize the PTV and Pero V.  *See table(s) above for measurements and observations. Electronically signed by Gretta Began MD on 01/29/2019 at 3:06:59 PM.    Final        LOS: 8 days   Osvaldo Shipper  [facility] Pager on [email or web address]  01/30/2019, 10:26 AM

## 2019-01-31 ENCOUNTER — Inpatient Hospital Stay (HOSPITAL_COMMUNITY): Payer: Medicare Other

## 2019-01-31 LAB — D-DIMER, QUANTITATIVE: D-Dimer, Quant: 2.38 ug/mL-FEU — ABNORMAL HIGH (ref 0.00–0.50)

## 2019-01-31 LAB — CBC
HCT: 29.4 % — ABNORMAL LOW (ref 39.0–52.0)
Hemoglobin: 10.1 g/dL — ABNORMAL LOW (ref 13.0–17.0)
MCH: 30.2 pg (ref 26.0–34.0)
MCHC: 34.4 g/dL (ref 30.0–36.0)
MCV: 88 fL (ref 80.0–100.0)
Platelets: 254 10*3/uL (ref 150–400)
RBC: 3.34 MIL/uL — ABNORMAL LOW (ref 4.22–5.81)
RDW: 13.3 % (ref 11.5–15.5)
WBC: 11.3 10*3/uL — ABNORMAL HIGH (ref 4.0–10.5)
nRBC: 0 % (ref 0.0–0.2)

## 2019-01-31 LAB — BASIC METABOLIC PANEL
Anion gap: 8 (ref 5–15)
BUN: 33 mg/dL — ABNORMAL HIGH (ref 8–23)
CO2: 23 mmol/L (ref 22–32)
Calcium: 8 mg/dL — ABNORMAL LOW (ref 8.9–10.3)
Chloride: 106 mmol/L (ref 98–111)
Creatinine, Ser: 1.09 mg/dL (ref 0.61–1.24)
GFR calc Af Amer: >60 mL/min (ref >60)
GFR calc non Af Amer: >60 mL/min (ref >60)
Glucose, Bld: 226 mg/dL — ABNORMAL HIGH (ref 70–99)
Potassium: 3.9 mmol/L (ref 3.5–5.1)
Sodium: 137 mmol/L (ref 135–145)

## 2019-01-31 LAB — GLUCOSE, CAPILLARY
Glucose-Capillary: 383 mg/dL — ABNORMAL HIGH (ref 70–99)
Glucose-Capillary: 255 mg/dL — ABNORMAL HIGH (ref 70–99)
Glucose-Capillary: 349 mg/dL — ABNORMAL HIGH (ref 70–99)
Glucose-Capillary: 272 mg/dL — ABNORMAL HIGH (ref 70–99)
Glucose-Capillary: 192 mg/dL — ABNORMAL HIGH (ref 70–99)

## 2019-01-31 LAB — C-REACTIVE PROTEIN: CRP: 15.3 mg/dL — ABNORMAL HIGH (ref <1.0)

## 2019-01-31 MED ORDER — SODIUM CHLORIDE 0.9% FLUSH
10.0000 mL | INTRAVENOUS | Status: DC | PRN
  Filled 2019-01-31: qty 40

## 2019-01-31 MED ORDER — INSULIN GLARGINE 100 UNIT/ML ~~LOC~~ SOLN
5.0000 [IU] | Freq: Once | SUBCUTANEOUS | Status: AC
  Administered 2019-01-31: 5 [IU] via SUBCUTANEOUS
  Filled 2019-01-31: qty 0.05

## 2019-01-31 MED ORDER — FUROSEMIDE 10 MG/ML IJ SOLN
40.0000 mg | Freq: Once | INTRAMUSCULAR | Status: AC
  Administered 2019-01-31: 40 mg via INTRAVENOUS
  Filled 2019-01-31: qty 4

## 2019-01-31 MED ORDER — RESOURCE THICKENUP CLEAR PO POWD
ORAL | Status: DC | PRN
  Filled 2019-01-31 (×2): qty 125

## 2019-01-31 MED ORDER — INSULIN ASPART 100 UNIT/ML ~~LOC~~ SOLN
6.0000 [IU] | Freq: Three times a day (TID) | SUBCUTANEOUS | Status: DC
  Administered 2019-01-31 – 2019-02-05 (×13): 6 [IU] via SUBCUTANEOUS

## 2019-01-31 MED ORDER — INSULIN GLARGINE 100 UNIT/ML ~~LOC~~ SOLN
24.0000 [IU] | Freq: Every day | SUBCUTANEOUS | Status: DC
  Filled 2019-01-31: qty 0.24

## 2019-01-31 MED ORDER — SODIUM CHLORIDE 0.9% FLUSH
10.0000 mL | Freq: Two times a day (BID) | INTRAVENOUS | Status: DC
  Administered 2019-01-31 – 2019-02-05 (×11): 10 mL

## 2019-01-31 NOTE — Progress Notes (Signed)
MD notified of pt increased in oxygen needs

## 2019-01-31 NOTE — Progress Notes (Signed)
Legal Guardian called. He is updated and all questions answered

## 2019-01-31 NOTE — Progress Notes (Addendum)
PROGRESS NOTE  Jesus [name] ZOX:[id number] DOB: 1934/08/31 DOA: 01/22/2019  PCP: Barbette Reichmann, MD  Brief History/Interval Summary: 84 year old male who presented with dyspnea and altered mental status. He does have significant past medical history of type 2 diabetes mellitus, hypertension and dementia. Not feeling well for about 9 days, with decrease in cognitive function and generalized weakness, had one emergency department visit on September 28, eventually discharge home from the ED, October 5 tested positive for SARS COVID-19. At home he continued to have rapid decline in his cognitive function. Patient was admitted to the hospital working diagnosis of SARS COVID-19 viral pneumonia complicated by metabolic encephalopathy.  Reason for Visit: Acute respiratory failure with hypoxia.  Pneumonia due to COVID-19.  Consultants: None  Procedures: None  Antibiotics: Anti-infectives (From admission, onward)   Start     Dose/Rate Route Frequency Ordered Stop   01/26/19 1000  remdesivir 100 mg in sodium chloride 0.9 % 250 mL IVPB     100 mg 500 mL/hr over 30 Minutes Intravenous Every 24 hours 01/25/19 1216 01/29/19 0903   01/25/19 1400  remdesivir 200 mg in sodium chloride 0.9 % 250 mL IVPB     200 mg 500 mL/hr over 30 Minutes Intravenous Once 01/25/19 1216 01/25/19 1545      Subjective/Interval History: Overnight patient is oxygen requirements have gone up.  He is on 6 L this morning by high flow.  Discussed with nursing staff.  Patient denies any shortness of breath or chest pain.  He has been noted to cough when he eats and drinks.  His appetite is very poor.     Assessment/Plan:  Acute Hypoxic Resp. Failure/Pneumonia due to COVID-19  COVID-19 Labs  Recent Labs    01/29/19 0120 01/30/19 0115 01/31/19 0037  DDIMER 2.36* 5.19* 2.38*  FERRITIN 689*  --   --   CRP 15.3* 19.0* 15.3*    Positive COVID-19 results available on care everywhere from 10/5.  Fever: Noted to  be afebrile Oxygen requirements: High flow nasal cannula at 6 L saturating in the early 90s.   Antibacterials: None Remdesivir: Completed a 5-day course on 10/14 Steroids: Dexamethasone 6 mg twice daily Diuretics: Not on diuretics on a scheduled basis Actemra: None given yet Vitamin C and Zinc: Continue DVT Prophylaxis:  Lovenox 40 mg twice daily  Patient noted to have higher oxygen requirements this morning.  Reason for this is not entirely clear.  We will proceed with a chest x-ray.  It is quite possible he may have aspirated.  CRP had increased yesterday.  His dose of dexamethasone was increased 2 days ago.  D-dimer was also noted to be high yesterday.  However these parameters are better today.  Follow-up on chest x-ray.  CT angiogram of the chest done 3 days ago did not show any PE.  Lower extremity Doppler studies negative for DVT.  Continue to monitor closely.  Speech therapy consult for swallow evaluation.  Acute metabolic encephalopathy in the setting of dementia Mental status is stable without any changes over the last 48 hours.  He remains confused and distracted which could just be due to his baseline dementia.  No behavioral disturbances noted.  Continue donepezil.    Diabetes mellitus type 2 uncontrolled with hyperglycemia Is most likely due to steroids.  HbA1c 6.7.  Continue SSI.  Lantus dose was increased day before yesterday.  May need to go up further on the dose of Lantus.    Essential hypertension Occasional high readings noted.  Continue  amlodipine.  High readings most likely due to steroids.     Acute kidney injury normal anion gap metabolic acidosis/hypokalemia Creatinine apparently had worsened to 1.45 on 10/10.  Has improved since then.  Renal function remains at baseline.  Normal bicarbonate levels.  Stop IV fluids.  Potassium is normal this morning.  Magnesium was 2.0 yesterday.    Normocytic anemia No evidence of overt bleeding.  Hemoglobin is stable.  Goals of  care Long discussion with patient's brother/legal guardian.  He was updated on patient's condition.  CODE STATUS was discussed.  He says no intubation no mechanical ventilation but one-time cardiac resuscitative efforts should be okay to do.  Will change to partial code.   DVT Prophylaxis: Lovenox PUD Prophylaxis: Pepcid Code Status: Partial code Family Communication: Brother being updated on a daily basis Disposition Plan: Brother mentioned that he will be unable to take care of the patient at home so patient does not have 24-hour supervision.  He will need to go to skilled nursing facility for short-term rehab first.     Medications:  Scheduled:  amLODipine  10 mg Oral Once per day on Sun Wed   aspirin  81 mg Oral Daily   calcium-vitamin D  1 tablet Oral Daily   clopidogrel  75 mg Oral Daily   dexamethasone (DECADRON) injection  6 mg Intravenous Q12H   donepezil  20 mg Oral QHS   enoxaparin (LOVENOX) injection  40 mg Subcutaneous Q12H   famotidine  20 mg Oral Daily   insulin aspart  0-15 Units Subcutaneous TID WC   insulin aspart  3 Units Subcutaneous TID WC   insulin glargine  20 Units Subcutaneous Daily   multivitamin  1 tablet Oral QHS   omega-3 acid ethyl esters  2 g Oral Daily   sodium chloride flush  3 mL Intravenous Q12H   tamsulosin  0.8 mg Oral QHS   vitamin C  500 mg Oral Daily   zinc sulfate  220 mg Oral Daily   Continuous:  sodium chloride     ZOX:WRUEAV chloride, acetaminophen, chlorpheniramine-HYDROcodone, guaiFENesin-dextromethorphan, ondansetron **OR** ondansetron (ZOFRAN) IV, sodium chloride flush   Objective:  Vital Signs  Vitals:   01/31/19 0710 01/31/19 0754 01/31/19 0830 01/31/19 0844  BP:  (!) 175/87    Pulse:  62    Resp:  18    Temp:      TempSrc:      SpO2: 90% 95% 98% 94%  Weight:      Height:        Intake/Output Summary (Last 24 hours) at 01/31/2019 0944 Last data filed at 01/31/2019 0837 Gross per 24 hour  Intake  600 ml  Output 300 ml  Net 300 ml   Filed Weights   01/22/19 2048  Weight: 71.1 kg    General appearance: Awake alert.  In no distress.  Distracted Resp: Effort is normal this morning.  No significant changes in aeration compared to yesterday.  Few crackles at the bases.  No wheezing or rhonchi.   Cardio: S1-S2 is normal regular.  No S3-S4.  No rubs murmurs or bruit GI: Abdomen is soft.  Nontender nondistended.  Bowel sounds are present normal.  No masses organomegaly Extremities: No edema.  Neurologic: No obvious focal deficits.  He is pleasantly confused.   Lab Results:  Data Reviewed: I have personally reviewed following labs and imaging studies  CBC: Recent Labs  Lab 01/25/19 0354 01/26/19 0134 01/27/19 0410 01/31/19 0037  WBC 6.1 7.6 6.7  11.3*  NEUTROABS 5.3 6.8 5.7  --   HGB 10.8* 9.8* 10.4* 10.1*  HCT 31.8* 29.2* 30.3* 29.4*  MCV 90.3 89.8 88.3 88.0  PLT 149* 162 178 254    Basic Metabolic Panel: Recent Labs  Lab 01/27/19 0410 01/28/19 0055 01/29/19 0120 01/30/19 0115 01/31/19 0037  NA 139 134* 138 137 137  K 3.8 3.9 3.4* 3.2* 3.9  CL 104 99 103 104 106  CO2 25 22 24 23 23   GLUCOSE 111* 362* 250* 184* 226*  BUN 34* 35* 26* 29* 33*  CREATININE 0.98 1.22 1.10 1.14 1.09  CALCIUM 8.2* 8.0* 8.1* 8.1* 8.0*  MG  --   --   --  2.0  --     GFR: Estimated Creatinine Clearance: 48.9 mL/min (by C-G formula based on SCr of 1.09 mg/dL).  Liver Function Tests: Recent Labs  Lab 01/25/19 0354 01/26/19 0134 01/27/19 0410 01/28/19 0055 01/29/19 0120  AST 24 21 21  106* 29  ALT 22 18 18  81* 47*  ALKPHOS 63 55 52 78 62  BILITOT 0.8 1.0 1.0 0.8 1.2  PROT 6.2* 5.5* 5.4* 5.2* 5.3*  ALBUMIN 3.3* 3.0* 2.8* 2.6* 2.6*     CBG: Recent Labs  Lab 01/29/19 1639 01/29/19 1944 01/30/19 0750 01/30/19 1204 01/31/19 0822  GLUCAP 85 160* 230* 275* 255*     Anemia Panel: Recent Labs    01/29/19 0120  FERRITIN 689*    Recent Results (from the past 240  hour(s))  Blood Culture (routine x 2)     Status: None   Collection Time: 01/22/19 12:24 PM   Specimen: BLOOD  Result Value Ref Range Status   Specimen Description BLOOD BLOOD LEFT HAND  Final   Special Requests   Final    BOTTLES DRAWN AEROBIC AND ANAEROBIC Blood Culture adequate volume   Culture   Final    NO GROWTH 5 DAYS Performed at [facility], [address] [place], [place] [address]    Report Status 01/27/2019 FINAL  Final  Blood Culture (routine x 2)     Status: None   Collection Time: 01/22/19 12:24 PM   Specimen: BLOOD  Result Value Ref Range Status   Specimen Description BLOOD BLOOD RIGHT HAND  Final   Special Requests   Final    BOTTLES DRAWN AEROBIC AND ANAEROBIC Blood Culture adequate volume   Culture   Final    NO GROWTH 5 DAYS Performed at [facility], [address] [place], [place] [address]    Report Status 01/27/2019 FINAL  Final      Radiology Studies: Vas Koreas Lower Extremity Venous (dvt)  Result Date: 01/29/2019  Lower Venous Study Indications: Edema. Other Indications: COVID. Anticoagulation: Enoxaparin. Performing Technologist: Leta Junglingiffany Cooper RDCS  Examination Guidelines: A complete evaluation includes B-mode imaging, spectral Doppler, color Doppler, and power Doppler as needed of all accessible portions of each vessel. Bilateral testing is considered an integral part of a complete examination. Limited examinations for reoccurring indications may be performed as noted.  +---------+---------------+---------+-----------+----------+--------------+  RIGHT     Compressibility Phasicity Spontaneity Properties Thrombus Aging  +---------+---------------+---------+-----------+----------+--------------+  CFV       Full            Yes       Yes                                    +---------+---------------+---------+-----------+----------+--------------+  FV  Prox   Full                                                              +---------+---------------+---------+-----------+----------+--------------+  FV Mid    Full                                                             +---------+---------------+---------+-----------+----------+--------------+  FV Distal Full                                                             +---------+---------------+---------+-----------+----------+--------------+  POP                                                        Not visualized  +---------+---------------+---------+-----------+----------+--------------+  PTV                                                        Not visualized  +---------+---------------+---------+-----------+----------+--------------+  PERO                                                       Not visualized  +---------+---------------+---------+-----------+----------+--------------+   +---------+---------------+---------+-----------+----------+-------------------+  LEFT      Compressibility Phasicity Spontaneity Properties Thrombus Aging       +---------+---------------+---------+-----------+----------+-------------------+  CFV                       Yes       Yes                    unable to fully                                                                  compress. Patient                                                                flow visualized  with color and                                                                   spectral doppler     +---------+---------------+---------+-----------+----------+-------------------+  FV Prox   Full                                                                  +---------+---------------+---------+-----------+----------+-------------------+  FV Mid    Full                                                                  +---------+---------------+---------+-----------+----------+-------------------+  FV Distal Full                                                                   +---------+---------------+---------+-----------+----------+-------------------+  POP       Full            Yes       Yes                                         +---------+---------------+---------+-----------+----------+-------------------+  PTV                                                        Not visualized       +---------+---------------+---------+-----------+----------+-------------------+  PERO                                                       Not visualized       +---------+---------------+---------+-----------+----------+-------------------+     Summary: Right: There is no evidence of deep vein thrombosis in the lower extremity. However, portions of this examination were limited- see technologist comments above. Very difficult study du to patients altered mental status. Unable to visualize Pop V, PTV, and Peroneal V. Left: There is no evidence of deep vein thrombosis in the lower extremity. However, portions of this examination were limited- see technologist comments above. No cystic structure found in the popliteal fossa. Unable to visualize the PTV and Pero V.  *See table(s) above for measurements and observations. Electronically signed by Curt Jews MD on 01/29/2019 at 3:06:59 PM.  Final        LOS: 9 days   [employer] on [email or web address]  01/31/2019, 9:44 AM

## 2019-02-01 ENCOUNTER — Inpatient Hospital Stay (HOSPITAL_COMMUNITY): Payer: Medicare Other

## 2019-02-01 LAB — BASIC METABOLIC PANEL
Anion gap: 10 (ref 5–15)
BUN: 29 mg/dL — ABNORMAL HIGH (ref 8–23)
CO2: 26 mmol/L (ref 22–32)
Calcium: 8.4 mg/dL — ABNORMAL LOW (ref 8.9–10.3)
Chloride: 104 mmol/L (ref 98–111)
Creatinine, Ser: 1.07 mg/dL (ref 0.61–1.24)
GFR calc Af Amer: >60 mL/min (ref >60)
GFR calc non Af Amer: >60 mL/min (ref >60)
Glucose, Bld: 60 mg/dL — ABNORMAL LOW (ref 70–99)
Potassium: 3.6 mmol/L (ref 3.5–5.1)
Sodium: 140 mmol/L (ref 135–145)

## 2019-02-01 LAB — GLUCOSE, CAPILLARY
Glucose-Capillary: 111 mg/dL — ABNORMAL HIGH (ref 70–99)
Glucose-Capillary: 310 mg/dL — ABNORMAL HIGH (ref 70–99)
Glucose-Capillary: 463 mg/dL — ABNORMAL HIGH (ref 70–99)
Glucose-Capillary: 334 mg/dL — ABNORMAL HIGH (ref 70–99)
Glucose-Capillary: 236 mg/dL — ABNORMAL HIGH (ref 70–99)

## 2019-02-01 LAB — C-REACTIVE PROTEIN: CRP: 9.8 mg/dL — ABNORMAL HIGH (ref <1.0)

## 2019-02-01 LAB — D-DIMER, QUANTITATIVE: D-Dimer, Quant: 2.05 ug/mL-FEU — ABNORMAL HIGH (ref 0.00–0.50)

## 2019-02-01 LAB — GLUCOSE, RANDOM: Glucose, Bld: 425 mg/dL — ABNORMAL HIGH (ref 70–99)

## 2019-02-01 MED ORDER — INSULIN GLARGINE 100 UNIT/ML ~~LOC~~ SOLN
15.0000 [IU] | Freq: Once | SUBCUTANEOUS | Status: AC
  Administered 2019-02-01: 15 [IU] via SUBCUTANEOUS
  Filled 2019-02-01: qty 0.15

## 2019-02-01 MED ORDER — INSULIN ASPART 100 UNIT/ML ~~LOC~~ SOLN
20.0000 [IU] | Freq: Once | SUBCUTANEOUS | Status: AC
  Administered 2019-02-01: 20 [IU] via SUBCUTANEOUS

## 2019-02-01 MED ORDER — FUROSEMIDE 10 MG/ML IJ SOLN
40.0000 mg | Freq: Once | INTRAMUSCULAR | Status: AC
  Administered 2019-02-01: 40 mg via INTRAVENOUS
  Filled 2019-02-01: qty 4

## 2019-02-01 MED ORDER — INSULIN GLARGINE 100 UNIT/ML ~~LOC~~ SOLN
15.0000 [IU] | Freq: Every day | SUBCUTANEOUS | Status: DC
  Administered 2019-02-02 – 2019-02-05 (×4): 15 [IU] via SUBCUTANEOUS
  Filled 2019-02-01 (×4): qty 0.15

## 2019-02-01 MED ORDER — POTASSIUM CHLORIDE CRYS ER 20 MEQ PO TBCR
40.0000 meq | EXTENDED_RELEASE_TABLET | Freq: Once | ORAL | Status: AC
  Administered 2019-02-01: 40 meq via ORAL
  Filled 2019-02-01: qty 2

## 2019-02-01 NOTE — Progress Notes (Signed)
01/31/19 1100  SLP Visit Information  SLP Received On 01/31/19  General Information  HPI  84 year old male who presented with dyspnea and altered mental status.  He does have significant past medical history of type 2 diabetes mellitus, hypertension and dementia.  Not feeling well for about 9 days, with decrease in cognitive function and generalized weakness, had one emergency department visit on September 28, eventually discharge home from the ED, October 5 tested positive for SARS COVID-19.  At home he continued to have rapid decline in his cognitive function.  Patient was admitted to the hospital working diagnosis of SARS COVID-19 viral pneumonia complicated by metabolic encephalopathy. He has been observed to cough when he eats and drinks.   Type of Study Bedside Swallow Evaluation  Previous Swallow Assessment none in chart  Diet Prior to this Study Regular;Thin liquids  Temperature Spikes Noted No  Respiratory Status Nasal cannula  History of Recent Intubation No  Behavior/Cognition Alert;Cooperative;Confused  Oral Cavity Assessment WFL  Oral Care Completed by SLP No  Oral Cavity - Dentition Adequate natural dentition  Self-Feeding Abilities Able to feed self  Patient Positioning Upright in bed  Baseline Vocal Quality Normal  Volitional Cough Strong  Volitional Swallow Able to elicit  Oral Motor/Sensory Function  Overall Oral Motor/Sensory Function WFL  Thin Liquid  Thin Liquid Impaired  Presentation Cup;Self Fed  Pharyngeal  Phase Impairments Cough - Delayed  Nectar Thick Liquid  Nectar Thick Liquid NT  Honey Thick Liquid  Honey Thick Liquid NT  Puree  Puree NT  Solid  Solid Impaired  Pharyngeal Phase Impairments Cough - Delayed  SLP Assessment  Clinical Impression Statement (ACUTE ONLY) Pt demonstrates initial tolerance of thin liquids and fruit; mastication good, swallow response subjectively appears good. No history of dysphagia in chart. RR is stable and though pt is  confused he is fully alert and able to self feed. It has been observed repeatedly however that 10-30 seconds after swallowing pt occasionally has some coughing/throat clearing. RN reports significant desaturation events as well and there is concern for aspiration.  Subjectively, my main suspicion is for post-prandial events/signs of reflux.  Will attempt instrumental assessment, MBS, tomorrow pm though it is sometimes difficult to complete these on a weekend. Also as an added precaution, will empirically thicken pts liquids, as this may decrease potential aspiration events and is low chance of harming the pt.   SLP Visit Diagnosis Dysphagia, oropharyngeal phase (R13.12)  Impact on safety and function Mild aspiration risk  Other Related Risk Factors Cognitive impairment  Swallow Evaluation Recommendations  SLP Diet Recommendations Dysphagia 3 (Mech soft);Nectar-thick liquid  Liquid Administration via Cup;Straw  Medication Administration Whole meds with puree  Supervision Patient able to self feed  Compensations Slow rate;Small sips/bites  Postural Changes Seated upright at 90 degrees;Remain upright for at least 30 minutes after po intake  Treatment Plan  Oral Care Recommendations Oral care BID  Follow up Recommendations Skilled Nursing facility  Speech Therapy Frequency (ACUTE ONLY) min 2x/week  Treatment Duration 2 weeks  Interventions Aspiration precaution training;Diet toleration management by SLP;Trials of upgraded texture/liquids  Prognosis  Prognosis for Safe Diet Advancement Good  Individuals Consulted  Consulted and Agree with Results and Recommendations Patient;RN  Progression Toward Goals  Progression toward goals Progressing toward goals  SLP Time Calculation  SLP Start Time (ACUTE ONLY) 1308  SLP Stop Time (ACUTE ONLY) 1320  SLP Time Calculation (min) (ACUTE ONLY) 12 min  SLP Evaluations  $ SLP Speech Visit 1  Visit  SLP Evaluations  $BSS Swallow 1 Procedure

## 2019-02-01 NOTE — Progress Notes (Signed)
PROGRESS NOTE  Jesus [name] DOB: Dec 12, 1934 DOA: 01/22/2019  PCP: Barbette ReichmannHande, Vishwanath, MD  Brief History/Interval Summary: 84 year old male who presented with dyspnea and altered mental status. He does have significant past medical history of type 2 diabetes mellitus, hypertension and dementia. Not feeling well for about 9 days, with decrease in cognitive function and generalized weakness, had one emergency department visit on September 28, eventually discharge home from the ED, October 5 tested positive for SARS COVID-19. At home he continued to have rapid decline in his cognitive function. Patient was admitted to the hospital working diagnosis of SARS COVID-19 viral pneumonia complicated by metabolic encephalopathy.  Reason for Visit: Acute respiratory failure with hypoxia.  Pneumonia due to COVID-19.  Consultants: None  Procedures: None  Antibiotics: Anti-infectives (From admission, onward)   Start     Dose/Rate Route Frequency Ordered Stop   01/26/19 1000  remdesivir 100 mg in sodium chloride 0.9 % 250 mL IVPB     100 mg 500 mL/hr over 30 Minutes Intravenous Every 24 hours 01/25/19 1216 01/29/19 0903   01/25/19 1400  remdesivir 200 mg in sodium chloride 0.9 % 250 mL IVPB     200 mg 500 mL/hr over 30 Minutes Intravenous Once 01/25/19 1216 01/25/19 1545      Subjective/Interval History: Patient states that he is feeling well.  No overnight issues noted.  Remains on 4 L of oxygen.  Denies any shortness of breath or chest pain.  Appetite has been poor.  However he does state today that he is hungry.   Assessment/Plan:  Acute Hypoxic Resp. Failure/Pneumonia due to COVID-19  COVID-19 Labs  Recent Labs    01/30/19 0115 01/31/19 0037 02/01/19 0047  DDIMER 5.19* 2.38* 2.05*  CRP 19.0* 15.3* 9.8*    Positive COVID-19 results available on care everywhere from 10/5.  Fever: Afebrile Oxygen requirements: High flow nasal cannula.  4 L/min.  Saturating in the  early 90s. Antibacterials: None Remdesivir: Completed a 5-day course on 10/14 Steroids: Dexamethasone 6 mg twice daily Diuretics: Not on diuretics on a scheduled basis Actemra: None given yet Vitamin C and Zinc: Continue DVT Prophylaxis:  Lovenox 40 mg twice daily  Patient's respiratory status is stable.  Still requiring about 4 L of oxygen.  Chest x-ray done yesterday did show worsening of bilateral opacities.  Patient could have aspirated as well.  Speech therapy consulted.  CRP is improving.  Continue with high dose of dexamethasone.  D-dimer is improving.  CT angiogram of the chest done 3 days ago did not show any PE.  Lower extremity Doppler studies were negative for DVT.  Aspiration precautions.  Prognosis is guarded.  Patient was given furosemide yesterday with good diuresis.  May have helped his oxygenation.  Will give another dose today.  Replace potassium.  Acute metabolic encephalopathy in the setting of dementia Mental status seems to be stable and possibly at baseline.  Continue donepezil.  No behavioral disturbances noted.     Diabetes mellitus type 2 uncontrolled with hyperglycemia Hyperglycemia is most likely due to steroids.  HbA1c 6.7.  Continue SSI.  Yesterday dose of Lantus was increased.  He was noted to be hypoglycemic on his basic metabolic panel this morning.  Recheck is normal.  Hold Lantus for now.  Will decrease dose.     Essential hypertension Occasional high readings noted.  Continue amlodipine.  High readings most likely due to steroids.     Acute kidney injury normal anion gap metabolic acidosis/hypokalemia Creatinine apparently had  worsened to 1.45 on 10/10.  Has improved since then.  Renal function remains at baseline.  Normal bicarbonate levels.  IV fluids discontinued.  Potassium is 3.6 today.  Magnesium was normal when last checked.     Normocytic anemia No evidence of overt bleeding.  Hemoglobin has been stable.  Goals of care Long discussion with  patient's brother/legal guardian.  He was updated on patient's condition.  CODE STATUS was discussed.  He says no intubation no mechanical ventilation but one-time cardiac resuscitative efforts should be okay to do.  He was changed to partial code.   DVT Prophylaxis: Lovenox PUD Prophylaxis: Pepcid Code Status: Partial code Family Communication: Brother being updated on a daily basis Disposition Plan: Brother mentioned that he will be unable to take care of the patient at home so patient does not have 24-hour supervision.  He will need to go to skilled nursing facility for short-term rehab first.     Medications:  Scheduled: . amLODipine  10 mg Oral Once per day on Sun Wed  . aspirin  81 mg Oral Daily  . calcium-vitamin D  1 tablet Oral Daily  . clopidogrel  75 mg Oral Daily  . dexamethasone (DECADRON) injection  6 mg Intravenous Q12H  . donepezil  20 mg Oral QHS  . enoxaparin (LOVENOX) injection  40 mg Subcutaneous Q12H  . famotidine  20 mg Oral Daily  . insulin aspart  0-15 Units Subcutaneous TID WC  . insulin aspart  6 Units Subcutaneous TID WC  . insulin glargine  24 Units Subcutaneous Daily  . multivitamin  1 tablet Oral QHS  . omega-3 acid ethyl esters  2 g Oral Daily  . sodium chloride flush  10-40 mL Intracatheter Q12H  . sodium chloride flush  3 mL Intravenous Q12H  . tamsulosin  0.8 mg Oral QHS  . vitamin C  500 mg Oral Daily  . zinc sulfate  220 mg Oral Daily   Continuous: . sodium chloride     VQM:GQQPYP chloride, acetaminophen, chlorpheniramine-HYDROcodone, guaiFENesin-dextromethorphan, ondansetron **OR** ondansetron (ZOFRAN) IV, Resource ThickenUp Clear, sodium chloride flush, sodium chloride flush   Objective:  Vital Signs  Vitals:   01/31/19 1939 01/31/19 2015 02/01/19 0400 02/01/19 0729  BP:  139/75 (!) 142/64 140/71  Pulse: 69 72 70 71  Resp: 19 20 18 16   Temp:  98.8 F (37.1 C) 98.6 F (37 C) 98.3 F (36.8 C)  TempSrc:  Oral Oral Axillary  SpO2:  97% 92% 91% 90%  Weight:      Height:        Intake/Output Summary (Last 24 hours) at 02/01/2019 1020 Last data filed at 02/01/2019 0848 Gross per 24 hour  Intake 760 ml  Output 2350 ml  Net -1590 ml   Filed Weights   01/22/19 2048  Weight: 71.1 kg    General appearance: Awake alert.  In no distress.  Distracted Resp: Normal effort at rest.  Few crackles noted bilaterally at the bases.  No wheezing or rhonchi.   Cardio: S1-S2 is normal regular.  No S3-S4.  No rubs murmurs or bruit GI: Abdomen is soft.  Nontender nondistended.  Bowel sounds are present normal.  No masses organomegaly Extremities: No edema.  Neurologic: No focal neurological deficits.    Lab Results:  Data Reviewed: I have personally reviewed following labs and imaging studies  CBC: Recent Labs  Lab 01/26/19 0134 01/27/19 0410 01/31/19 0037  WBC 7.6 6.7 11.3*  NEUTROABS 6.8 5.7  --   HGB  9.8* 10.4* 10.1*  HCT 29.2* 30.3* 29.4*  MCV 89.8 88.3 88.0  PLT 162 178 254    Basic Metabolic Panel: Recent Labs  Lab 01/28/19 0055 01/29/19 0120 01/30/19 0115 01/31/19 0037 02/01/19 0047  NA 134* 138 137 137 140  K 3.9 3.4* 3.2* 3.9 3.6  CL 99 103 104 106 104  CO2 22 24 23 23 26   GLUCOSE 362* 250* 184* 226* 60*  BUN 35* 26* 29* 33* 29*  CREATININE 1.22 1.10 1.14 1.09 1.07  CALCIUM 8.0* 8.1* 8.1* 8.0* 8.4*  MG  --   --  2.0  --   --     GFR: Estimated Creatinine Clearance: 49.8 mL/min (by C-G formula based on SCr of 1.07 mg/dL).  Liver Function Tests: Recent Labs  Lab 01/26/19 0134 01/27/19 0410 01/28/19 0055 01/29/19 0120  AST 21 21 106* 29  ALT 18 18 81* 47*  ALKPHOS 55 52 78 62  BILITOT 1.0 1.0 0.8 1.2  PROT 5.5* 5.4* 5.2* 5.3*  ALBUMIN 3.0* 2.8* 2.6* 2.6*     CBG: Recent Labs  Lab 01/31/19 0822 01/31/19 1157 01/31/19 1646 01/31/19 2014 02/01/19 0728  GLUCAP 255* 349* 272* 192* 111*     Recent Results (from the past 240 hour(s))  Blood Culture (routine x 2)     Status:  None   Collection Time: 01/22/19 12:24 PM   Specimen: BLOOD  Result Value Ref Range Status   Specimen Description BLOOD BLOOD LEFT HAND  Final   Special Requests   Final    BOTTLES DRAWN AEROBIC AND ANAEROBIC Blood Culture adequate volume   Culture   Final    NO GROWTH 5 DAYS Performed at [facility], [address], [place], [place]    Report Status 01/27/2019 FINAL  Final  Blood Culture (routine x 2)     Status: None   Collection Time: 01/22/19 12:24 PM   Specimen: BLOOD  Result Value Ref Range Status   Specimen Description BLOOD BLOOD RIGHT HAND  Final   Special Requests   Final    BOTTLES DRAWN AEROBIC AND ANAEROBIC Blood Culture adequate volume   Culture   Final    NO GROWTH 5 DAYS Performed at [facility], [address] [place], [place]    Report Status 01/27/2019 FINAL  Final      Radiology Studies: Dg Chest Port 1 View  Result Date: 01/31/2019 CLINICAL DATA:  COVID-19 pneumonia, dyspnea EXAM: PORTABLE CHEST 1 VIEW COMPARISON:  01/22/2019 chest radiograph. 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 Stable cardiomediastinal silhouette with top-normal heart size. No pneumothorax. No pleural effusion. Extensive patchy opacities throughout the peripheral lungs bilaterally, substantially worsened. IMPRESSION: Substantial worsening of extensive patchy lung opacities throughout the peripheral lungs bilaterally, compatible with COVID-19 pneumonia. Electronically Signed   By: 03/24/2019 M.D.   On: 01/31/2019 13:05       LOS: 10 days   Nicosha Struve 02/02/2019  [facility] Pager on [email or web address]  02/01/2019, 10:20 AM

## 2019-02-01 NOTE — Progress Notes (Signed)
  Speech Language Pathology Treatment: Dysphagia  Patient Details Name: Jesus [name] MRN: [id number] DOB: 1934-05-30 Today's Date: 02/01/2019 Time: 4315-4008 SLP Time Calculation (min) (ACUTE ONLY): 15 min  Assessment / Plan / Recommendation Clinical Impression  Attempted MBS; pt could not maintain O2 sats even on 15 L of oxygen after movinf to MBS chair and so MBS was deferred until 10/19. Observed pt with nectar thick liquids, still noted to have delayed throat clearing, also noted with pudding. Continue current diet with aspiration and reflux precautions. Will still plan to f/u with MBS given ongoing concern for dysphagia.   HPI HPI:  84 year old male who presented with dyspnea and altered mental status.  He does have significant past medical history of type 2 diabetes mellitus, hypertension and dementia.  Not feeling well for about 9 days, with decrease in cognitive function and generalized weakness, had one emergency department visit on September 28, eventually discharge home from the ED, October 5 tested positive for SARS COVID-19.  At home he continued to have rapid decline in his cognitive function.  Patient was admitted to the hospital working diagnosis of SARS COVID-19 viral pneumonia complicated by metabolic encephalopathy. He has been observed to cough when he eats and drinks.       SLP Plan  Continue with current plan of care;MBS       Recommendations  Diet recommendations: Dysphagia 1 (puree);Nectar-thick liquid Medication Administration: Whole meds with puree Supervision: Patient able to self feed Compensations: Slow rate;Small sips/bites Postural Changes and/or Swallow Maneuvers: Seated upright 90 degrees                Oral Care Recommendations: Oral care BID Follow up Recommendations: Skilled Nursing facility SLP Visit Diagnosis: Dysphagia, oropharyngeal phase (R13.12) Plan: Continue with current plan of care;MBS       GO               Herbie [place], MA  [place] Pager [phone] Office [phone]  Lynann Beaver 02/01/2019, 1:32 PM

## 2019-02-01 NOTE — Progress Notes (Signed)
0930: spoke to MD concerning patient's insulin as glucose was low on am labs. Held novolog due to not meeting parameters to administer, questioning lantus. MD states to hold insulin at this time.   1125: MD changed orders to decrease lantus insulin. New order is ordered to start tomorrow. RN clarified order start time. MD states lantus is ordered to start tomorrow but to call if patient CBG >250. Will continue to monitor and follow plan of care.

## 2019-02-01 NOTE — Progress Notes (Signed)
Paged Dr [place] Pink regarding CBG level of 463 to obtain order for amount of SSI to be administered. Glucose re-check ordered.

## 2019-02-02 LAB — GLUCOSE, CAPILLARY
Glucose-Capillary: 260 mg/dL — ABNORMAL HIGH (ref 70–99)
Glucose-Capillary: 342 mg/dL — ABNORMAL HIGH (ref 70–99)
Glucose-Capillary: 435 mg/dL — ABNORMAL HIGH (ref 70–99)
Glucose-Capillary: 258 mg/dL — ABNORMAL HIGH (ref 70–99)

## 2019-02-02 LAB — BASIC METABOLIC PANEL
Anion gap: 12 (ref 5–15)
BUN: 36 mg/dL — ABNORMAL HIGH (ref 8–23)
CO2: 24 mmol/L (ref 22–32)
Calcium: 8.3 mg/dL — ABNORMAL LOW (ref 8.9–10.3)
Chloride: 103 mmol/L (ref 98–111)
Creatinine, Ser: 1.19 mg/dL (ref 0.61–1.24)
GFR calc Af Amer: >60 mL/min (ref >60)
GFR calc non Af Amer: 57 mL/min — ABNORMAL LOW (ref >60)
Glucose, Bld: 189 mg/dL — ABNORMAL HIGH (ref 70–99)
Potassium: 3.9 mmol/L (ref 3.5–5.1)
Sodium: 139 mmol/L (ref 135–145)

## 2019-02-02 LAB — C-REACTIVE PROTEIN: CRP: 5.5 mg/dL — ABNORMAL HIGH (ref <1.0)

## 2019-02-02 LAB — GLUCOSE, RANDOM: Glucose, Bld: 470 mg/dL — ABNORMAL HIGH (ref 70–99)

## 2019-02-02 MED ORDER — INSULIN ASPART 100 UNIT/ML ~~LOC~~ SOLN
20.0000 [IU] | Freq: Once | SUBCUTANEOUS | Status: AC
  Administered 2019-02-02: 20 [IU] via SUBCUTANEOUS

## 2019-02-02 MED ORDER — POTASSIUM CHLORIDE CRYS ER 20 MEQ PO TBCR
40.0000 meq | EXTENDED_RELEASE_TABLET | Freq: Once | ORAL | Status: AC
  Administered 2019-02-02: 40 meq via ORAL
  Filled 2019-02-02: qty 2

## 2019-02-02 MED ORDER — FUROSEMIDE 10 MG/ML IJ SOLN
40.0000 mg | Freq: Once | INTRAMUSCULAR | Status: AC
  Administered 2019-02-02: 40 mg via INTRAVENOUS
  Filled 2019-02-02: qty 4

## 2019-02-02 NOTE — Progress Notes (Signed)
Spoke with patient's brother Jesus [name] and updated via telephone. All questions answered at this time.

## 2019-02-02 NOTE — Progress Notes (Signed)
PROGRESS NOTE  Jesus [name] ZOX:[id number] DOB: 07/18/1934 DOA: 01/22/2019  PCP: Barbette Reichmann, MD  Brief History/Interval Summary: 84 year old male who presented with dyspnea and altered mental status. He does have significant past medical history of type 2 diabetes mellitus, hypertension and dementia. Not feeling well for about 9 days, with decrease in cognitive function and generalized weakness, had one emergency department visit on September 28, eventually discharge home from the ED, October 5 tested positive for SARS COVID-19. At home he continued to have rapid decline in his cognitive function. Patient was admitted to the hospital working diagnosis of SARS COVID-19 viral pneumonia complicated by metabolic encephalopathy.  Reason for Visit: Acute respiratory failure with hypoxia.  Pneumonia due to COVID-19.  Consultants: None  Procedures: None  Antibiotics: Anti-infectives (From admission, onward)   Start     Dose/Rate Route Frequency Ordered Stop   01/26/19 1000  remdesivir 100 mg in sodium chloride 0.9 % 250 mL IVPB     100 mg 500 mL/hr over 30 Minutes Intravenous Every 24 hours 01/25/19 1216 01/29/19 0903   01/25/19 1400  remdesivir 200 mg in sodium chloride 0.9 % 250 mL IVPB     200 mg 500 mL/hr over 30 Minutes Intravenous Once 01/25/19 1216 01/25/19 1545      Subjective/Interval History: Patient states that he feels well.  Denies any complaints.  Not very communicative.     Assessment/Plan:  Acute Hypoxic Resp. Failure/Pneumonia due to COVID-19  COVID-19 Labs  Recent Labs    01/31/19 0037 02/01/19 0047 02/02/19 0047  DDIMER 2.38* 2.05*  --   CRP 15.3* 9.8* 5.5*    Positive COVID-19 results available on care everywhere from 10/5.  Fever: Afebrile Oxygen requirements: Nasal cannula.  4 L/min.  Saturating the early 90s.  Antibacterials: None Remdesivir: Completed a 5-day course on 10/14 Steroids: Dexamethasone 6 mg twice daily Diuretics: Lasix was  given the last 2 days Actemra: None given yet Vitamin C and Zinc: Continue DVT Prophylaxis:  Lovenox 40 mg twice daily  Patient's respiratory status remained stable.  He still requiring 4 L of oxygen by nasal cannula.  It is quite possible he may have aspirated recently.  Speech therapy is following.  Plan is for a modified barium swallow.  He is on a dysphagia 1 diet.  Inflammatory markers have improved.  He has completed course of Remdesivir.  He remains on steroids.  He was given Lasix the last 2 days.  No significant change in oxygenation noted over the last 24 hours.  Give additional dose today.  No clear indication for antibiotics considering stability.  Acute metabolic encephalopathy in the setting of dementia Mental status seems to be stable and possibly at baseline.  Continue donepezil.  No behavioral disturbances noted.     Diabetes mellitus type 2 uncontrolled with hyperglycemia Hyperglycemia is most likely due to steroids.  HbA1c 6.7.  Continue SSI.  CBGs have been fluctuating a lot.  Continue current dose of Lantus insulin.    Essential hypertension Blood pressure is reasonably well controlled.  Continue amlodipine.     Acute kidney injury normal anion gap metabolic acidosis Creatinine apparently had worsened to 1.45 on 10/10.  Has improved since then.  Renal function remains at baseline.  Normal bicarbonate levels.  IV fluids discontinued.  Potassium is normal today.  Normocytic anemia No evidence of overt bleeding.  Hemoglobin has been stable.  Goals of care Long discussion with patient's brother/legal guardian.  He was updated on patient's condition.  CODE  STATUS was discussed.  He says no intubation no mechanical ventilation but one-time cardiac resuscitative efforts should be okay to do.  He was changed to partial code.   DVT Prophylaxis: Lovenox PUD Prophylaxis: Pepcid Code Status: Partial code Family Communication: Brother, who is the legal guardian, being updated on  a daily basis Disposition Plan: Brother mentioned that he will be unable to take care of the patient at home so patient does not have 24-hour supervision.  He will need to go to skilled nursing facility for short-term rehab first.  Hopefully discharge in 1 to 2 days.   Medications:  Scheduled: . amLODipine  10 mg Oral Once per day on Sun Wed  . aspirin  81 mg Oral Daily  . calcium-vitamin D  1 tablet Oral Daily  . clopidogrel  75 mg Oral Daily  . dexamethasone (DECADRON) injection  6 mg Intravenous Q12H  . donepezil  20 mg Oral QHS  . enoxaparin (LOVENOX) injection  40 mg Subcutaneous Q12H  . famotidine  20 mg Oral Daily  . insulin aspart  0-15 Units Subcutaneous TID WC  . insulin aspart  6 Units Subcutaneous TID WC  . insulin glargine  15 Units Subcutaneous Daily  . multivitamin  1 tablet Oral QHS  . omega-3 acid ethyl esters  2 g Oral Daily  . sodium chloride flush  10-40 mL Intracatheter Q12H  . sodium chloride flush  3 mL Intravenous Q12H  . tamsulosin  0.8 mg Oral QHS  . vitamin C  500 mg Oral Daily  . zinc sulfate  220 mg Oral Daily   Continuous: . sodium chloride     EXB:MWUXLK chloride, acetaminophen, chlorpheniramine-HYDROcodone, guaiFENesin-dextromethorphan, ondansetron **OR** ondansetron (ZOFRAN) IV, Resource ThickenUp Clear, sodium chloride flush, sodium chloride flush   Objective:  Vital Signs  Vitals:   02/02/19 0000 02/02/19 0411 02/02/19 0800 02/02/19 0836  BP: 137/76 138/65 (!) 145/102   Pulse: (!) 59 61 60   Resp:  16 18   Temp: 98.4 F (36.9 C) 98.5 F (36.9 C) 97.9 F (36.6 C)   TempSrc: Oral Axillary Oral   SpO2: 95% 96% 93% 90%  Weight:      Height:        Intake/Output Summary (Last 24 hours) at 02/02/2019 0933 Last data filed at 02/02/2019 0840 Gross per 24 hour  Intake 240 ml  Output 1551 ml  Net -1311 ml   Filed Weights   01/22/19 2048  Weight: 71.1 kg    General appearance: Awake alert.  In no distress.  Distracted Resp: Coarse  breath sounds bilaterally.  Normal effort at rest.  Few crackles at the bases.  No wheezing or rhonchi.   Cardio: S1-S2 is normal regular.  No S3-S4.  No rubs murmurs or bruit GI: Abdomen is soft.  Nontender nondistended.  Bowel sounds are present normal.  No masses organomegaly Extremities: No edema.  Moving all his extremities Neurologic:   No focal neurological deficits.    Lab Results:  Data Reviewed: I have personally reviewed following labs and imaging studies  CBC: Recent Labs  Lab 01/27/19 0410 01/31/19 0037  WBC 6.7 11.3*  NEUTROABS 5.7  --   HGB 10.4* 10.1*  HCT 30.3* 29.4*  MCV 88.3 88.0  PLT 178 440    Basic Metabolic Panel: Recent Labs  Lab 01/29/19 0120 01/30/19 0115 01/31/19 0037 02/01/19 0047 02/01/19 1730 02/02/19 0047  NA 138 137 137 140  --  139  K 3.4* 3.2* 3.9 3.6  --  3.9  CL 103 104 106 104  --  103  CO2 24 23 23 26   --  24  GLUCOSE 250* 184* 226* 60* 425* 189*  BUN 26* 29* 33* 29*  --  36*  CREATININE 1.10 1.14 1.09 1.07  --  1.19  CALCIUM 8.1* 8.1* 8.0* 8.4*  --  8.3*  MG  --  2.0  --   --   --   --     GFR: Estimated Creatinine Clearance: 44.7 mL/min (by C-G formula based on SCr of 1.19 mg/dL).  Liver Function Tests: Recent Labs  Lab 01/27/19 0410 01/28/19 0055 01/29/19 0120  AST 21 106* 29  ALT 18 81* 47*  ALKPHOS 52 78 62  BILITOT 1.0 0.8 1.2  PROT 5.4* 5.2* 5.3*  ALBUMIN 2.8* 2.6* 2.6*     CBG: Recent Labs  Lab 02/01/19 1132 02/01/19 1628 02/01/19 2105 02/01/19 2242 02/02/19 0823  GLUCAP 310* 463* 334* 236* 260*     No results found for this or any previous visit (from the past 240 hour(s)).    Radiology Studies: Dg Chest Port 1 View  Result Date: 01/31/2019 CLINICAL DATA:  COVID-19 pneumonia, dyspnea EXAM: PORTABLE CHEST 1 VIEW COMPARISON:  01/22/2019 chest radiograph. 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 Stable cardiomediastinal silhouette with top-normal heart size. No  pneumothorax. No pleural effusion. Extensive patchy opacities throughout the peripheral lungs bilaterally, substantially worsened. IMPRESSION: Substantial worsening of extensive patchy lung opacities throughout the peripheral lungs bilaterally, compatible with COVID-19 pneumonia. Electronically Signed   By: Delbert PhenixJason A Poff M.D.   On: 01/31/2019 13:05       LOS: 11 days   Terril Amaro Foot LockerKrishnan  [facility] Pager on [email or web address]  02/02/2019, 9:33 AM

## 2019-02-02 NOTE — Progress Notes (Signed)
Patient found blatantly pocketing food. Patient unable to swallow pocketed food with encouragement. Food removed from mouth by RN. Suctioned patient's mouth and preformed oral care.

## 2019-02-03 ENCOUNTER — Inpatient Hospital Stay (HOSPITAL_COMMUNITY): Payer: Medicare Other

## 2019-02-03 LAB — CBC
HCT: 31.8 % — ABNORMAL LOW (ref 39.0–52.0)
Hemoglobin: 10.7 g/dL — ABNORMAL LOW (ref 13.0–17.0)
MCH: 30.1 pg (ref 26.0–34.0)
MCHC: 33.6 g/dL (ref 30.0–36.0)
MCV: 89.3 fL (ref 80.0–100.0)
Platelets: 238 10*3/uL (ref 150–400)
RBC: 3.56 MIL/uL — ABNORMAL LOW (ref 4.22–5.81)
RDW: 13.6 % (ref 11.5–15.5)
WBC: 12.9 10*3/uL — ABNORMAL HIGH (ref 4.0–10.5)
nRBC: 0 % (ref 0.0–0.2)

## 2019-02-03 LAB — BASIC METABOLIC PANEL
Anion gap: 10 (ref 5–15)
BUN: 41 mg/dL — ABNORMAL HIGH (ref 8–23)
CO2: 26 mmol/L (ref 22–32)
Calcium: 8.2 mg/dL — ABNORMAL LOW (ref 8.9–10.3)
Chloride: 104 mmol/L (ref 98–111)
Creatinine, Ser: 1.08 mg/dL (ref 0.61–1.24)
GFR calc Af Amer: >60 mL/min (ref >60)
GFR calc non Af Amer: >60 mL/min (ref >60)
Glucose, Bld: 191 mg/dL — ABNORMAL HIGH (ref 70–99)
Potassium: 4.5 mmol/L (ref 3.5–5.1)
Sodium: 140 mmol/L (ref 135–145)

## 2019-02-03 LAB — GLUCOSE, CAPILLARY
Glucose-Capillary: 217 mg/dL — ABNORMAL HIGH (ref 70–99)
Glucose-Capillary: 340 mg/dL — ABNORMAL HIGH (ref 70–99)
Glucose-Capillary: 388 mg/dL — ABNORMAL HIGH (ref 70–99)
Glucose-Capillary: 351 mg/dL — ABNORMAL HIGH (ref 70–99)

## 2019-02-03 LAB — D-DIMER, QUANTITATIVE: D-Dimer, Quant: 1.77 ug/mL-FEU — ABNORMAL HIGH (ref 0.00–0.50)

## 2019-02-03 LAB — C-REACTIVE PROTEIN: CRP: 3 mg/dL — ABNORMAL HIGH (ref <1.0)

## 2019-02-03 MED ORDER — FUROSEMIDE 10 MG/ML IJ SOLN
40.0000 mg | Freq: Once | INTRAMUSCULAR | Status: AC
  Administered 2019-02-03: 40 mg via INTRAVENOUS
  Filled 2019-02-03: qty 4

## 2019-02-03 MED ORDER — POTASSIUM CHLORIDE CRYS ER 20 MEQ PO TBCR: 20.0000 meq | EXTENDED_RELEASE_TABLET | Freq: Once | ORAL | Status: DC

## 2019-02-03 MED ORDER — DEXAMETHASONE 6 MG PO TABS
6.0000 mg | ORAL_TABLET | Freq: Every day | ORAL | Status: DC
  Administered 2019-02-04 – 2019-02-05 (×2): 6 mg via ORAL
  Filled 2019-02-03 (×2): qty 1

## 2019-02-03 NOTE — Progress Notes (Signed)
Objective Swallowing Evaluation: Type of Study: MBS-Modified Barium Swallow Study   Patient Details  Name: Jesus [name] MRN: [id number] Date of Birth: [birth year]  Today's Date: 02/03/2019 Time: SLP Start Time (ACUTE ONLY): 1410 -SLP Stop Time (ACUTE ONLY): 1457  SLP Time Calculation (min) (ACUTE ONLY): 47 min   Past Medical History:  Past Medical History:  Diagnosis Date  . Diabetes mellitus without complication (HCC)   . Hypertension    Past Surgical History:  Past Surgical History:  Procedure Laterality Date  . APPENDECTOMY    . LEFT HEART CATH AND CORONARY ANGIOGRAPHY N/A 10/26/2017   Procedure: LEFT HEART CATH AND CORONARY ANGIOGRAPHY;  Surgeon: Dalia Heading, MD;  Location: [facility] INVASIVE CV LAB;  Service: Cardiovascular;  Laterality: N/A;   HPI:  84 year old male who presented with dyspnea and altered mental status.  He does have significant past medical history of type 2 diabetes mellitus, hypertension and dementia.  Not feeling well for about 9 days, with decrease in cognitive function and generalized weakness, had one emergency department visit on September 28, eventually discharge home from the ED, October 5 tested positive for SARS COVID-19.  At home he continued to have rapid decline in his cognitive function.  Patient was admitted to the hospital working diagnosis of SARS COVID-19 viral pneumonia complicated by metabolic encephalopathy. He has been observed to cough when he eats and drinks.    Subjective: alert, confused    Assessment / Plan / Recommendation  CHL IP CLINICAL IMPRESSIONS 02/03/2019  Clinical Impression Pt presented with mild oropharyngeal dysphagia.  (Images of study not yet loaded into system at time of this documentation).  Pt was alert and participatory during assessment.   Oral phase was functional with active bolus manipulation, no oral holding.  Solids were transferred into pharynx and cleared through UES rapidly with no residue remaining.  Thin  liquids were observed to penetrate the larynx before the swallow, with trace amounts reaching vocal folds and just underneath.  Trace aspiration of thin liquids elicited a consistent cough response. There was no aspiration of nectar thick liquids.  Brief screen of esophagus at end of study revealed back flow of barium into proximal esophagus.  For now, recommend advancing diet to dysphagia 2; continue nectar thick liquids at meals.  Allow sips of thin ice water between meals and after oral care (water protocol) - This should improve pt's overall satisfaction and still maximize safety during meals.  SLP will follow.     SLP Visit Diagnosis Dysphagia, oropharyngeal phase (R13.12)  Attention and concentration deficit following --  Frontal lobe and executive function deficit following --  Impact on safety and function Mild aspiration risk      CHL IP TREATMENT RECOMMENDATION 02/03/2019  Treatment Recommendations Therapy as outlined in treatment plan below     Prognosis 02/03/2019  Prognosis for Safe Diet Advancement Good  Barriers to Reach Goals --  Barriers/Prognosis Comment --    CHL IP DIET RECOMMENDATION 02/03/2019  SLP Diet Recommendations Dysphagia 2 (Fine chop) solids;Nectar thick liquid  Liquid Administration via Cup;Straw  Medication Administration Whole meds with puree  Compensations Slow rate;Small sips/bites  Postural Changes --      CHL IP OTHER RECOMMENDATIONS 02/03/2019  Recommended Consults --  Oral Care Recommendations Oral care BID  Other Recommendations --      CHL IP FOLLOW UP RECOMMENDATIONS 02/03/2019  Follow up Recommendations Skilled Nursing facility      [facility] IP FREQUENCY AND DURATION 02/03/2019  Speech Therapy Frequency (ACUTE  ONLY) min 2x/week  Treatment Duration 2 weeks           CHL IP ORAL PHASE 02/03/2019  Oral Phase WFL  Oral - Pudding Teaspoon --  Oral - Pudding Cup --  Oral - Honey Teaspoon --  Oral - Honey Cup --  Oral - Nectar Teaspoon --   Oral - Nectar Cup --  Oral - Nectar Straw --  Oral - Thin Teaspoon --  Oral - Thin Cup --  Oral - Thin Straw --  Oral - Puree --  Oral - Mech Soft --  Oral - Regular --  Oral - Multi-Consistency --  Oral - Pill --  Oral Phase - Comment --    CHL IP PHARYNGEAL PHASE 02/03/2019  Pharyngeal Phase Impaired  Pharyngeal- Pudding Teaspoon --  Pharyngeal --  Pharyngeal- Pudding Cup --  Pharyngeal --  Pharyngeal- Honey Teaspoon --  Pharyngeal --  Pharyngeal- Honey Cup --  Pharyngeal --  Pharyngeal- Nectar Teaspoon --  Pharyngeal --  Pharyngeal- Nectar Cup Delayed swallow initiation-vallecula  Pharyngeal --  Pharyngeal- Nectar Straw --  Pharyngeal --  Pharyngeal- Thin Teaspoon --  Pharyngeal --  Pharyngeal- Thin Cup Delayed swallow initiation-pyriform sinuses;Penetration/Aspiration before swallow;Trace aspiration;Reduced airway/laryngeal closure  Pharyngeal Material enters airway, passes BELOW cords and not ejected out despite cough attempt by patient  Pharyngeal- Thin Straw Delayed swallow initiation-pyriform sinuses;Penetration/Aspiration before swallow;Reduced airway/laryngeal closure;Trace aspiration  Pharyngeal Material enters airway, passes BELOW cords and not ejected out despite cough attempt by patient  Pharyngeal- Puree WFL  Pharyngeal --  Pharyngeal- Mechanical Soft WFL  Pharyngeal --  Pharyngeal- Regular --  Pharyngeal --  Pharyngeal- Multi-consistency --  Pharyngeal --  Pharyngeal- Pill --  Pharyngeal --  Pharyngeal Comment --     CHL IP CERVICAL ESOPHAGEAL PHASE 02/03/2019  Cervical Esophageal Phase (No Data)  Pudding Teaspoon --  Pudding Cup --  Honey Teaspoon --  Honey Cup --  Nectar Teaspoon --  Nectar Cup --  Nectar Straw --  Thin Teaspoon --  Thin Cup --  Thin Straw --  Puree --  Mechanical Soft --  Regular --  Multi-consistency --  Pill --  Cervical Esophageal Comment --     [name] 02/03/2019, 4:05 PM

## 2019-02-03 NOTE — Progress Notes (Signed)
Physical Therapy Treatment Patient Details Name: Jesus [name] MRN: [id number] DOB: 1935-04-05 Today's Date: 02/03/2019    History of Present Illness 84 y/o male w/ hx of dementia, DM, HTN, brought to hospital by family after +COVID test and increasing weakness.    PT Comments    The patient is much more alert and talkative, making jokes about rollerskating in [place] where he grew up. Patient will benefit from SNF for rehab prior to return home.    Follow Up Recommendations  Supervision/Assistance - 24 hour;SNF     Equipment Recommendations  None recommended by PT    Recommendations for Other Services       Precautions / Restrictions Precautions Precautions: Fall Precaution Comments: HOH, watch sats, now On O2    Mobility  Bed Mobility Overal bed mobility: Needs Assistance Bed Mobility: Rolling;Sidelying to Sit Rolling: Min assist Sidelying to sit: Mod assist       General bed mobility comments: multimodal sequencing cues to problem solve motion, needing cues for initiation as well. Assist legs and trunk for sitting upright  Transfers Overall transfer level: Needs assistance Equipment used: Rolling walker (2 wheeled) Transfers: Sit to/from Stand Sit to Stand: Min assist         General transfer comment: assist to rise from bed, cues for use of RW, several steps to recliner, cues for hand placement.  Decreased control of descent.  Ambulation/Gait                 Stairs             Wheelchair Mobility    Modified Rankin (Stroke Patients Only)       Balance   Sitting-balance support: Feet supported Sitting balance-Leahy Scale: Fair     Standing balance support: Bilateral upper extremity supported;During functional activity Standing balance-Leahy Scale: Poor Standing balance comment: reliant on external support                            Cognition Arousal/Alertness: Awake/alert Behavior During Therapy: WFL for tasks  assessed/performed Overall Cognitive Status: History of cognitive impairments - at baseline                                 General Comments: pt awake and talkative, speaking about roller skating, about where he grew up in [place]. very much improved in [place] .      Exercises      General Comments        Pertinent Vitals/Pain Pain Assessment: No/denies pain    Home Living                      Prior Function            PT Goals (current goals can now be found in the care plan section) Progress towards PT goals: Progressing toward goals    Frequency    Min 2X/week      PT Plan Discharge plan needs to be updated    Co-evaluation              AM-PAC PT "6 Clicks" Mobility   Outcome Measure  Help needed turning from your back to your side while in a flat bed without using bedrails?: A Lot Help needed moving from lying on your back to sitting on the side of a flat bed without using bedrails?: A Lot Help  needed moving to and from a bed to a chair (including a wheelchair)?: A Lot Help needed standing up from a chair using your arms (e.g., wheelchair or bedside chair)?: A Lot Help needed to walk in hospital room?: A Lot Help needed climbing 3-5 steps with a railing? : Total 6 Click Score: 11    End of Session Equipment Utilized During Treatment: Oxygen Activity Tolerance: Patient tolerated treatment well Patient left: in chair;with call bell/phone within reach;with chair alarm set Nurse Communication: Mobility status;Precautions PT Visit Diagnosis: Muscle weakness (generalized) (M62.81);Other abnormalities of gait and mobility (R26.89)     Time: 2500-3704 PT Time Calculation (min) (ACUTE ONLY): 14 min  Charges:  $Therapeutic Activity: 8-22 mins                     Tresa Endo PT Acute Rehabilitation Services Pager [phone] Office [phone]    Claretha Cooper 02/03/2019, 5:36 PM

## 2019-02-03 NOTE — Progress Notes (Signed)
  Speech Language Pathology Treatment: Dysphagia  Patient Details Name: Jesus [name] MRN: [id number] DOB: Sep 28, 1934 Today's Date: 02/03/2019 Time: 5400-8676 SLP Time Calculation (min) (ACUTE ONLY): 30 min  Assessment / Plan / Recommendation Clinical Impression  F/u for dysphagia. Notes indicate ongoing pocketing of POs; RN reports the same at breakfast today. Pt pleasant, talkative, with no recall of today's events, not oriented to surroundings/situation.  Assisted pt with lunch meal - he declined all the pureed, hot items from tray, but was agreeable to eating the Magic cup and a nectar shake (consumed 100% of both).  Today, he required assistance loading the spoon, but brought spoon to mouth and manipulated POs orally with no oral holding.  Trials of thin liquids continue to elicit coughing, but there was no coughing observed with dysphagia 1 or nectar thick consistencies.  Will proceed with MBS this afternoon as it was not successful on 10/17.    HPI HPI:  84 year old male who presented with dyspnea and altered mental status.  He does have significant past medical history of type 2 diabetes mellitus, hypertension and dementia.  Not feeling well for about 9 days, with decrease in cognitive function and generalized weakness, had one emergency department visit on September 28, eventually discharge home from the ED, October 5 tested positive for SARS COVID-19.  At home he continued to have rapid decline in his cognitive function.  Patient was admitted to the hospital working diagnosis of SARS COVID-19 viral pneumonia complicated by metabolic encephalopathy. He has been observed to cough when he eats and drinks.       SLP Plan  MBS       Recommendations  Diet recommendations: Dysphagia 1 (puree);Nectar-thick liquid Liquids provided via: Cup;Straw Medication Administration: Whole meds with puree Supervision: Patient able to self feed Compensations: Slow rate;Small sips/bites Postural Changes  and/or Swallow Maneuvers: Seated upright 90 degrees                Oral Care Recommendations: Oral care BID Follow up Recommendations: Skilled Nursing facility SLP Visit Diagnosis: Dysphagia, oropharyngeal phase (R13.12) Plan: MBS       GO                Jesus [name] 02/03/2019, 12:44 PM   Jesus [name], [place] Office number [phone] Pager [phone]

## 2019-02-03 NOTE — Progress Notes (Signed)
Esperance (brother) updated & all questions/concerns addressed.  Barium swallow study eval done today. Dysphagia 2 w/ nectar thick liq recommended. Ice water okay inbetween meals but NOT with. PT got pt up to chair for dinner. Pt ate small amount ofr breakfast & lunch but most of dinner. Hourly safety checks performed. Sacral mepilex placed for protection today 10/19. Pt turned Q2hrs Condom cath remains in place for voiding. No c/o pain.

## 2019-02-03 NOTE — Progress Notes (Signed)
PROGRESS NOTE  Jesus [name] VQM:[id number] DOB: Jan 16, 1935 DOA: 01/22/2019  PCP: Tracie Harrier, MD  Brief History/Interval Summary: 84 year old male who presented with dyspnea and altered mental status. He does have significant past medical history of type 2 diabetes mellitus, hypertension and dementia. Not feeling well for about 9 days, with decrease in cognitive function and generalized weakness, had one emergency department visit on September 28, eventually discharge home from the ED, October 5 tested positive for SARS COVID-19. At home he continued to have rapid decline in his cognitive function. Patient was admitted to the hospital working diagnosis of SARS COVID-19 viral pneumonia complicated by metabolic encephalopathy.  Reason for Visit: Acute respiratory failure with hypoxia.  Pneumonia due to COVID-19.  Consultants: None  Procedures: None  Antibiotics: Anti-infectives (From admission, onward)   Start     Dose/Rate Route Frequency Ordered Stop   01/26/19 1000  remdesivir 100 mg in sodium chloride 0.9 % 250 mL IVPB     100 mg 500 mL/hr over 30 Minutes Intravenous Every 24 hours 01/25/19 1216 01/29/19 0903   01/25/19 1400  remdesivir 200 mg in sodium chloride 0.9 % 250 mL IVPB     200 mg 500 mL/hr over 30 Minutes Intravenous Once 01/25/19 1216 01/25/19 1545      Subjective/Interval History: Patient not very communicative.  Denies any complaints.  Specifically no shortness of breath.  Per nursing staff he tends to keep his food in the mouth.  Speech therapy is following.   Assessment/Plan:  Acute Hypoxic Resp. Failure/Pneumonia due to COVID-19  COVID-19 Labs  Recent Labs    02/01/19 0047 02/02/19 0047 02/03/19 0525  DDIMER 2.05*  --  1.77*  CRP 9.8* 5.5* 3.0*    Positive COVID-19 results available on care everywhere from 10/5.  Fever: Afebrile Oxygen requirements: Pains on nasal cannula.  2 L/min.  Saturating in the 90s.   Antibacterials: None  Remdesivir: Completed a 5-day course on 10/14 Steroids: Dexamethasone 6 mg twice daily Diuretics: Lasix has been given daily for the last 3 days.  Seems to have helped his oxygenation. Actemra: None given yet Vitamin C and Zinc: Continue DVT Prophylaxis:  Lovenox 40 mg twice daily  Patient's respiratory status appears to have improved.  His oxygenation has improved.  O2 has been decreased to 2 L.  It seems that the Lasix has helped.  We will give additional dose today.  He has completed course of Remdesivir.  He remains on steroids.  Start tapering steroids.  Inflammatory markers have improved.  D-dimer improved to 1.77.  He will need to go to skilled nursing facility for short-term rehab.  Concern for aspiration and so speech therapy was consulted.  Plan is for a modified barium swallow today.  Acute metabolic encephalopathy in the setting of dementia Mental status seems to be stable and possibly at baseline.  Continue donepezil.  No behavioral disturbances noted.     Diabetes mellitus type 2 uncontrolled with hyperglycemia Hyperglycemia is most likely due to steroids.  HbA1c 6.7.  Continue SSI.  CBGs have been fluctuating a lot.  Continue current dose of Lantus as he did have hypoglycemia a couple of days ago.  Will not be too aggressive with glycemic control.    Essential hypertension Blood pressure is reasonably well controlled.  Continue amlodipine.     Acute kidney injury normal anion gap metabolic acidosis Creatinine apparently had worsened to 1.45 on 10/10.  Renal function is now back to baseline.  Bicarbonate level is normal.  Lasix being given to maintain him in negative fluid balance.  Monitor electrolytes.  Normocytic anemia No evidence of overt bleeding.  Hemoglobin has been stable.  Goals of care Discussions being held with patient's power of attorney who is his brother on a daily basis. CODE STATUS was discussed.  He says no intubation no mechanical ventilation but one-time  cardiac resuscitative efforts should be okay to do.  He was changed to partial code.   DVT Prophylaxis: Lovenox PUD Prophylaxis: Pepcid Code Status: Partial code Family Communication: Brother, who is the legal guardian, being updated on a daily basis Disposition Plan: Brother mentioned that he will be unable to take care of the patient at home so patient does not have 24-hour supervision.  He will need to go to skilled nursing facility for short-term rehab first.  Social worker consulted.   Medications:  Scheduled: . amLODipine  10 mg Oral Once per day on Sun Wed  . aspirin  81 mg Oral Daily  . calcium-vitamin D  1 tablet Oral Daily  . clopidogrel  75 mg Oral Daily  . dexamethasone (DECADRON) injection  6 mg Intravenous Q12H  . donepezil  20 mg Oral QHS  . enoxaparin (LOVENOX) injection  40 mg Subcutaneous Q12H  . famotidine  20 mg Oral Daily  . insulin aspart  0-15 Units Subcutaneous TID WC  . insulin aspart  6 Units Subcutaneous TID WC  . insulin glargine  15 Units Subcutaneous Daily  . multivitamin  1 tablet Oral QHS  . omega-3 acid ethyl esters  2 g Oral Daily  . sodium chloride flush  10-40 mL Intracatheter Q12H  . sodium chloride flush  3 mL Intravenous Q12H  . tamsulosin  0.8 mg Oral QHS  . vitamin C  500 mg Oral Daily  . zinc sulfate  220 mg Oral Daily   Continuous: . sodium chloride     GUY:QIHKVQ chloride, acetaminophen, chlorpheniramine-HYDROcodone, guaiFENesin-dextromethorphan, ondansetron **OR** ondansetron (ZOFRAN) IV, Resource ThickenUp Clear, sodium chloride flush, sodium chloride flush   Objective:  Vital Signs  Vitals:   02/02/19 2000 02/03/19 0400 02/03/19 0830 02/03/19 0837  BP: 135/69 129/66  139/66  Pulse: 82 (!) 59  60  Resp:      Temp: 97.6 F (36.4 C) 98.2 F (36.8 C)  97.7 F (36.5 C)  TempSrc: Oral Oral  Axillary  SpO2: 97% 98% 96% 99%  Weight:      Height:        Intake/Output Summary (Last 24 hours) at 02/03/2019 0959 Last data  filed at 02/03/2019 0008 Gross per 24 hour  Intake 360 ml  Output 1300 ml  Net -940 ml   Filed Weights   01/22/19 2048  Weight: 71.1 kg    General appearance: Awake alert.  In no distress.  Distracted Resp: Normal effort at rest.  Coarse breath sounds bilaterally.  Few crackles at the bases.  Improved aeration.  No wheezing or rhonchi.   Cardio: S1-S2 is normal regular.  No S3-S4.  No rubs murmurs or bruit GI: Abdomen is soft.  Nontender nondistended.  Bowel sounds are present normal.  No masses organomegaly Extremities: No edema.  Neurologic:  No focal neurological deficits.     Lab Results:  Data Reviewed: I have personally reviewed following labs and imaging studies  CBC: Recent Labs  Lab 01/31/19 0037 02/03/19 0525  WBC 11.3* 12.9*  HGB 10.1* 10.7*  HCT 29.4* 31.8*  MCV 88.0 89.3  PLT 254 238    Basic Metabolic  Panel: Recent Labs  Lab 01/30/19 0115 01/31/19 0037 02/01/19 0047 02/01/19 1730 02/02/19 0047 02/02/19 1810 02/03/19 0525  NA 137 137 140  --  139  --  140  K 3.2* 3.9 3.6  --  3.9  --  4.5  CL 104 106 104  --  103  --  104  CO2 23 23 26   --  24  --  26  GLUCOSE 184* 226* 60* 425* 189* 470* 191*  BUN 29* 33* 29*  --  36*  --  41*  CREATININE 1.14 1.09 1.07  --  1.19  --  1.08  CALCIUM 8.1* 8.0* 8.4*  --  8.3*  --  8.2*  MG 2.0  --   --   --   --   --   --     GFR: Estimated Creatinine Clearance: 49.3 mL/min (by C-G formula based on SCr of 1.08 mg/dL).  Liver Function Tests: Recent Labs  Lab 01/28/19 0055 01/29/19 0120  AST 106* 29  ALT 81* 47*  ALKPHOS 78 62  BILITOT 0.8 1.2  PROT 5.2* 5.3*  ALBUMIN 2.6* 2.6*     CBG: Recent Labs  Lab 02/02/19 0823 02/02/19 1113 02/02/19 1653 02/02/19 2115 02/03/19 0841  GLUCAP 260* 342* 435* 258* 217*     No results found for this or any previous visit (from the past 240 hour(s)).    Radiology Studies: No results found.     LOS: 12 days   Waldine Zenz [employer] EnergyKrishnan  [facility]  Pager on [email or web address]  02/03/2019, 9:59 AM

## 2019-02-04 LAB — BASIC METABOLIC PANEL
Anion gap: 10 (ref 5–15)
BUN: 38 mg/dL — ABNORMAL HIGH (ref 8–23)
CO2: 27 mmol/L (ref 22–32)
Calcium: 7.8 mg/dL — ABNORMAL LOW (ref 8.9–10.3)
Chloride: 97 mmol/L — ABNORMAL LOW (ref 98–111)
Creatinine, Ser: 1.11 mg/dL (ref 0.61–1.24)
GFR calc Af Amer: >60 mL/min (ref >60)
GFR calc non Af Amer: >60 mL/min (ref >60)
Glucose, Bld: 206 mg/dL — ABNORMAL HIGH (ref 70–99)
Potassium: 3.8 mmol/L (ref 3.5–5.1)
Sodium: 134 mmol/L — ABNORMAL LOW (ref 135–145)

## 2019-02-04 LAB — GLUCOSE, CAPILLARY
Glucose-Capillary: 80 mg/dL (ref 70–99)
Glucose-Capillary: 208 mg/dL — ABNORMAL HIGH (ref 70–99)
Glucose-Capillary: 144 mg/dL — ABNORMAL HIGH (ref 70–99)
Glucose-Capillary: 148 mg/dL — ABNORMAL HIGH (ref 70–99)

## 2019-02-04 MED ORDER — DEXAMETHASONE 2 MG PO TABS: ORAL_TABLET | ORAL | 0 refills | Status: AC

## 2019-02-04 MED ORDER — RESOURCE THICKENUP CLEAR PO POWD: ORAL | Status: AC

## 2019-02-04 MED ORDER — FAMOTIDINE 20 MG PO TABS: 20.0000 mg | ORAL_TABLET | Freq: Every day | ORAL | 0 refills | Status: AC

## 2019-02-04 MED ORDER — GUAIFENESIN-DM 100-10 MG/5ML PO SYRP: 10.0000 mL | ORAL_SOLUTION | ORAL | 0 refills | Status: AC | PRN

## 2019-02-04 NOTE — Discharge Instructions (Signed)

## 2019-02-04 NOTE — [place] FL2 (Signed)
[place] MEDICAID FL2 LEVEL OF CARE SCREENING TOOL     IDENTIFICATION  Patient Name: Jesus [name] Birthdate: 1935-03-03 Sex: male Admission Date (Current Location): 01/22/2019  [facility] and [place] Number:  Producer, television/film/video and Address:  [facility] [place], [address] [place], [place] [address]([address]      Provider Number: [id number]  Attending Physician Name and Address:  Osvaldo Shipper, MD  Relative Name and Phone Number:  Jiovany Scheffel -guardian - [phone]    Current Level of Care: Hospital Recommended Level of Care: Skilled Nursing Facility Prior Approval Number:    Date Approved/Denied:   PASRR Number:    Discharge Plan: SNF    Current Diagnoses: Patient Active Problem List   Diagnosis Date Noted  . Viral pneumonia 01/22/2019  . Pneumonia due to COVID-19 virus 01/22/2019  . Dementia without behavioral disturbance (HCC) 01/22/2019  . Encephalopathy due to COVID-19 virus 01/22/2019  . DM (diabetes mellitus) (HCC) 01/22/2019  . HTN (hypertension) 01/22/2019  . NSTEMI (non-ST elevated myocardial infarction) (HCC) 10/22/2017    Orientation RESPIRATION BLADDER Height & Weight     Self  O2 Incontinent Weight: 71.1 kg Height:  5\' 7"  (170.2 cm)  BEHAVIORAL SYMPTOMS/MOOD NEUROLOGICAL BOWEL NUTRITION STATUS      Incontinent Diet  AMBULATORY STATUS COMMUNICATION OF NEEDS Skin   Limited Assist Verbally Normal                       Personal Care Assistance Level of Assistance  Total care       Total Care Assistance: Maximum assistance   Functional Limitations Info             SPECIAL CARE FACTORS FREQUENCY  PT (By licensed PT), OT (By licensed OT)     PT Frequency: 5x/week OT Frequency: min 3x/week            Contractures Contractures Info: Not present    Additional Factors Info  Code Status Code Status Info: Partial Code             Current Medications (02/04/2019):  This is the current  hospital active medication list Current Facility-Administered Medications  Medication Dose Route Frequency Provider Last Rate Last Dose  . 0.9 %  sodium chloride infusion  250 mL Intravenous PRN 02/06/2019, MD      . acetaminophen (TYLENOL) tablet 650 mg  650 mg Oral Q6H PRN Haydee Monica, MD      . amLODipine (NORVASC) tablet 10 mg  10 mg Oral Once per day on Sun Wed Arrien, Mauricio Daniel, MD   10 mg at 02/02/19 0908  . aspirin chewable tablet 81 mg  81 mg Oral Daily Arrien, 02/04/19, MD   81 mg at 02/03/19 0935  . calcium-vitamin D (OSCAL WITH D) 500-200 MG-UNIT per tablet 1 tablet  1 tablet Oral Daily Arrien, 02/05/19, MD   1 tablet at 02/03/19 0936  . chlorpheniramine-HYDROcodone (TUSSIONEX) 10-8 MG/5ML suspension 5 mL  5 mL Oral Q12H PRN 02/05/19, MD      . clopidogrel (PLAVIX) tablet 75 mg  75 mg Oral Daily Arrien, Haydee Monica, MD   75 mg at 02/04/19 0910  . dexamethasone (DECADRON) tablet 6 mg  6 mg Oral Daily 02/06/19, MD      . donepezil (ARICEPT) tablet 20 mg  20 mg Oral QHS Arrien, Osvaldo Shipper, MD   20 mg at 02/03/19 2122  .  enoxaparin (LOVENOX) injection 40 mg  40 mg Subcutaneous Q12H Bonnielee Haff, MD   40 mg at 02/04/19 0909  . famotidine (PEPCID) tablet 20 mg  20 mg Oral Daily Bonnielee Haff, MD   20 mg at 02/03/19 0937  . guaiFENesin-dextromethorphan (ROBITUSSIN DM) 100-10 MG/5ML syrup 10 mL  10 mL Oral Q4H PRN Derrill Kay A, MD      . insulin aspart (novoLOG) injection 0-15 Units  0-15 Units Subcutaneous TID WC Arrien, Jimmy Picket, MD   15 Units at 02/03/19 1746  . insulin aspart (novoLOG) injection 6 Units  6 Units Subcutaneous TID WC Bonnielee Haff, MD   6 Units at 02/03/19 1746  . insulin glargine (LANTUS) injection 15 Units  15 Units Subcutaneous Daily Bonnielee Haff, MD   15 Units at 02/04/19 0909  . multivitamin (PROSIGHT) tablet 1 tablet  1 tablet Oral QHS Arrien, Jimmy Picket, MD   1 tablet at 02/03/19 2122  .  omega-3 acid ethyl esters (LOVAZA) capsule 2 g  2 g Oral Daily Arrien, Jimmy Picket, MD   2 g at 02/03/19 [phone]  . ondansetron (ZOFRAN) tablet 4 mg  4 mg Oral Q6H PRN Phillips Grout, MD       Or  . ondansetron (ZOFRAN) injection 4 mg  4 mg Intravenous Q6H PRN Derrill Kay A, MD      . potassium chloride SA (KLOR-CON) CR tablet 20 mEq  20 mEq Oral Once Bonnielee Haff, MD      . Resource ThickenUp Clear   Oral PRN Bonnielee Haff, MD      . sodium chloride flush (NS) 0.9 % injection 10-40 mL  10-40 mL Intracatheter Q12H Bonnielee Haff, MD   10 mL at 02/03/19 2122  . sodium chloride flush (NS) 0.9 % injection 10-40 mL  10-40 mL Intracatheter PRN Bonnielee Haff, MD      . sodium chloride flush (NS) 0.9 % injection 3 mL  3 mL Intravenous Q12H Derrill Kay A, MD   3 mL at 02/03/19 2122  . sodium chloride flush (NS) 0.9 % injection 3 mL  3 mL Intravenous PRN Derrill Kay A, MD      . tamsulosin [facility]) capsule 0.8 mg  0.8 mg Oral QHS Arrien, Jimmy Picket, MD   0.8 mg at 02/03/19 2122  . vitamin C (ASCORBIC ACID) tablet 500 mg  500 mg Oral Daily Derrill Kay A, MD   500 mg at 02/04/19 0910  . zinc sulfate capsule 220 mg  220 mg Oral Daily Phillips Grout, MD   220 mg at 02/04/19 1443     Discharge Medications: Please see discharge summary for a list of discharge medications.  Relevant Imaging Results:  Relevant Lab Results:   Additional Information SSN [id number]  Ninfa Meeker, RN

## 2019-02-04 NOTE — Discharge Summary (Signed)
[facility]  Physician Discharge Summary   Patient ID: Jesus [name] MRN: [id number] DOB/AGE: 1934/04/25 84 y.o.  Admit date: 01/22/2019 Discharge date: 02/04/2019  PCP: Tracie Harrier, MD  DISCHARGE DIAGNOSES:  Acute hypoxic respiratory failure Pneumonia due to [id number] Acute metabolic encephalopathy, resolved History of dementia, stable Diabetes mellitus type 2, uncontrolled with hyperglycemia Essential hypertension Acute kidney injury, resolved Normocytic anemia   RECOMMENDATIONS FOR OUTPATIENT FOLLOW UP: 1. Patient will need supplemental oxygen at 2 to 3 L/min by nasal cannula.  Maintain saturations above 90% 2. Check CBC and basic metabolic panel in 1 week 3. Recommend palliative medicine consult at the skilled nursing facility 4. Monitor CBGs    Home Health: Patient going to skilled nursing facility Equipment/Devices: None  CODE STATUS: Limited resuscitation: No intubation or mechanical ventilation.  No BiPAP.  But okay to try CPR, pressors, defibrillation, antiarrhythmic treatments  DISCHARGE CONDITION: fair  Diet recommendation: Dysphagia 2 diet with nectar thick liquids  INITIAL HISTORY: 84 year old male who presented with dyspnea and altered mental status. He does have significant past medical history of type 2 diabetes mellitus, hypertension and dementia. Not feeling well for about 9 days, with decrease in cognitive functionandgeneralized weakness, had oneemergency department visit on September 28, eventually discharge home from the ED, October 5 tested positive for SARS COVID-19. At home he continued to have rapid decline in his cognitive function. Patient was admitted to the hospital working diagnosis of SARS COVID-19 viral pneumonia complicated by metabolic encephalopathy.   HOSPITAL COURSE:   Acute Hypoxic Resp. Failure/Pneumonia due to COVID-19 Patient was hospitalized.  Patient was started on remdesivir and steroids.  Patient started  improving.  He was also given Lasix during this hospitalization to maintain him in negative fluid balance.  He still requiring about 2 to 3 L of oxygen.  Inflammatory markers have improved with CRP down to 3.0.  D-dimer is also improved.  There was some concern for aspiration so speech therapy was consulted.  They did a modified barium swallow.  He will be on a dysphagia 2 diet with nectar thick liquids.  Tapering doses of steroids to continue.  Palliative medicine consult at the skilled nursing facility is recommended.  Acute metabolic encephalopathy in the setting of dementia Mental status seems to be stable and possibly at baseline.  Continue donepezil.  No behavioral disturbances noted.     Diabetes mellitus type 2 uncontrolled with hyperglycemia Hyperglycemia is most likely due to steroids.  HbA1c 6.7.    CBG should improve as steroid is tapered down.  He may continue with his home medication regimen.    Essential hypertension Blood pressure is reasonably well controlled.  Continue amlodipine.     Acute kidney injury normal anion gap metabolic acidosis Creatinine apparently had worsened to 1.45 on 10/10.  Renal function is now back to baseline.   Normocytic anemia No evidence of overt bleeding.  Hemoglobin has been stable.  Goals of care Discussions being held with patient's power of attorney who is his brother on a daily basis.   Patient is a limited resuscitation.  See MOST form.  Would recommend palliative medicine consult at skilled nursing facility.  Overall stable.  Okay for discharge to skilled nursing facility today.  Patient's brother is not able to provide around-the-clock supervision at home.  Patient will benefit from short-term rehab.   PERTINENT LABS:  The results of significant diagnostics from this hospitalization (including imaging, microbiology, ancillary and laboratory) are listed below for reference.  Labs:  COVID-19 Labs  Recent Labs     02/02/19 0047 02/03/19 0525  DDIMER  --  1.77*  CRP 5.5* 3.0*    Positive COVID-19 results available on care everywhere from 10/5.    Basic Metabolic Panel: Recent Labs  Lab 01/30/19 0115 01/31/19 0037 02/01/19 0047 02/01/19 1730 02/02/19 0047 02/02/19 1810 02/03/19 0525  NA 137 137 140  --  139  --  140  K 3.2* 3.9 3.6  --  3.9  --  4.5  CL 104 106 104  --  103  --  104  CO2 --  24  --  26  GLUCOSE 184* 226* 60* 425* 189* 470* 191*  BUN 29* 33* 29*  --  36*  --  41*  CREATININE 1.14 1.09 1.07  --  1.19  --  1.08  CALCIUM 8.1* 8.0* 8.4*  --  8.3*  --  8.2*  MG 2.0  --   --   --   --   --   --    Liver Function Tests: Recent Labs  Lab 01/29/19 0120  AST 29  ALT 47*  ALKPHOS 62  BILITOT 1.2  PROT 5.3*  ALBUMIN 2.6*   CBC: Recent Labs  Lab 01/31/19 0037 02/03/19 0525  WBC 11.3* 12.9*  HGB 10.1* 10.7*  HCT 29.4* 31.8*  MCV 88.0 89.3  PLT 254 238    CBG: Recent Labs  Lab 02/03/19 0841 02/03/19 1103 02/03/19 1602 02/03/19 2118 02/04/19 0742  GLUCAP 217* 340* 388* 351* 80     IMAGING STUDIES Ct Head Wo Contrast  Result Date: 01/13/2019 CLINICAL DATA:  Altered mental status EXAM: CT HEAD WITHOUT CONTRAST TECHNIQUE: Contiguous axial images were obtained from the base of the skull through the vertex without intravenous contrast. COMPARISON:  None. FINDINGS: Brain: No evidence of acute infarction, hemorrhage, hydrocephalus, extra-axial collection or mass lesion/mass effect. Remote lacunar infarcts within the left basal ganglia. Scattered low-density changes within the periventricular and subcortical white matter compatible with chronic microvascular ischemic change. Mild diffuse cerebral volume loss. Vascular: Mild atherosclerotic calcifications involving the large vessels of the skull base. No unexpected hyperdense vessel. Skull: Normal. Negative for fracture or focal lesion. Sinuses/Orbits: No acute finding. Other: None. IMPRESSION: 1.  No acute  intracranial findings. 2.  Chronic microvascular ischemic change and cerebral volume loss. Electronically Signed   By: Duanne Guess M.D.   On: 01/13/2019 11:46   Ct Angio Chest Pe W Or Wo Contrast  Result Date: 01/28/2019 CLINICAL DATA:  84 year old male with concern for pulmonary embolism. Positive D-dimer. COVID-19 positive. EXAM: CT ANGIOGRAPHY CHEST WITH CONTRAST TECHNIQUE: Multidetector CT imaging of the chest was performed using the standard protocol during bolus administration of intravenous contrast. Multiplanar CT image reconstructions and MIPs were obtained to evaluate the vascular anatomy. CONTRAST:  OMNIPAQUE IOHEXOL 350 MG/ML SOLN COMPARISON:  Chest radiograph dated 01/22/2019. FINDINGS: Cardiovascular: There is mild cardiomegaly. No pericardial effusion. Multi vessel coronary vascular calcification with involvement of the LAD, RCA, and left circumflex artery. Moderate atherosclerotic calcification of the thoracic aorta. No aneurysmal dilatation or dissection. There is diminutive appearance of the left vertebral artery. The origins of the remainder of the visualized great vessels of the aortic arch appear patent. Evaluation of the pulmonary arteries is somewhat limited due to respiratory motion artifact and suboptimal opacification and visualization of the peripheral branches. No pulmonary artery embolus identified. Mediastinum/Nodes: There is no hilar or mediastinal adenopathy. The esophagus is grossly unremarkable.  Several small bilateral hypodense thyroid nodules noted. Ultrasound may provide better evaluation. No mediastinal fluid collection. Lungs/Pleura: Bilateral patchy airspace opacities noted. Overall interval progression of the airspace opacities compared to the radiograph of 01/22/2019 when accounting for difference in technique. There are a spectrum of findings in the lungs which can be seen with acute atypical infection (as well as other non-infectious etiologies). In  particular, viral pneumonia (including COVID-19) should be considered in the appropriate clinical setting. Small bilateral pleural effusions noted. There is no pneumothorax. The central airways are patent. Upper Abdomen: No acute abnormality. Musculoskeletal: Osteopenia with degenerative changes of the spine. No acute osseous pathology. Left pectoral pacemaker device noted. Review of the MIP images confirms the above findings. IMPRESSION: 1. No CT evidence of pulmonary embolism. 2. Bilateral patchy airspace opacities as well as small bilateral pleural effusions. Overall interval progression of the airspace opacities compared to the radiograph of 01/22/2019 when accounting for difference in technique. There are a spectrum of findings in the lungs which can be seen with acute atypical infection (as well as other non-infectious etiologies). In particular, viral pneumonia (including COVID) should be considered in the appropriate clinical setting. Aortic Atherosclerosis (ICD10-I70.0). Electronically Signed   By: Elgie Collard M.D.   On: 01/28/2019 17:11   Dg Chest Port 1 View  Result Date: 01/31/2019 CLINICAL DATA:  COVID-19 pneumonia, dyspnea EXAM: PORTABLE CHEST 1 VIEW COMPARISON:  01/22/2019 chest radiograph. 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 Stable cardiomediastinal silhouette with top-normal heart size. No pneumothorax. No pleural effusion. Extensive patchy opacities throughout the peripheral lungs bilaterally, substantially worsened. IMPRESSION: Substantial worsening of extensive patchy lung opacities throughout the peripheral lungs bilaterally, compatible with COVID-19 pneumonia. Electronically Signed   By: Delbert Phenix M.D.   On: 01/31/2019 13:05   Dg Chest Port 1 View  Result Date: 01/22/2019 CLINICAL DATA:  Covid positive. EXAM: PORTABLE CHEST 1 VIEW COMPARISON:  01/13/2019 FINDINGS: 1237 hours. Low lung volumes. Hazy airspace opacity at the right base is new in the  interval. No pleural effusion. The cardiopericardial silhouette is within normal limits for size. Left-sided permanent pacemaker noted. Telemetry leads overlie the chest. IMPRESSION: New hazy airspace opacity at the right base compatible with pneumonia. Electronically Signed   By: Kennith Center M.D.   On: 01/22/2019 13:52   Dg Chest Portable 1 View  Result Date: 01/13/2019 CLINICAL DATA:  Altered mental status, CHF.  Evaluate for pneumonia. EXAM: PORTABLE CHEST 1 VIEW COMPARISON:  10/27/2017 FINDINGS: Left pacer in place, unchanged. Mild cardiomegaly. No confluent opacities, effusions or edema. No acute bony abnormality. IMPRESSION: Cardiomegaly.  No active disease. Electronically Signed   By: Charlett Nose M.D.   On: 01/13/2019 09:43   Vas [place] Lower Extremity Venous (dvt)  Result Date: 01/29/2019  Lower Venous Study Indications: Edema. Other Indications: COVID. Anticoagulation: Enoxaparin. Performing Technologist: Leta Jungling RDCS  Examination Guidelines: A complete evaluation includes B-mode imaging, spectral Doppler, color Doppler, and power Doppler as needed of all accessible portions of each vessel. Bilateral testing is considered an integral part of a complete examination. Limited examinations for reoccurring indications may be performed as noted.  +---------+---------------+---------+-----------+----------+--------------+  RIGHT     Compressibility Phasicity Spontaneity Properties Thrombus Aging  +---------+---------------+---------+-----------+----------+--------------+  CFV       Full            Yes       Yes                                    +---------+---------------+---------+-----------+----------+--------------+  FV Prox   Full                                                             +---------+---------------+---------+-----------+----------+--------------+  FV Mid    Full                                                              +---------+---------------+---------+-----------+----------+--------------+  FV Distal Full                                                             +---------+---------------+---------+-----------+----------+--------------+  POP                                                        Not visualized  +---------+---------------+---------+-----------+----------+--------------+  PTV                                                        Not visualized  +---------+---------------+---------+-----------+----------+--------------+  PERO                                                       Not visualized  +---------+---------------+---------+-----------+----------+--------------+   +---------+---------------+---------+-----------+----------+-------------------+  LEFT      Compressibility Phasicity Spontaneity Properties Thrombus Aging       +---------+---------------+---------+-----------+----------+-------------------+  CFV                       Yes       Yes                    unable to fully                                                                  compress. Patient                                                                flow visualized  with color and                                                                   spectral doppler     +---------+---------------+---------+-----------+----------+-------------------+  FV Prox   Full                                                                  +---------+---------------+---------+-----------+----------+-------------------+  FV Mid    Full                                                                  +---------+---------------+---------+-----------+----------+-------------------+  FV Distal Full                                                                  +---------+---------------+---------+-----------+----------+-------------------+  POP       Full            Yes       Yes                                          +---------+---------------+---------+-----------+----------+-------------------+  PTV                                                        Not visualized       +---------+---------------+---------+-----------+----------+-------------------+  PERO                                                       Not visualized       +---------+---------------+---------+-----------+----------+-------------------+     Summary: Right: There is no evidence of deep vein thrombosis in the lower extremity. However, portions of this examination were limited- see technologist comments above. Very difficult study du to patients altered mental status. Unable to visualize Pop V, PTV, and Peroneal V. Left: There is no evidence of deep vein thrombosis in the lower extremity. However, portions of this examination were limited- see technologist comments above. No cystic structure found in the popliteal fossa. Unable to visualize the PTV and Pero V.  *See table(s) above for measurements and observations. Electronically signed by Gretta Began MD on 01/29/2019 at 3:06:59 PM.  Final     DISCHARGE EXAMINATION: Vitals:   02/03/19 2020 02/03/19 2044 02/04/19 0400 02/04/19 0819  BP: (!) 141/78 140/60 122/66 135/75  Pulse: 83 77 63 81  Resp: 20 18 18 20   Temp:  98 F (36.7 C) (!) 97.4 F (36.3 C) (!) 97.5 F (36.4 C)  TempSrc:  Axillary Axillary Oral  SpO2: 96% 95% 95%   Weight:      Height:       General appearance: Awake alert.  In no distress.  Distracted Resp: Normal effort at rest.  Coarse breath sounds bilaterally.  No wheezing rales or rhonchi.   Cardio: S1-S2 is normal regular.  No S3-S4.  No rubs murmurs or bruit GI: Abdomen is soft.  Nontender nondistended.  Bowel sounds are present normal.  No masses organomegaly    DISPOSITION: Skilled nursing facility  Discharge Instructions    Call MD for:  difficulty breathing, headache or visual disturbances   Complete by: As directed     Call MD for:  extreme fatigue   Complete by: As directed    Call MD for:  persistant dizziness or light-headedness   Complete by: As directed    Call MD for:  persistant nausea and vomiting   Complete by: As directed    Call MD for:  severe uncontrolled pain   Complete by: As directed    Call MD for:  temperature >100.4   Complete by: As directed    Discharge instructions   Complete by: As directed    Please review instructions on the discharge summary.  You were cared for by a hospitalist during your hospital stay. If you have any questions about your discharge medications or the care you received while you were in the hospital after you are discharged, you can call the unit and asked to speak with the hospitalist on call if the hospitalist that took care of you is not available. Once you are discharged, your primary care physician will handle any further medical issues. Please note that NO REFILLS for any discharge medications will be authorized once you are discharged, as it is imperative that you return to your primary care physician (or establish a relationship with a primary care physician if you do not have one) for your aftercare needs so that they can reassess your need for medications and monitor your lab values. If you do not have a primary care physician, you can call [phone] for a physician referral.   Increase activity slowly   Complete by: As directed         Allergies as of 02/04/2019   No Known Allergies     Medication List    STOP taking these medications   isosorbide mononitrate 30 MG 24 hr tablet Commonly known as: IMDUR     TAKE these medications   amLODipine 10 MG tablet Commonly known as: NORVASC Take 10 mg by mouth See admin instructions. Pt takes Sunday and Wednesday morning, a daily dose of this product drops his blood pressure too low as observed by caretaker, pts physician Dr.Vishwanath Hande is aware of regimen.   aspirin 81 MG chewable tablet Chew  81 mg by mouth daily.   CALCIUM 600+D PO Take 1 tablet by mouth daily.   clopidogrel 75 MG tablet Commonly known as: PLAVIX Take 75 mg by mouth daily.   dexamethasone 2 MG tablet Commonly known as: DECADRON Take 2 tablets once daily for 3 days, then 1 tablet once daily for 3 days, then  STOP.   donepezil 10 MG tablet Commonly known as: ARICEPT Take 20 mg by mouth at bedtime.   famotidine 20 MG tablet Commonly known as: PEPCID Take 1 tablet (20 mg total) by mouth daily for 10 days. Start taking on: February 05, 2019   glipiZIDE 10 MG tablet Commonly known as: GLUCOTROL Take 20 mg by mouth 2 (two) times daily before a meal.   guaiFENesin-dextromethorphan 100-10 MG/5ML syrup Commonly known as: ROBITUSSIN DM Take 10 mLs by mouth every 4 (four) hours as needed for cough.   omega-3 acid ethyl esters 1 g capsule Commonly known as: LOVAZA Take 2 g by mouth daily.   pioglitazone 30 MG tablet Commonly known as: ACTOS Take 30 mg by mouth daily.   PreserVision AREDS 2+Multi Vit Caps Take 1 capsule by mouth at bedtime.   Resource ThickenUp Clear Powd Use to thicken fluids   tamsulosin 0.4 MG Caps capsule Commonly known as: FLOMAX Take 0.8 mg by mouth at bedtime.        Follow-up Information    Barbette Reichmann, MD. Schedule an appointment as soon as possible for a visit in 1 week(s).   Specialty: Internal Medicine Contact information: [address] [place] [address] [phone]           TOTAL DISCHARGE TIME: 35 minutes  Grey Schlauch Rito Ehrlich  [facility] Pager on [email or web address]  02/04/2019, 10:43 AM

## 2019-02-05 LAB — GLUCOSE, CAPILLARY
Glucose-Capillary: 217 mg/dL — ABNORMAL HIGH (ref 70–99)
Glucose-Capillary: 234 mg/dL — ABNORMAL HIGH (ref 70–99)

## 2019-02-05 NOTE — Discharge Planning (Signed)
Report called to Lezlie Lye RN at [facility]. All questions & concerns addressed.   Removed pt midline prior to d/c. Condom cath in place. Bath given, clean gown on pt. Family updated.  All pt belongings w/ pt & transport.

## 2019-02-05 NOTE — Progress Notes (Signed)
Pt has d/c order in place & is going to [facility]. Spoke w/ Clinton Killough (legal guardian) he is aware of pt d/c & placement plan. All questions & concerns addressed.  Pt has been doing well on RA 93-98% Condom cath still in place for voiding. **Pt needs assistance w/ feeds & make sure he isn't pocketing foods. Dinner is his favorite meal. Loves pureed peaches,broccoli, potatoes & [place].

## 2019-02-05 NOTE — Care Management Important Message (Signed)
Important Message  Patient Details  Name: Naheim Burgen MRN: [id number] Date of Birth: 07-28-1934   Medicare Important Message Given:  Yes - Important Message mailed due to current National Emergency   Verbal consent obtained due to current National Emergency  Relationship to patient: [place] Name: Clinton Stamant Call Date: 02/05/19  Time: 6282 Phone: [phone] Outcome: Spoke with contact Important Message mailed to: Other (must enter comment)([email or web address])      Thaddaeus Granja 02/05/2019, 11:22 AM

## 2019-02-05 NOTE — Progress Notes (Signed)
  Speech Language Pathology Treatment: Dysphagia  Patient Details Name: Jesus [name] MRN: [id number] DOB: June 21, 1934 Today's Date: 02/05/2019 Time: 7062-3762 SLP Time Calculation (min) (ACUTE ONLY): 11 min  Assessment / Plan / Recommendation Clinical Impression  Pt quite interactive today, joking and smiling.  Now on room air and oxygenating well.  He is tolerating a dysphagia 2 diet with nectar thick liquids with sporadic oral pocketing. Today with good ability to self-feed liquids with no observed s/s of aspiration with thin water.  Requires intermittent verbal cues to consume small sips of water -pt with poor recall of instructions. For potential D/C today to [facility].  Pt will benefit from ongoing SLP f/u to address safety with POs, timing of pharyngeal swallow (if pt able), ensure water protocol is established.  Continue dysphagia 2, nectars at D/C.   HPI HPI:  83 year old male who presented with dyspnea and altered mental status.  He does have significant past medical history of type 2 diabetes mellitus, hypertension and dementia.  Not feeling well for about 9 days, with decrease in cognitive function and generalized weakness, had one emergency department visit on September 28, eventually discharge home from the ED, October 5 tested positive for SARS COVID-19.  At home he continued to have rapid decline in his cognitive function.  Patient was admitted to the hospital working diagnosis of SARS COVID-19 viral pneumonia complicated by metabolic encephalopathy. He has been observed to cough when he eats and drinks.       SLP Plan  Continue with current plan of care       Recommendations  Diet recommendations: Dysphagia 2 (fine chop);Nectar-thick liquid(allow thin water between meals and after oral care) Liquids provided via: Cup;Straw Medication Administration: Whole meds with puree Supervision: Patient able to self feed;Staff to assist with self feeding Compensations: Slow rate;Small  sips/bites Postural Changes and/or Swallow Maneuvers: Seated upright 90 degrees                Oral Care Recommendations: Oral care BID Follow up Recommendations: Skilled Nursing facility SLP Visit Diagnosis: Dysphagia, oropharyngeal phase (R13.12) Plan: Continue with current plan of care       GO                Jesus [name] 02/05/2019, 12:16 PM  Jesus [name] Jesus [name], [place] Office number [phone]

## 2019-02-05 NOTE — TOC Transition Note (Addendum)
Transition of Care [facility]) - CM/SW Discharge Note   Patient Details  Name: Jesus [name] MRN: [id number] Date of Birth: [birth year]  Transition of [facility]) CM/SW Contact:  Ninfa Meeker, RN Phone Number: [phone] (working remotely) 02/05/2019, 9:44 AM   Clinical Narrative:  Case manager has faxed discharge summary , FL2 to [facility]. Patient per MD is ready for discharge. Contacted patient's niece-Melanie, daughter of Clinton Medel- and updated her on discharge plan, provided her with [place]'s number and address. Medical Necessity and face sheet printed to the floor. Bedside RN will need to call report to  [phone], patient will be going to room 807P. Bed will not be available until 1pm.Please notify receiving nurse that family requests all calls be made to [phone], the house phone.  Case manager will arrange for PTAR to get patient at 1pm.   Charge nurse has been updated.      Final next level of care: [place] Barriers to Discharge: Continued Medical Work up   Patient Goals and CMS Choice Patient states their goals for this hospitalization and ongoing recovery are:: Brother wants him better   Choice offered to / list presented to : Sibling  Discharge Placement: [facility]                       Discharge Plan and Services     Post Acute Care Choice: Home Health                        Date [place]: 01/24/19 Time [place]: [place] Representative spoke with at [place]: [place] (SDOH) Interventions     Readmission Risk Interventions No flowsheet data found.

## 2019-02-05 NOTE — Progress Notes (Signed)
[facility]  Physician Discharge Summary   Patient ID: Jesus [name] MRN: [id number] DOB/AGE: Jun 24, 1934 84 y.o.  Admit date: 01/22/2019 Discharge date: 02/04/19 - currently awaiting placement  PCP: Barbette Reichmann, MD  DISCHARGE DIAGNOSES:  Acute hypoxic respiratory failure Pneumonia due to COVID-19 Acute metabolic encephalopathy, resolved History of dementia, stable Diabetes mellitus type 2, uncontrolled with hyperglycemia Essential hypertension Acute kidney injury, resolved Normocytic anemia   RECOMMENDATIONS FOR OUTPATIENT FOLLOW UP: 1. Patient will need supplemental oxygen at 2 to 3 L/min by nasal cannula.  Maintain saturations above 90% 2. Check CBC and basic metabolic panel in 1 week 3. Recommend palliative medicine consult at the skilled nursing facility 4. Monitor CBGs    Home Health: Patient going to skilled nursing facility Equipment/Devices: None  CODE STATUS: Limited resuscitation: No intubation or mechanical ventilation.  No BiPAP.  But okay to try CPR, pressors, defibrillation, antiarrhythmic treatments  DISCHARGE CONDITION: fair  Diet recommendation: Dysphagia 2 diet with nectar thick liquids  INITIAL HISTORY: 84 year old male who presented with dyspnea and altered mental status. He does have significant past medical history of type 2 diabetes mellitus, hypertension and dementia. Not feeling well for about 9 days, with decrease in cognitive functionandgeneralized weakness, had oneemergency department visit on September 28, eventually discharge home from the ED, October 5 tested positive for SARS COVID-19. At home he continued to have rapid decline in his cognitive function. Patient was admitted to the hospital working diagnosis of SARS COVID-19 viral pneumonia complicated by metabolic encephalopathy.   HOSPITAL COURSE:   Acute Hypoxic Resp. Failure/Pneumonia due to COVID-19 Patient was hospitalized.  Patient was started on remdesivir and  steroids.  Patient started improving.  He was also given Lasix during this hospitalization to maintain him in negative fluid balance.  He still requiring about 2 to 3 L of oxygen.  Inflammatory markers have improved with CRP down to 3.0.  D-dimer is also improved.  There was some concern for aspiration so speech therapy was consulted.  They did a modified barium swallow.  He will be on a dysphagia 2 diet with nectar thick liquids.  Tapering doses of steroids to continue.  Palliative medicine consult at the skilled nursing facility is recommended.  Acute metabolic encephalopathy in the setting of dementia Mental status seems to be stable and possibly at baseline.  Continue donepezil.  No behavioral disturbances noted.     Diabetes mellitus type 2 uncontrolled with hyperglycemia Hyperglycemia is most likely due to steroids.  HbA1c 6.7.    CBG should improve as steroid is tapered down.  He may continue with his home medication regimen.    Essential hypertension Blood pressure is reasonably well controlled.  Continue amlodipine.     Acute kidney injury normal anion gap metabolic acidosis Creatinine apparently had worsened to 1.45 on 10/10.  Renal function is now back to baseline.   Normocytic anemia No evidence of overt bleeding.  Hemoglobin has been stable.  Goals of care Discussions being held with patient's power of attorney who is his brother on a daily basis.   Patient is a limited resuscitation.  See MOST form.  Would recommend palliative medicine consult at skilled nursing facility.  Overall stable.  Okay for discharge to skilled nursing facility today.  Patient's brother is not able to provide around-the-clock supervision at home.  Patient will benefit from short-term rehab.   PERTINENT LABS:  The results of significant diagnostics from this hospitalization (including imaging, microbiology, ancillary and laboratory) are listed below for reference.  Labs:  COVID-19  Labs  Recent Labs    02/03/19 0525  DDIMER 1.77*  CRP 3.0*    Positive COVID-19 results available on care everywhere from 10/5.    Basic Metabolic Panel: Recent Labs  Lab 01/30/19 0115 01/31/19 0037 02/01/19 0047 02/01/19 1730 02/02/19 0047 02/02/19 1810 02/03/19 0525 02/04/19 1530  NA 137 137 140  --  139  --  140 134*  K 3.2* 3.9 3.6  --  3.9  --  4.5 3.8  CL 104 106 104  --  103  --  104 97*  CO2 --  24  --  26 27  GLUCOSE 184* 226* 60* 425* 189* 470* 191* 206*  BUN 29* 33* 29*  --  36*  --  41* 38*  CREATININE 1.14 1.09 1.07  --  1.19  --  1.08 1.11  CALCIUM 8.1* 8.0* 8.4*  --  8.3*  --  8.2* 7.8*  MG 2.0  --   --   --   --   --   --   --    Liver Function Tests: No results for input(s): AST, ALT, ALKPHOS, BILITOT, PROT, ALBUMIN in the last 168 hours. CBC: Recent Labs  Lab 01/31/19 0037 02/03/19 0525  WBC 11.3* 12.9*  HGB 10.1* 10.7*  HCT 29.4* 31.8*  MCV 88.0 89.3  PLT 254 238    CBG: Recent Labs  Lab 02/03/19 2118 02/04/19 0742 02/04/19 1154 02/04/19 1813 02/04/19 2134  GLUCAP 351* 80 208* 144* 148*     IMAGING STUDIES Ct Head Wo Contrast  Result Date: 01/13/2019 CLINICAL DATA:  Altered mental status EXAM: CT HEAD WITHOUT CONTRAST TECHNIQUE: Contiguous axial images were obtained from the base of the skull through the vertex without intravenous contrast. COMPARISON:  None. FINDINGS: Brain: No evidence of acute infarction, hemorrhage, hydrocephalus, extra-axial collection or mass lesion/mass effect. Remote lacunar infarcts within the left basal ganglia. Scattered low-density changes within the periventricular and subcortical white matter compatible with chronic microvascular ischemic change. Mild diffuse cerebral volume loss. Vascular: Mild atherosclerotic calcifications involving the large vessels of the skull base. No unexpected hyperdense vessel. Skull: Normal. Negative for fracture or focal lesion. Sinuses/Orbits: No acute finding.  Other: None. IMPRESSION: 1.  No acute intracranial findings. 2.  Chronic microvascular ischemic change and cerebral volume loss. Electronically Signed   By: Duanne Guess M.D.   On: 01/13/2019 11:46   Ct Angio Chest Pe W Or Wo Contrast  Result Date: 01/28/2019 CLINICAL DATA:  84 year old male with concern for pulmonary embolism. Positive D-dimer. COVID-19 positive. EXAM: CT ANGIOGRAPHY CHEST WITH CONTRAST TECHNIQUE: Multidetector CT imaging of the chest was performed using the standard protocol during bolus administration of intravenous contrast. Multiplanar CT image reconstructions and MIPs were obtained to evaluate the vascular anatomy. CONTRAST:  OMNIPAQUE IOHEXOL 350 MG/ML SOLN COMPARISON:  Chest radiograph dated 01/22/2019. FINDINGS: Cardiovascular: There is mild cardiomegaly. No pericardial effusion. Multi vessel coronary vascular calcification with involvement of the LAD, RCA, and left circumflex artery. Moderate atherosclerotic calcification of the thoracic aorta. No aneurysmal dilatation or dissection. There is diminutive appearance of the left vertebral artery. The origins of the remainder of the visualized great vessels of the aortic arch appear patent. Evaluation of the pulmonary arteries is somewhat limited due to respiratory motion artifact and suboptimal opacification and visualization of the peripheral branches. No pulmonary artery embolus identified. Mediastinum/Nodes: There is no hilar or mediastinal adenopathy. The esophagus is grossly unremarkable. Several small bilateral hypodense  thyroid nodules noted. Ultrasound may provide better evaluation. No mediastinal fluid collection. Lungs/Pleura: Bilateral patchy airspace opacities noted. Overall interval progression of the airspace opacities compared to the radiograph of 01/22/2019 when accounting for difference in technique. There are a spectrum of findings in the lungs which can be seen with acute atypical infection (as well as other  non-infectious etiologies). In particular, viral pneumonia (including COVID-19) should be considered in the appropriate clinical setting. Small bilateral pleural effusions noted. There is no pneumothorax. The central airways are patent. Upper Abdomen: No acute abnormality. Musculoskeletal: Osteopenia with degenerative changes of the spine. No acute osseous pathology. Left pectoral pacemaker device noted. Review of the MIP images confirms the above findings. IMPRESSION: 1. No CT evidence of pulmonary embolism. 2. Bilateral patchy airspace opacities as well as small bilateral pleural effusions. Overall interval progression of the airspace opacities compared to the radiograph of 01/22/2019 when accounting for difference in technique. There are a spectrum of findings in the lungs which can be seen with acute atypical infection (as well as other non-infectious etiologies). In particular, viral pneumonia (including COVID) should be considered in the appropriate clinical setting. Aortic Atherosclerosis (ICD10-I70.0). Electronically Signed   By: Elgie CollardArash  Radparvar M.D.   On: 01/28/2019 17:11   Dg Chest Port 1 View  Result Date: 01/31/2019 CLINICAL DATA:  COVID-19 pneumonia, dyspnea EXAM: PORTABLE CHEST 1 VIEW COMPARISON:  01/22/2019 chest radiograph. 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 Stable cardiomediastinal silhouette with top-normal heart size. No pneumothorax. No pleural effusion. Extensive patchy opacities throughout the peripheral lungs bilaterally, substantially worsened. IMPRESSION: Substantial worsening of extensive patchy lung opacities throughout the peripheral lungs bilaterally, compatible with COVID-19 pneumonia. Electronically Signed   By: Delbert PhenixJason A Poff M.D.   On: 01/31/2019 13:05   Dg Chest Port 1 View  Result Date: 01/22/2019 CLINICAL DATA:  Covid positive. EXAM: PORTABLE CHEST 1 VIEW COMPARISON:  01/13/2019 FINDINGS: 1237 hours. Low lung volumes. Hazy airspace opacity  at the right base is new in the interval. No pleural effusion. The cardiopericardial silhouette is within normal limits for size. Left-sided permanent pacemaker noted. Telemetry leads overlie the chest. IMPRESSION: New hazy airspace opacity at the right base compatible with pneumonia. Electronically Signed   By: Kennith CenterEric  Mansell M.D.   On: 01/22/2019 13:52   Dg Chest Portable 1 View  Result Date: 01/13/2019 CLINICAL DATA:  Altered mental status, CHF.  Evaluate for pneumonia. EXAM: PORTABLE CHEST 1 VIEW COMPARISON:  10/27/2017 FINDINGS: Left pacer in place, unchanged. Mild cardiomegaly. No confluent opacities, effusions or edema. No acute bony abnormality. IMPRESSION: Cardiomegaly.  No active disease. Electronically Signed   By: Charlett NoseKevin  Dover M.D.   On: 01/13/2019 09:43   Vas [place] Lower Extremity Venous (dvt)  Result Date: 01/29/2019  Lower Venous Study Indications: Edema. Other Indications: COVID. Anticoagulation: Enoxaparin. Performing Technologist: Leta Junglingiffany Cooper RDCS  Examination Guidelines: A complete evaluation includes B-mode imaging, spectral Doppler, color Doppler, and power Doppler as needed of all accessible portions of each vessel. Bilateral testing is considered an integral part of a complete examination. Limited examinations for reoccurring indications may be performed as noted.  +---------+---------------+---------+-----------+----------+--------------+  RIGHT     Compressibility Phasicity Spontaneity Properties Thrombus Aging  +---------+---------------+---------+-----------+----------+--------------+  CFV       Full            Yes       Yes                                    +---------+---------------+---------+-----------+----------+--------------+  FV Prox   Full                                                             +---------+---------------+---------+-----------+----------+--------------+  FV Mid    Full                                                              +---------+---------------+---------+-----------+----------+--------------+  FV Distal Full                                                             +---------+---------------+---------+-----------+----------+--------------+  POP                                                        Not visualized  +---------+---------------+---------+-----------+----------+--------------+  PTV                                                        Not visualized  +---------+---------------+---------+-----------+----------+--------------+  PERO                                                       Not visualized  +---------+---------------+---------+-----------+----------+--------------+   +---------+---------------+---------+-----------+----------+-------------------+  LEFT      Compressibility Phasicity Spontaneity Properties Thrombus Aging       +---------+---------------+---------+-----------+----------+-------------------+  CFV                       Yes       Yes                    unable to fully                                                                  compress. Patient                                                                flow visualized  with color and                                                                   spectral doppler     +---------+---------------+---------+-----------+----------+-------------------+  FV Prox   Full                                                                  +---------+---------------+---------+-----------+----------+-------------------+  FV Mid    Full                                                                  +---------+---------------+---------+-----------+----------+-------------------+  FV Distal Full                                                                  +---------+---------------+---------+-----------+----------+-------------------+  POP       Full            Yes       Yes                                          +---------+---------------+---------+-----------+----------+-------------------+  PTV                                                        Not visualized       +---------+---------------+---------+-----------+----------+-------------------+  PERO                                                       Not visualized       +---------+---------------+---------+-----------+----------+-------------------+     Summary: Right: There is no evidence of deep vein thrombosis in the lower extremity. However, portions of this examination were limited- see technologist comments above. Very difficult study du to patients altered mental status. Unable to visualize Pop V, PTV, and Peroneal V. Left: There is no evidence of deep vein thrombosis in the lower extremity. However, portions of this examination were limited- see technologist comments above. No cystic structure found in the popliteal fossa. Unable to visualize the PTV and Pero V.  *See table(s) above for measurements and observations. Electronically signed by Gretta Began MD on 01/29/2019 at 3:06:59 PM.  Final     DISCHARGE EXAMINATION: Vitals:   02/04/19 1100 02/04/19 1600 02/04/19 2000 02/05/19 0400  BP:   (!) 128/102 (!) 147/65  Pulse:      Resp:   18 18  Temp:   98 F (36.7 C) 98.3 F (36.8 C)  TempSrc:   Oral Oral  SpO2: 99% 96% 96% 94%  Weight:      Height:       General appearance: Awake alert.  In no distress.  Distracted Resp: Normal effort at rest.  Coarse breath sounds bilaterally.  No wheezing rales or rhonchi.   Cardio: S1-S2 is normal regular.  No S3-S4.  No rubs murmurs or bruit GI: Abdomen is soft.  Nontender nondistended.  Bowel sounds are present normal.  No masses organomegaly    DISPOSITION: Skilled nursing facility  Discharge Instructions    Call MD for:  difficulty breathing, headache or visual disturbances   Complete by: As directed    Call MD for:  extreme fatigue   Complete by: As  directed    Call MD for:  persistant dizziness or light-headedness   Complete by: As directed    Call MD for:  persistant nausea and vomiting   Complete by: As directed    Call MD for:  severe uncontrolled pain   Complete by: As directed    Call MD for:  temperature >100.4   Complete by: As directed    Discharge instructions   Complete by: As directed    Please review instructions on the discharge summary.  You were cared for by a hospitalist during your hospital stay. If you have any questions about your discharge medications or the care you received while you were in the hospital after you are discharged, you can call the unit and asked to speak with the hospitalist on call if the hospitalist that took care of you is not available. Once you are discharged, your primary care physician will handle any further medical issues. Please note that NO REFILLS for any discharge medications will be authorized once you are discharged, as it is imperative that you return to your primary care physician (or establish a relationship with a primary care physician if you do not have one) for your aftercare needs so that they can reassess your need for medications and monitor your lab values. If you do not have a primary care physician, you can call [phone] for a physician referral.   Increase activity slowly   Complete by: As directed         Allergies as of 02/05/2019   No Known Allergies     Medication List    STOP taking these medications   isosorbide mononitrate 30 MG 24 hr tablet Commonly known as: IMDUR     TAKE these medications   amLODipine 10 MG tablet Commonly known as: NORVASC Take 10 mg by mouth See admin instructions. Pt takes Sunday and Wednesday morning, a daily dose of this product drops his blood pressure too low as observed by caretaker, pts physician Dr.Vishwanath Hande is aware of regimen.   aspirin 81 MG chewable tablet Chew 81 mg by mouth daily.   CALCIUM 600+D  PO Take 1 tablet by mouth daily.   clopidogrel 75 MG tablet Commonly known as: PLAVIX Take 75 mg by mouth daily.   dexamethasone 2 MG tablet Commonly known as: DECADRON Take 2 tablets once daily for 3 days, then 1 tablet once daily for 3 days, then STOP.   donepezil  10 MG tablet Commonly known as: ARICEPT Take 20 mg by mouth at bedtime.   famotidine 20 MG tablet Commonly known as: PEPCID Take 1 tablet (20 mg total) by mouth daily for 10 days.   glipiZIDE 10 MG tablet Commonly known as: GLUCOTROL Take 20 mg by mouth 2 (two) times daily before a meal.   guaiFENesin-dextromethorphan 100-10 MG/5ML syrup Commonly known as: ROBITUSSIN DM Take 10 mLs by mouth every 4 (four) hours as needed for cough.   omega-3 acid ethyl esters 1 g capsule Commonly known as: LOVAZA Take 2 g by mouth daily.   pioglitazone 30 MG tablet Commonly known as: ACTOS Take 30 mg by mouth daily.   PreserVision AREDS 2+Multi Vit Caps Take 1 capsule by mouth at bedtime.   Resource ThickenUp Clear Powd Use to thicken fluids   tamsulosin 0.4 MG Caps capsule Commonly known as: FLOMAX Take 0.8 mg by mouth at bedtime.        Follow-up Information    Barbette Reichmann, MD. Schedule an appointment as soon as possible for a visit in 1 week(s).   Specialty: Internal Medicine Contact information: [address] [place] [address] [phone]           TOTAL DISCHARGE TIME: 35 minutes  Azucena Fallen DO  [facility] Pager on [email or web address]  02/05/2019, 8:28 AM

## 2019-02-15 ENCOUNTER — Emergency Department: Payer: Medicare Other

## 2019-02-15 ENCOUNTER — Encounter: Payer: Self-pay | Admitting: Emergency Medicine

## 2019-02-15 ENCOUNTER — Other Ambulatory Visit: Payer: Self-pay

## 2019-02-15 ENCOUNTER — Inpatient Hospital Stay
Admission: EM | Admit: 2019-02-15 | Discharge: 2019-02-16 | DRG: 871 | Disposition: A | Discharge status: Other Acute Inpatient Hospital | Payer: Medicare Other | Attending: Internal Medicine | Admitting: Internal Medicine

## 2019-02-15 DIAGNOSIS — J129 Viral pneumonia, unspecified: Secondary | ICD-10-CM | POA: Diagnosis present

## 2019-02-15 DIAGNOSIS — E860 Dehydration: Secondary | ICD-10-CM | POA: Diagnosis present

## 2019-02-15 DIAGNOSIS — E119 Type 2 diabetes mellitus without complications: Secondary | ICD-10-CM

## 2019-02-15 DIAGNOSIS — I10 Essential (primary) hypertension: Secondary | ICD-10-CM | POA: Diagnosis present

## 2019-02-15 DIAGNOSIS — Z7902 Long term (current) use of antithrombotics/antiplatelets: Secondary | ICD-10-CM

## 2019-02-15 DIAGNOSIS — Z7982 Long term (current) use of aspirin: Secondary | ICD-10-CM

## 2019-02-15 DIAGNOSIS — J9601 Acute respiratory failure with hypoxia: Secondary | ICD-10-CM | POA: Diagnosis present

## 2019-02-15 DIAGNOSIS — U071 COVID-19: Secondary | ICD-10-CM | POA: Diagnosis present

## 2019-02-15 DIAGNOSIS — A419 Sepsis, unspecified organism: Secondary | ICD-10-CM | POA: Diagnosis not present

## 2019-02-15 DIAGNOSIS — N3000 Acute cystitis without hematuria: Principal | ICD-10-CM | POA: Diagnosis present

## 2019-02-15 DIAGNOSIS — E872 Acidosis, unspecified: Secondary | ICD-10-CM | POA: Diagnosis present

## 2019-02-15 DIAGNOSIS — N179 Acute kidney failure, unspecified: Secondary | ICD-10-CM | POA: Diagnosis present

## 2019-02-15 DIAGNOSIS — Z7984 Long term (current) use of oral hypoglycemic drugs: Secondary | ICD-10-CM

## 2019-02-15 DIAGNOSIS — A414 Sepsis due to anaerobes: Secondary | ICD-10-CM | POA: Diagnosis present

## 2019-02-15 DIAGNOSIS — A4159 Other Gram-negative sepsis: Secondary | ICD-10-CM | POA: Diagnosis not present

## 2019-02-15 DIAGNOSIS — E8809 Other disorders of plasma-protein metabolism, not elsewhere classified: Secondary | ICD-10-CM | POA: Diagnosis present

## 2019-02-15 DIAGNOSIS — J1289 Other viral pneumonia: Secondary | ICD-10-CM | POA: Diagnosis present

## 2019-02-15 DIAGNOSIS — F0390 Unspecified dementia without behavioral disturbance: Secondary | ICD-10-CM | POA: Diagnosis present

## 2019-02-15 DIAGNOSIS — K810 Acute cholecystitis: Secondary | ICD-10-CM | POA: Diagnosis present

## 2019-02-15 DIAGNOSIS — R9431 Abnormal electrocardiogram [ECG] [EKG]: Secondary | ICD-10-CM | POA: Diagnosis present

## 2019-02-15 DIAGNOSIS — N390 Urinary tract infection, site not specified: Secondary | ICD-10-CM | POA: Diagnosis present

## 2019-02-15 DIAGNOSIS — E1165 Type 2 diabetes mellitus with hyperglycemia: Secondary | ICD-10-CM | POA: Diagnosis present

## 2019-02-15 DIAGNOSIS — R1011 Right upper quadrant pain: Secondary | ICD-10-CM

## 2019-02-15 DIAGNOSIS — D649 Anemia, unspecified: Secondary | ICD-10-CM | POA: Diagnosis present

## 2019-02-15 DIAGNOSIS — F0391 Unspecified dementia with behavioral disturbance: Secondary | ICD-10-CM | POA: Diagnosis present

## 2019-02-15 DIAGNOSIS — R6520 Severe sepsis without septic shock: Secondary | ICD-10-CM | POA: Diagnosis present

## 2019-02-15 DIAGNOSIS — J9621 Acute and chronic respiratory failure with hypoxia: Secondary | ICD-10-CM | POA: Diagnosis present

## 2019-02-15 DIAGNOSIS — E871 Hypo-osmolality and hyponatremia: Secondary | ICD-10-CM | POA: Diagnosis present

## 2019-02-15 LAB — COMPREHENSIVE METABOLIC PANEL
ALT: 31 U/L (ref 0–44)
AST: 30 U/L (ref 15–41)
Albumin: 3 g/dL — ABNORMAL LOW (ref 3.5–5.0)
Alkaline Phosphatase: 124 U/L (ref 38–126)
Anion gap: 19 — ABNORMAL HIGH (ref 5–15)
BUN: 25 mg/dL — ABNORMAL HIGH (ref 8–23)
CO2: 21 mmol/L — ABNORMAL LOW (ref 22–32)
Calcium: 8.2 mg/dL — ABNORMAL LOW (ref 8.9–10.3)
Chloride: 92 mmol/L — ABNORMAL LOW (ref 98–111)
Creatinine, Ser: 1.34 mg/dL — ABNORMAL HIGH (ref 0.61–1.24)
GFR calc Af Amer: 57 mL/min — ABNORMAL LOW (ref >60)
GFR calc non Af Amer: 49 mL/min — ABNORMAL LOW (ref >60)
Glucose, Bld: 301 mg/dL — ABNORMAL HIGH (ref 70–99)
Potassium: 3.8 mmol/L (ref 3.5–5.1)
Sodium: 132 mmol/L — ABNORMAL LOW (ref 135–145)
Total Bilirubin: 1.9 mg/dL — ABNORMAL HIGH (ref 0.3–1.2)
Total Protein: 6.3 g/dL — ABNORMAL LOW (ref 6.5–8.1)

## 2019-02-15 LAB — URINALYSIS, ROUTINE W REFLEX MICROSCOPIC
Bilirubin Urine: NEGATIVE
Glucose, UA: >=500 mg/dL — AB
Hgb urine dipstick: NEGATIVE
Ketones, ur: NEGATIVE mg/dL
Nitrite: NEGATIVE
Protein, ur: NEGATIVE mg/dL
Specific Gravity, Urine: 1.011 (ref 1.005–1.030)
WBC, UA: >50 WBC/hpf — ABNORMAL HIGH (ref 0–5)
pH: 5 (ref 5.0–8.0)

## 2019-02-15 LAB — CBC WITH DIFFERENTIAL/PLATELET
Abs Immature Granulocytes: 0.07 10*3/uL (ref 0.00–0.07)
Basophils Absolute: 0 10*3/uL (ref 0.0–0.1)
Basophils Relative: 0 %
Eosinophils Absolute: 0 10*3/uL (ref 0.0–0.5)
Eosinophils Relative: 0 %
HCT: 34.4 % — ABNORMAL LOW (ref 39.0–52.0)
Hemoglobin: 11.4 g/dL — ABNORMAL LOW (ref 13.0–17.0)
Immature Granulocytes: 1 %
Lymphocytes Relative: 1 %
Lymphs Abs: 0.1 10*3/uL — ABNORMAL LOW (ref 0.7–4.0)
MCH: 29.7 pg (ref 26.0–34.0)
MCHC: 33.1 g/dL (ref 30.0–36.0)
MCV: 89.6 fL (ref 80.0–100.0)
Monocytes Absolute: 0 10*3/uL — ABNORMAL LOW (ref 0.1–1.0)
Monocytes Relative: 0 %
Neutro Abs: 9.6 10*3/uL — ABNORMAL HIGH (ref 1.7–7.7)
Neutrophils Relative %: 98 %
Platelets: 151 10*3/uL (ref 150–400)
RBC: 3.84 MIL/uL — ABNORMAL LOW (ref 4.22–5.81)
RDW: 13.9 % (ref 11.5–15.5)
Smear Review: NORMAL
WBC: 9.8 10*3/uL (ref 4.0–10.5)
nRBC: 0 % (ref 0.0–0.2)

## 2019-02-15 LAB — BLOOD GAS, VENOUS
Acid-base deficit: 2.3 mmol/L — ABNORMAL HIGH (ref 0.0–2.0)
Bicarbonate: 22.5 mmol/L (ref 20.0–28.0)
O2 Saturation: 35.9 %
Patient temperature: 37
pCO2, Ven: 38 mmHg — ABNORMAL LOW (ref 44.0–60.0)
pH, Ven: 7.38 (ref 7.250–7.430)
pO2, Ven: <31 mmHg — CL (ref 32.0–45.0)

## 2019-02-15 LAB — PROTIME-INR
INR: 1.1 (ref 0.8–1.2)
Prothrombin Time: 14 seconds (ref 11.4–15.2)

## 2019-02-15 LAB — BETA-HYDROXYBUTYRIC ACID: Beta-Hydroxybutyric Acid: 0.26 mmol/L (ref 0.05–0.27)

## 2019-02-15 LAB — LACTIC ACID, PLASMA
Lactic Acid, Venous: 6.8 mmol/L (ref 0.5–1.9)
Lactic Acid, Venous: 4.1 mmol/L (ref 0.5–1.9)

## 2019-02-15 LAB — PROCALCITONIN: Procalcitonin: 4.32 ng/mL

## 2019-02-15 LAB — APTT: aPTT: <24 seconds — ABNORMAL LOW (ref 24–36)

## 2019-02-15 LAB — SARS CORONAVIRUS 2 BY RT PCR (HOSPITAL ORDER, PERFORMED IN ~~LOC~~ HOSPITAL LAB): SARS Coronavirus 2: POSITIVE — AB

## 2019-02-15 MED ORDER — VANCOMYCIN HCL 10 G IV SOLR
1750.0000 mg | Freq: Once | INTRAVENOUS | Status: AC
  Administered 2019-02-15: 1750 mg via INTRAVENOUS
  Filled 2019-02-15: qty 1750

## 2019-02-15 MED ORDER — SODIUM CHLORIDE 0.9 % IV SOLN
2.0000 g | Freq: Once | INTRAVENOUS | Status: AC
  Administered 2019-02-15: 2 g via INTRAVENOUS
  Filled 2019-02-15: qty 2

## 2019-02-15 MED ORDER — SODIUM CHLORIDE 0.9 % IV BOLUS
1000.0000 mL | Freq: Once | INTRAVENOUS | Status: AC
  Administered 2019-02-15: 1000 mL via INTRAVENOUS

## 2019-02-15 MED ORDER — METRONIDAZOLE IN NACL 5-0.79 MG/ML-% IV SOLN
500.0000 mg | Freq: Once | INTRAVENOUS | Status: AC
  Administered 2019-02-15: 500 mg via INTRAVENOUS
  Filled 2019-02-15: qty 100

## 2019-02-15 MED ORDER — ACETAMINOPHEN 650 MG RE SUPP
650.0000 mg | Freq: Once | RECTAL | Status: AC
  Administered 2019-02-15: 650 mg via RECTAL
  Filled 2019-02-15: qty 1

## 2019-02-15 MED ORDER — VANCOMYCIN HCL IN DEXTROSE 1-5 GM/200ML-% IV SOLN: 1000.0000 mg | Freq: Once | INTRAVENOUS | Status: DC

## 2019-02-15 MED ORDER — LACTATED RINGERS IV BOLUS
1000.0000 mL | Freq: Once | INTRAVENOUS | Status: AC
  Administered 2019-02-15: 1000 mL via INTRAVENOUS

## 2019-02-15 MED ORDER — IOHEXOL 350 MG/ML SOLN
100.0000 mL | Freq: Once | INTRAVENOUS | Status: AC | PRN
  Administered 2019-02-15: 100 mL via INTRAVENOUS

## 2019-02-15 NOTE — Progress Notes (Signed)
PHARMACY -  BRIEF ANTIBIOTIC NOTE   Pharmacy has received consult(s) for Vancomycin and Cefepime from an ED provider.  The patient's profile has been reviewed for ht/wt/allergies/indication/available labs.    One time order(s) placed for Vancomycin 1750mg  IV x 1 and Cefepime 2g IV x 1.  Further antibiotics/pharmacy consults should be ordered by admitting physician if indicated.                       Thank you, Pearla Dubonnet 02/15/2019  8:41 PM

## 2019-02-15 NOTE — Progress Notes (Signed)
CODE SEPSIS - PHARMACY COMMUNICATION  **Broad Spectrum Antibiotics should be administered within 1 hour of Sepsis diagnosis**  Time Code Sepsis Called/Page Received: 2032  Antibiotics Ordered: Cefepime,Vancomycin, flagyl  Time of 1st antibiotic administration: 2130  Additional action taken by pharmacy: none  If necessary, Name of Provider/Nurse Contacted: n/a    Pearla Dubonnet ,PharmD Clinical Pharmacist  02/15/2019  9:38 PM

## 2019-02-15 NOTE — ED Triage Notes (Signed)
Patient presents to Emergency Department via [facility] from home with complaints of AMS, poor O2 sats, and unable to take meds.   History of COVID positive test 8-9 Oct, admission to [facility], then rehab and then [facility] home.   Per EMS CBG 401 and family reports unable to give meds because since [facility] pt has been unable to swallow properly.    Pt is baseline dementia and doesn't talk - per EMS family reports pt is extra altered.    Pt has pacemaker with 2 stents and "blockage that in operable" per EMS

## 2019-02-15 NOTE — ED Provider Notes (Signed)
[facility] Emergency Department Provider Note  ____________________________________________   First MD Initiated Contact with Patient 02/15/19 2011     (approximate)  I have reviewed the triage vital signs and the nursing notes.   HISTORY  Chief Complaint Altered Mental Status and Fever    HPI Jesus [name] is a 84 y.o. male with pacemaker, dementia who was admitted on 10/7 for hypoxic respiratory failure secondary to COVID-19.  Discharged on room air of oxygen, as well as diabetes who now presents with altered mental status.  Since patient's diagnosis of coronavirus he is progressively continued to decline.  He was briefly in a nursing facility and then went back home.  At baseline he is nonverbal and altered.  They noted today though that he seemed more confused than normal which is why they brought him to the ER.  Patient noted to be febrile on arrival.  Per family, woke up this morning and was able to wake up and eat. No oxygen needed.  This evening around 6pm they noted his oxygen was 83%, started to shake, HR went up. This is why they called EMS.   Unable to get full HPI due to patient's altered mental status.   Past Medical History:  Diagnosis Date  . Diabetes mellitus without complication (HCC)   . Hypertension     Patient Active Problem List   Diagnosis Date Noted  . Viral pneumonia 01/22/2019  . Pneumonia due to COVID-19 virus 01/22/2019  . Dementia without behavioral disturbance (HCC) 01/22/2019  . Encephalopathy due to COVID-19 virus 01/22/2019  . DM (diabetes mellitus) (HCC) 01/22/2019  . HTN (hypertension) 01/22/2019  . NSTEMI (non-ST elevated myocardial infarction) (HCC) 10/22/2017    Past Surgical History:  Procedure Laterality Date  . APPENDECTOMY    . LEFT HEART CATH AND CORONARY ANGIOGRAPHY N/A 10/26/2017   Procedure: LEFT HEART CATH AND CORONARY ANGIOGRAPHY;  Surgeon: Dalia HeadingFath, Kenneth A, MD;  Location: [facility] INVASIVE CV LAB;   Service: Cardiovascular;  Laterality: N/A;    Prior to Admission medications   Medication Sig Start Date End Date Taking? Authorizing Provider  amLODipine (NORVASC) 10 MG tablet Take 10 mg by mouth See admin instructions. Pt takes Sunday and Wednesday morning, a daily dose of this product drops his blood pressure too low as observed by caretaker, pts physician Dr.Vishwanath Hande is aware of regimen.    [provider]  aspirin 81 MG chewable tablet Chew 81 mg by mouth daily.    [provider]  Calcium Carbonate-Vitamin D (CALCIUM 600+D PO) Take 1 tablet by mouth daily.     [provider]  clopidogrel (PLAVIX) 75 MG tablet Take 75 mg by mouth daily.    [provider]  dexamethasone (DECADRON) 2 MG tablet Take 2 tablets once daily for 3 days, then 1 tablet once daily for 3 days, then STOP. 02/04/19   Osvaldo ShipperKrishnan, Gokul, MD  donepezil (ARICEPT) 10 MG tablet Take 20 mg by mouth at bedtime.     [provider]  famotidine (PEPCID) 20 MG tablet Take 1 tablet (20 mg total) by mouth daily for 10 days. 02/05/19 02/15/19  Osvaldo ShipperKrishnan, Gokul, MD  glipiZIDE (GLUCOTROL) 10 MG tablet Take 20 mg by mouth 2 (two) times daily before a meal.    [provider]  guaiFENesin-dextromethorphan (ROBITUSSIN DM) 100-10 MG/5ML syrup Take 10 mLs by mouth every 4 (four) hours as needed for cough. 02/04/19   Osvaldo ShipperKrishnan, Gokul, MD  Maltodextrin-Xanthan Gum (RESOURCE THICKENUP CLEAR) POWD Use  to thicken fluids 02/04/19   Osvaldo Shipper, MD  Multiple Vitamins-Minerals (PRESERVISION AREDS 2+MULTI VIT) CAPS Take 1 capsule by mouth at bedtime.    [provider]  omega-3 acid ethyl esters (LOVAZA) 1 g capsule Take 2 g by mouth daily.    [provider]  pioglitazone (ACTOS) 30 MG tablet Take 30 mg by mouth daily. 10/11/17   [provider]  tamsulosin (FLOMAX) 0.4 MG CAPS capsule Take 0.8 mg by mouth at bedtime.    [provider]    Allergies  Patient has no known allergies.  No family history on file.  Social History Social History   Tobacco Use  . Smoking status: Never Smoker  . Smokeless tobacco: Never Used  Substance Use Topics  . Alcohol use: No  . Drug use: No      Review of Systems Unable to get full review of system due to patient's altered mental status.  ________________________   PHYSICAL EXAM:  VITAL SIGNS: Blood pressure 117/61, pulse (!) 121, temperature (!) 101.1 F (38.4 C), temperature source Oral, resp. rate 20, height  (1.778 m), weight 72.6 kg, SpO2 98 %.  Constitutional: Altered but confused Eyes: Conjunctivae are normal. EOMI. Head: Atraumatic. Nose: No congestion/rhinnorhea. Mouth/Throat: Mucous membranes are moist.   Neck: No stridor. Trachea Midline. FROM Cardiovascular: Tachycardic, regular rhythm. Grossly normal heart sounds.  Good peripheral circulation. Respiratory: mild increased WOB.  No retractions. Lungs CTAB. On 4L  Gastrointestinal: Soft and nontender. No distention. No abdominal bruits.  Musculoskeletal: No lower extremity tenderness nor edema.  No joint effusions. Neurologic: Unable to follow commands.  No gross focal neurologic deficits are appreciated.  Skin:  Skin is warm, dry and intact. No rash noted. Psychiatric: Not able to fully assess due to altered mental status. GU: Deferred   ____________________________________________   LABS (all labs ordered are listed, but only abnormal results are displayed)  Labs Reviewed  SARS CORONAVIRUS 2 BY RT PCR (HOSPITAL ORDER, PERFORMED IN [facility]) - Abnormal; Notable for the following components:      Result Value   SARS Coronavirus 2 POSITIVE (*)    All other components within normal limits  CBC WITH DIFFERENTIAL/PLATELET - Abnormal; Notable for the following components:   RBC 3.84 (*)    Hemoglobin 11.4 (*)    HCT 34.4 (*)    All other components within normal limits  COMPREHENSIVE METABOLIC  PANEL - Abnormal; Notable for the following components:   Sodium 132 (*)    Chloride 92 (*)    CO2 21 (*)    Glucose, Bld 301 (*)    BUN 25 (*)    Creatinine, Ser 1.34 (*)    Calcium 8.2 (*)    Total Protein 6.3 (*)    Albumin 3.0 (*)    Total Bilirubin 1.9 (*)    GFR calc non Af Amer 49 (*)    GFR calc Af Amer 57 (*)    Anion gap 19 (*)    All other components within normal limits  URINALYSIS, ROUTINE W REFLEX MICROSCOPIC - Abnormal; Notable for the following components:   Color, Urine YELLOW (*)    APPearance CLOUDY (*)    Glucose, UA >=500 (*)    Leukocytes,Ua MODERATE (*)    WBC, UA >50 (*)    Bacteria, UA MANY (*)    All other components within normal limits  LACTIC ACID, PLASMA - Abnormal; Notable for the following components:   Lactic Acid, Venous 6.8 (*)  All other components within normal limits  BLOOD GAS, VENOUS - Abnormal; Notable for the following components:   pCO2, Ven 38 (*)    pO2, Ven <31.0 (*)    Acid-base deficit 2.3 (*)    All other components within normal limits  URINE CULTURE  CULTURE, BLOOD (ROUTINE X 2)  CULTURE, BLOOD (ROUTINE X 2)  BETA-HYDROXYBUTYRIC ACID  PROTIME-INR  PROCALCITONIN  PROCALCITONIN  C-REACTIVE PROTEIN  LACTIC ACID, PLASMA  APTT   ____________________________________________   ED ECG REPORT I, Concha Se, the attending physician, personally viewed and interpreted this ECG.  EKG shows sinus tachycardia rate of 119 with a wide-complex tachycardia, negative scar Bosa's, T wave inversions in aVL and V2.  Favor left bundle branch block over V. tach. ____________________________________________  RADIOLOGY Vela Prose, personally viewed and evaluated these images (plain radiographs) as part of my medical decision making, as well as reviewing the written report by the radiologist.  ED MD interpretation:  Pt with bilateral opacities.   Official radiology report(s): Dg Chest Portable 1 View  Result Date: 02/15/2019  CLINICAL DATA:  Altered mental status EXAM: PORTABLE CHEST 1 VIEW COMPARISON:  01/31/2019 FINDINGS: Again noted are patchy bilateral airspace opacities, not significantly changed from prior study. A dual chamber left-sided pacemaker is noted. There is no pneumothorax. No large pleural effusion. The heart size remains stable. There is no acute osseous abnormality. IMPRESSION: 1. No significant oval change. 2. Persistent multifocal airspace opacities consistent with the patient's history of viral pneumonia. Electronically Signed   By: Katherine Mantle M.D.   On: 02/15/2019 21:17    ____________________________________________   PROCEDURES  Procedure(s) performed (including Critical Care):  .Critical Care Performed by: Concha Se, MD Authorized by: Concha Se, MD   Critical care provider statement:    Critical care time (minutes):  45   Critical care was necessary to treat or prevent imminent or life-threatening deterioration of the following conditions:  Sepsis and respiratory failure   Critical care was time spent personally by me on the following activities:  Discussions with consultants, evaluation of patient's response to treatment, examination of patient, ordering and performing treatments and interventions, ordering and review of laboratory studies, ordering and review of radiographic studies, pulse oximetry, re-evaluation of patient's condition, obtaining history from patient or surrogate and review of old charts     ____________________________________________   INITIAL IMPRESSION / ASSESSMENT AND PLAN / ED COURSE  Jesus [name] was evaluated in Emergency Department on 02/15/2019 for the symptoms described in the history of present illness. He was evaluated in the context of the global COVID-19 pandemic, which necessitated consideration that the patient might be at risk for infection with the SARS-CoV-2 virus that causes COVID-19. Institutional protocols and algorithms that  pertain to the evaluation of patients at risk for COVID-19 are in a state of rapid change based on information released by regulatory bodies including the CDC and federal and state organizations. These policies and algorithms were followed during the patient's care in the ED.    Patient presents febrile and tachycardic with concerns of sepsis.  Given patient was positive for coronavirus sometime ago will do sepsis alert and start broad-spectrum antibiotics.  Will get repeat Covid testing, x-ray to evaluate for pneumonia, CT head given his confusion, urine to evaluate for UTI.  Difficult to assess but patient does not seem to have any abdominal tenderness.  Urine concerning for UTI. Will send culture.   Patient is coronavirus positive.  Lactate was  6.8. Will add on Ct PE and CT abd.   CT head negative for acute infarct.  Lactate is downtrending.  Patient handed off pending CT reads.  Patient will need to be transferred to [facility] for coronavirus on 4 L, UTI, any other abnormalities on CT scans  ____________________________________________   FINAL CLINICAL IMPRESSION(S) / ED DIAGNOSES   Final diagnoses:  Acute cystitis without hematuria  Acute respiratory failure with hypoxia ([place])  RUQ pain  Sepsis, due to unspecified organism, unspecified whether acute organ dysfunction present [facility])      MEDICATIONS GIVEN DURING THIS VISIT:  Medications  vancomycin (VANCOCIN) 1,750 mg in sodium chloride 0.9 % 500 mL IVPB (1,750 mg Intravenous New Bag/Given 02/15/19 2149)  lactated ringers bolus 1,000 mL (has no administration in time range)  acetaminophen (TYLENOL) suppository 650 mg (650 mg Rectal Given 02/15/19 2042)  ceFEPIme (MAXIPIME) 2 g in sodium chloride 0.9 % 100 mL IVPB (0 g Intravenous Stopped 02/15/19 2142)  metroNIDAZOLE (FLAGYL) IVPB 500 mg (500 mg Intravenous New Bag/Given 02/15/19 2144)  sodium chloride 0.9 % bolus 1,000 mL (1,000 mLs Intravenous New Bag/Given 02/15/19 2129)   iohexol (OMNIPAQUE) 350 MG/ML injection 100 mL (100 mLs Intravenous Contrast Given 02/15/19 2233)     ED Discharge Orders    None       Note:  This document was prepared using Dragon voice recognition software and may include unintentional dictation errors.   Vanessa , MD 02/15/19 [phone]

## 2019-02-16 ENCOUNTER — Inpatient Hospital Stay: Payer: Medicare Other

## 2019-02-16 ENCOUNTER — Inpatient Hospital Stay (HOSPITAL_COMMUNITY)
Admission: AD | Admit: 2019-02-16 | Payer: Medicare Other | Source: Other Acute Inpatient Hospital | Admitting: Internal Medicine

## 2019-02-16 ENCOUNTER — Inpatient Hospital Stay (HOSPITAL_COMMUNITY)
Admission: AD | Admit: 2019-02-16 | Discharge: 2019-02-20 | DRG: 871 | Disposition: A | Discharge status: Home Health | Payer: Medicare Other | Source: Other Acute Inpatient Hospital | Attending: Internal Medicine | Admitting: Internal Medicine

## 2019-02-16 DIAGNOSIS — J1289 Other viral pneumonia: Secondary | ICD-10-CM | POA: Diagnosis present

## 2019-02-16 DIAGNOSIS — A419 Sepsis, unspecified organism: Secondary | ICD-10-CM | POA: Diagnosis present

## 2019-02-16 DIAGNOSIS — R6520 Severe sepsis without septic shock: Secondary | ICD-10-CM | POA: Diagnosis present

## 2019-02-16 DIAGNOSIS — A4159 Other Gram-negative sepsis: Principal | ICD-10-CM | POA: Diagnosis present

## 2019-02-16 DIAGNOSIS — I10 Essential (primary) hypertension: Secondary | ICD-10-CM | POA: Diagnosis present

## 2019-02-16 DIAGNOSIS — I2510 Atherosclerotic heart disease of native coronary artery without angina pectoris: Secondary | ICD-10-CM | POA: Diagnosis present

## 2019-02-16 DIAGNOSIS — N179 Acute kidney failure, unspecified: Secondary | ICD-10-CM | POA: Diagnosis present

## 2019-02-16 DIAGNOSIS — E1165 Type 2 diabetes mellitus with hyperglycemia: Secondary | ICD-10-CM | POA: Diagnosis present

## 2019-02-16 DIAGNOSIS — D638 Anemia in other chronic diseases classified elsewhere: Secondary | ICD-10-CM | POA: Diagnosis present

## 2019-02-16 DIAGNOSIS — B961 Klebsiella pneumoniae [K. pneumoniae] as the cause of diseases classified elsewhere: Secondary | ICD-10-CM | POA: Diagnosis not present

## 2019-02-16 DIAGNOSIS — E119 Type 2 diabetes mellitus without complications: Secondary | ICD-10-CM | POA: Diagnosis present

## 2019-02-16 DIAGNOSIS — Z79899 Other long term (current) drug therapy: Secondary | ICD-10-CM | POA: Diagnosis not present

## 2019-02-16 DIAGNOSIS — U071 COVID-19: Secondary | ICD-10-CM | POA: Diagnosis present

## 2019-02-16 DIAGNOSIS — E872 Acidosis, unspecified: Secondary | ICD-10-CM | POA: Diagnosis present

## 2019-02-16 DIAGNOSIS — F0391 Unspecified dementia with behavioral disturbance: Secondary | ICD-10-CM | POA: Diagnosis present

## 2019-02-16 DIAGNOSIS — N390 Urinary tract infection, site not specified: Secondary | ICD-10-CM | POA: Diagnosis present

## 2019-02-16 DIAGNOSIS — R9431 Abnormal electrocardiogram [ECG] [EKG]: Secondary | ICD-10-CM | POA: Diagnosis present

## 2019-02-16 DIAGNOSIS — Z7984 Long term (current) use of oral hypoglycemic drugs: Secondary | ICD-10-CM | POA: Diagnosis not present

## 2019-02-16 DIAGNOSIS — R7881 Bacteremia: Secondary | ICD-10-CM

## 2019-02-16 DIAGNOSIS — F0390 Unspecified dementia without behavioral disturbance: Secondary | ICD-10-CM | POA: Diagnosis present

## 2019-02-16 DIAGNOSIS — K219 Gastro-esophageal reflux disease without esophagitis: Secondary | ICD-10-CM | POA: Diagnosis present

## 2019-02-16 DIAGNOSIS — I454 Nonspecific intraventricular block: Secondary | ICD-10-CM | POA: Diagnosis present

## 2019-02-16 DIAGNOSIS — T360X5A Adverse effect of penicillins, initial encounter: Secondary | ICD-10-CM | POA: Diagnosis present

## 2019-02-16 DIAGNOSIS — A414 Sepsis due to anaerobes: Secondary | ICD-10-CM | POA: Diagnosis not present

## 2019-02-16 DIAGNOSIS — G9349 Other encephalopathy: Secondary | ICD-10-CM | POA: Diagnosis not present

## 2019-02-16 DIAGNOSIS — G92 Toxic encephalopathy: Secondary | ICD-10-CM | POA: Diagnosis present

## 2019-02-16 DIAGNOSIS — E860 Dehydration: Secondary | ICD-10-CM | POA: Diagnosis present

## 2019-02-16 DIAGNOSIS — S40812A Abrasion of left upper arm, initial encounter: Secondary | ICD-10-CM | POA: Diagnosis present

## 2019-02-16 DIAGNOSIS — K810 Acute cholecystitis: Secondary | ICD-10-CM | POA: Diagnosis present

## 2019-02-16 DIAGNOSIS — N3000 Acute cystitis without hematuria: Secondary | ICD-10-CM | POA: Diagnosis present

## 2019-02-16 DIAGNOSIS — E1169 Type 2 diabetes mellitus with other specified complication: Secondary | ICD-10-CM | POA: Diagnosis not present

## 2019-02-16 DIAGNOSIS — Z7982 Long term (current) use of aspirin: Secondary | ICD-10-CM

## 2019-02-16 DIAGNOSIS — J129 Viral pneumonia, unspecified: Secondary | ICD-10-CM

## 2019-02-16 DIAGNOSIS — D649 Anemia, unspecified: Secondary | ICD-10-CM | POA: Diagnosis present

## 2019-02-16 DIAGNOSIS — Z7902 Long term (current) use of antithrombotics/antiplatelets: Secondary | ICD-10-CM | POA: Diagnosis not present

## 2019-02-16 DIAGNOSIS — J9601 Acute respiratory failure with hypoxia: Secondary | ICD-10-CM | POA: Diagnosis present

## 2019-02-16 DIAGNOSIS — E871 Hypo-osmolality and hyponatremia: Secondary | ICD-10-CM | POA: Diagnosis present

## 2019-02-16 DIAGNOSIS — E8809 Other disorders of plasma-protein metabolism, not elsewhere classified: Secondary | ICD-10-CM | POA: Diagnosis present

## 2019-02-16 DIAGNOSIS — J9621 Acute and chronic respiratory failure with hypoxia: Secondary | ICD-10-CM | POA: Diagnosis present

## 2019-02-16 LAB — COMPREHENSIVE METABOLIC PANEL
ALT: 24 U/L (ref 0–44)
AST: 29 U/L (ref 15–41)
Albumin: 2.1 g/dL — ABNORMAL LOW (ref 3.5–5.0)
Alkaline Phosphatase: 94 U/L (ref 38–126)
Anion gap: 10 (ref 5–15)
BUN: 21 mg/dL (ref 8–23)
CO2: 22 mmol/L (ref 22–32)
Calcium: 7 mg/dL — ABNORMAL LOW (ref 8.9–10.3)
Chloride: 99 mmol/L (ref 98–111)
Creatinine, Ser: 1.28 mg/dL — ABNORMAL HIGH (ref 0.61–1.24)
GFR calc Af Amer: >60 mL/min (ref >60)
GFR calc non Af Amer: 52 mL/min — ABNORMAL LOW (ref >60)
Glucose, Bld: 338 mg/dL — ABNORMAL HIGH (ref 70–99)
Potassium: 4.9 mmol/L (ref 3.5–5.1)
Sodium: 131 mmol/L — ABNORMAL LOW (ref 135–145)
Total Bilirubin: 1.3 mg/dL — ABNORMAL HIGH (ref 0.3–1.2)
Total Protein: 4.9 g/dL — ABNORMAL LOW (ref 6.5–8.1)

## 2019-02-16 LAB — BLOOD CULTURE ID PANEL (REFLEXED)

## 2019-02-16 LAB — PROCALCITONIN: Procalcitonin: 64.22 ng/mL

## 2019-02-16 LAB — GLUCOSE, CAPILLARY
Glucose-Capillary: 317 mg/dL — ABNORMAL HIGH (ref 70–99)
Glucose-Capillary: 252 mg/dL — ABNORMAL HIGH (ref 70–99)
Glucose-Capillary: 231 mg/dL — ABNORMAL HIGH (ref 70–99)
Glucose-Capillary: 243 mg/dL — ABNORMAL HIGH (ref 70–99)

## 2019-02-16 LAB — CBC
HCT: 27.7 % — ABNORMAL LOW (ref 39.0–52.0)
Hemoglobin: 9.5 g/dL — ABNORMAL LOW (ref 13.0–17.0)
MCH: 30.1 pg (ref 26.0–34.0)
MCHC: 34.3 g/dL (ref 30.0–36.0)
MCV: 87.7 fL (ref 80.0–100.0)
Platelets: 123 10*3/uL — ABNORMAL LOW (ref 150–400)
RBC: 3.16 MIL/uL — ABNORMAL LOW (ref 4.22–5.81)
RDW: 13.9 % (ref 11.5–15.5)
WBC: 20 10*3/uL — ABNORMAL HIGH (ref 4.0–10.5)
nRBC: 0 % (ref 0.0–0.2)

## 2019-02-16 LAB — LACTIC ACID, PLASMA: Lactic Acid, Venous: 2 mmol/L (ref 0.5–1.9)

## 2019-02-16 LAB — C-REACTIVE PROTEIN: CRP: 1.5 mg/dL — ABNORMAL HIGH (ref <1.0)

## 2019-02-16 MED ORDER — DIPHENHYDRAMINE HCL 50 MG/ML IJ SOLN
12.5000 mg | Freq: Once | INTRAMUSCULAR | Status: AC
  Administered 2019-02-16: 12.5 mg via INTRAVENOUS
  Filled 2019-02-16: qty 1

## 2019-02-16 MED ORDER — ENOXAPARIN SODIUM 40 MG/0.4ML ~~LOC~~ SOLN
40.0000 mg | SUBCUTANEOUS | Status: DC
  Filled 2019-02-16: qty 0.4

## 2019-02-16 MED ORDER — CLOPIDOGREL BISULFATE 75 MG PO TABS: 75.0000 mg | ORAL_TABLET | Freq: Every day | ORAL | Status: DC

## 2019-02-16 MED ORDER — VANCOMYCIN HCL 10 G IV SOLR: 1750.0000 mg | INTRAVENOUS | Status: DC

## 2019-02-16 MED ORDER — ASPIRIN 81 MG PO CHEW
81.0000 mg | CHEWABLE_TABLET | Freq: Every day | ORAL | Status: DC
  Filled 2019-02-16: qty 1

## 2019-02-16 MED ORDER — TECHNETIUM TC 99M MEBROFENIN IV KIT
2.0000 | PACK | Freq: Once | INTRAVENOUS | Status: AC | PRN
  Administered 2019-02-16: 2.6 via INTRAVENOUS

## 2019-02-16 MED ORDER — LACTATED RINGERS IV SOLN
INTRAVENOUS | Status: DC
  Administered 2019-02-16 – 2019-02-17 (×2): via INTRAVENOUS

## 2019-02-16 MED ORDER — ACETAMINOPHEN 325 MG PO TABS: 650.0000 mg | ORAL_TABLET | Freq: Four times a day (QID) | ORAL | Status: DC | PRN

## 2019-02-16 MED ORDER — INSULIN ASPART 100 UNIT/ML ~~LOC~~ SOLN
0.0000 [IU] | Freq: Three times a day (TID) | SUBCUTANEOUS | Status: DC
  Administered 2019-02-16: 11 [IU] via SUBCUTANEOUS
  Filled 2019-02-16: qty 1

## 2019-02-16 MED ORDER — SODIUM CHLORIDE 0.9 % IV SOLN
Freq: Once | INTRAVENOUS | Status: AC
  Administered 2019-02-16: 06:00:00 via INTRAVENOUS

## 2019-02-16 MED ORDER — HEPARIN SODIUM (PORCINE) 5000 UNIT/ML IJ SOLN: 5000.0000 [IU] | Freq: Three times a day (TID) | INTRAMUSCULAR | Status: DC

## 2019-02-16 MED ORDER — DEXAMETHASONE SODIUM PHOSPHATE 10 MG/ML IJ SOLN
6.0000 mg | INTRAMUSCULAR | Status: DC
  Administered 2019-02-16: 06:00:00 6 mg via INTRAVENOUS
  Filled 2019-02-16: qty 1

## 2019-02-16 MED ORDER — INSULIN ASPART 100 UNIT/ML ~~LOC~~ SOLN: 0.0000 [IU] | Freq: Every day | SUBCUTANEOUS | Status: DC

## 2019-02-16 MED ORDER — FAMOTIDINE 20 MG PO TABS
20.0000 mg | ORAL_TABLET | Freq: Every day | ORAL | Status: DC
  Filled 2019-02-16: qty 1

## 2019-02-16 MED ORDER — SODIUM CHLORIDE 0.9 % IV SOLN
200.0000 mg | Freq: Once | INTRAVENOUS | Status: AC
  Administered 2019-02-16: 200 mg via INTRAVENOUS
  Filled 2019-02-16: qty 40

## 2019-02-16 MED ORDER — PIPERACILLIN-TAZOBACTAM 3.375 G IVPB
3.3750 g | Freq: Three times a day (TID) | INTRAVENOUS | Status: DC
  Administered 2019-02-16: 3.375 g via INTRAVENOUS
  Filled 2019-02-16: qty 50

## 2019-02-16 MED ORDER — ENOXAPARIN SODIUM 40 MG/0.4ML ~~LOC~~ SOLN
40.0000 mg | SUBCUTANEOUS | Status: DC
  Administered 2019-02-16 – 2019-02-19 (×4): 40 mg via SUBCUTANEOUS
  Filled 2019-02-16 (×4): qty 0.4

## 2019-02-16 MED ORDER — TECHNETIUM TC 99M MEBROFENIN IV KIT
5.0000 | PACK | Freq: Once | INTRAVENOUS | Status: AC | PRN
  Administered 2019-02-16: 5.44 via INTRAVENOUS

## 2019-02-16 MED ORDER — CLOPIDOGREL BISULFATE 75 MG PO TABS
75.0000 mg | ORAL_TABLET | Freq: Every day | ORAL | Status: DC
  Administered 2019-02-16 – 2019-02-20 (×5): 75 mg via ORAL
  Filled 2019-02-16 (×5): qty 1

## 2019-02-16 MED ORDER — DONEPEZIL HCL 10 MG PO TABS
20.0000 mg | ORAL_TABLET | Freq: Every day | ORAL | Status: DC
  Administered 2019-02-16 – 2019-02-19 (×4): 20 mg via ORAL
  Filled 2019-02-16 (×4): qty 2

## 2019-02-16 MED ORDER — FAMOTIDINE 20 MG PO TABS
20.0000 mg | ORAL_TABLET | Freq: Every day | ORAL | Status: DC
  Administered 2019-02-16 – 2019-02-20 (×5): 20 mg via ORAL
  Filled 2019-02-16 (×5): qty 1

## 2019-02-16 MED ORDER — TAMSULOSIN HCL 0.4 MG PO CAPS
0.8000 mg | ORAL_CAPSULE | Freq: Every day | ORAL | Status: DC
  Administered 2019-02-16 – 2019-02-19 (×4): 0.8 mg via ORAL
  Filled 2019-02-16 (×4): qty 2

## 2019-02-16 MED ORDER — PROMETHAZINE HCL 25 MG PO TABS: 12.5000 mg | ORAL_TABLET | Freq: Four times a day (QID) | ORAL | Status: DC | PRN

## 2019-02-16 MED ORDER — INSULIN GLARGINE 100 UNIT/ML ~~LOC~~ SOLN
20.0000 [IU] | Freq: Every day | SUBCUTANEOUS | Status: DC
  Administered 2019-02-16: 20 [IU] via SUBCUTANEOUS
  Filled 2019-02-16: qty 0.2

## 2019-02-16 MED ORDER — ASPIRIN 81 MG PO CHEW
81.0000 mg | CHEWABLE_TABLET | Freq: Every day | ORAL | Status: DC
  Administered 2019-02-16 – 2019-02-20 (×5): 81 mg via ORAL
  Filled 2019-02-16 (×5): qty 1

## 2019-02-16 MED ORDER — INSULIN ASPART 100 UNIT/ML ~~LOC~~ SOLN
0.0000 [IU] | Freq: Four times a day (QID) | SUBCUTANEOUS | Status: DC
  Administered 2019-02-16: 5 [IU] via SUBCUTANEOUS

## 2019-02-16 MED ORDER — DEXAMETHASONE SODIUM PHOSPHATE 10 MG/ML IJ SOLN
INTRAMUSCULAR | Status: AC
  Administered 2019-02-16: 6 mg via INTRAVENOUS
  Filled 2019-02-16: qty 1

## 2019-02-16 MED ORDER — SODIUM CHLORIDE 0.9 % IV SOLN
2.0000 g | INTRAVENOUS | Status: DC
  Administered 2019-02-16 – 2019-02-18 (×3): 2 g via INTRAVENOUS
  Filled 2019-02-16 (×3): qty 20

## 2019-02-16 MED ORDER — VANCOMYCIN HCL IN DEXTROSE 1-5 GM/200ML-% IV SOLN: 1000.0000 mg | INTRAVENOUS | Status: DC

## 2019-02-16 MED ORDER — TAMSULOSIN HCL 0.4 MG PO CAPS: 0.8000 mg | ORAL_CAPSULE | Freq: Every day | ORAL | Status: DC

## 2019-02-16 MED ORDER — CLOPIDOGREL BISULFATE 75 MG PO TABS
75.0000 mg | ORAL_TABLET | Freq: Every day | ORAL | Status: DC
  Filled 2019-02-16: qty 1

## 2019-02-16 MED ORDER — SODIUM CHLORIDE 0.9 % IV SOLN
3.0000 g | Freq: Four times a day (QID) | INTRAVENOUS | Status: DC
  Administered 2019-02-16: 3 g via INTRAVENOUS
  Filled 2019-02-16: qty 8

## 2019-02-16 MED ORDER — SODIUM CHLORIDE 0.9 % IV SOLN
100.0000 mg | INTRAVENOUS | Status: DC
  Filled 2019-02-16: qty 20

## 2019-02-16 MED ORDER — MORPHINE SULFATE (PF) 4 MG/ML IV SOLN
2.9000 mg | Freq: Once | INTRAVENOUS | Status: AC
  Administered 2019-02-16: 2.9 mg via INTRAVENOUS
  Filled 2019-02-16: qty 1

## 2019-02-16 NOTE — ED Notes (Signed)
Pt transported to [facility] with pink sacral pad in place for small area of skin irritation on sacrum. Pt also has blistering to left forearm. RN made aware.

## 2019-02-16 NOTE — ED Notes (Signed)
After calling on-call NM tech RN was instructed to  Call MD with [facility] for approval due to pts COVID positive status.

## 2019-02-16 NOTE — ED Notes (Signed)
NM on-call notified that consent was given from MD to perform NM exam.

## 2019-02-16 NOTE — Progress Notes (Addendum)
Pt was recently treated for COVID here with remdesivir. He presented with sepsis at [facility]. BCID has shown to be klebsiella pneumoniae with no carbapenem resistance detected. He developed rash to zosyn. D/w Dr Candiss Norse and we will use high dose ceftriaxone instead since he has gotten that before and cefepime would offer no advantage over it.  Ceftriaxone 2g IV q24  Onnie Boer, PharmD, [place], AAHIVP, CPP Infectious Disease Pharmacist 02/16/2019 6:31 PM

## 2019-02-16 NOTE — ED Provider Notes (Signed)
Patient discussed with general surgeon Dr. Lysle Pearl here at [facility] as well as hospitalist for hospital admission for sepsis, urinary tract infection, acute cholecystitis.   Gregor Hams, MD 02/16/19 [phone]

## 2019-02-16 NOTE — Consult Note (Signed)
Pharmacy Antibiotic Note  Jesus [name] is a 84 y.o. male admitted on 02/15/2019 with pneumonia, a possible UTI, acute cholecystitis and K pneumoniae bacteremia.  Pharmacy has been consulted for Zosyn, vancomycin and remdesivir dosing. His SCr is elevated above his baseline. There are no recent positive urine or blood cultures to guide therapy.  Plan: 1) start Zosyn 3.375g IV q8h (4 hour infusion)  2) Vancomycin 1750 mg IV x 1, then  Vancomycin 1000 mg IV Q 24 hrs Goal AUC 400-550 Expected AUC: 450 SCr used: 1.28 (baseline ~1.1) T1/2: 16.3 h Css 29.9/11.2 mcg/mL SCr in am  3) remdesivir 200 mg IV x 1, then 100 mg daily x 4 days ALT: 24 U/L CXR: Persistent multifocal airspace opacities consistent with the patient's history of viral pneumonia SpO2: 96% on 4L O2  Height: 5\' 10"  (177.8 cm) Weight: 160 lb (72.6 kg) IBW/kg (Calculated) : 73  Temp (24hrs), Avg:99.8 F (37.7 C), Min:97.8 F (36.6 C), Max:101.7 F (38.7 C)  Recent Labs  Lab 02/15/19 2038 02/15/19 2217 02/16/19 0543 02/16/19 0906  WBC 9.8  --  20.0*  --   CREATININE 1.34*  --  1.28*  --   LATICACIDVEN 6.8* 4.1*  --  2.0*    Estimated Creatinine Clearance: 45.7 mL/min (A) (by C-G formula based on SCr of 1.28 mg/dL (H)).    No Known Allergies  Antimicrobials this admission: Cefepime x 1 10/31 Flagyl x 1 10/31 Unasyn 11/1 x 1 Vancomycin 10/31 >>  Zosyn 11/1 >>   Microbiology results: 10/31 BCx: 3/4 K pneumoniae 10/31 UCx: pending  10/31 SARS CoV-2: positive  11/01 MRSA PCR: pending  Thank you for allowing pharmacy to be a part of this patient's care.  Dallie Piles, PharmD 02/16/2019 9:59 AM

## 2019-02-16 NOTE — Progress Notes (Signed)
Pharmacy Antibiotic Note  Jesus [name] is a 84 y.o. male admitted on 02/15/2019 with pneumonia.  Pharmacy has been consulted for Vancomycin and Unasyn dosing.  Plan: Unasyn 3gm IV q6hrs  Vancomycin 1750 mg IV Q 36 hrs. Goal AUC 400-550. Expected AUC: 524.8 SCr used: 1.28  Height: 5\' 10"  (177.8 cm) Weight: 160 lb (72.6 kg) IBW/kg (Calculated) : 73  Temp (24hrs), Avg:100.5 F (38.1 C), Min:98.6 F (37 C), Max:101.7 F (38.7 C)  Recent Labs  Lab 02/15/19 2038 02/15/19 2217 02/16/19 0543  WBC 9.8  --  20.0*  CREATININE 1.34*  --  1.28*  LATICACIDVEN 6.8* 4.1*  --     Estimated Creatinine Clearance: 45.7 mL/min (A) (by C-G formula based on SCr of 1.28 mg/dL (H)).    No Known Allergies  Antimicrobials this admission: Cefepime x 1 10/31 Flagyl x 1 10/31 Unasyn 11/1 >> Vancomycin 10/31 >>  Dose adjustments this admission:   Microbiology results:  BCx:   UCx:    Sputum:    MRSA PCR:   Thank you for allowing pharmacy to be a part of this patient's care.  Hart Robinsons A 02/16/2019 6:36 AM

## 2019-02-16 NOTE — ED Notes (Signed)
Pts belongings bag with shirt and pants given to [facility] for transport.

## 2019-02-16 NOTE — Progress Notes (Addendum)
         Follow Up Note  HPI: 84 year old male with past medical history of diabetes mellitus, dementia on Aricept, hypertension presented to the emergency room on the evening of 10/31 with complaints of increased confusion.  Patient had been recently admitted and discharged on 10/20 to a nursing facility from [facility] for Covid pneumonia, discharged on supplemental oxygen at 2 to 3 L.  Reportedly, he continued to decline since discharge and then went home.  He had since been weaned off of oxygen.  EMS called and patient noted to have an oxygen saturation of 83% and temperature of 101.  In the emergency room, patient found to have lactic acid level of 6.8, procalcitonin level of 4.32 with a normal white count and a very large urinary tract infection.  In addition, ABG noted hypoxia and CT scan noted no evidence of pulmonary embolus, but did note persistent bilateral groundglass opacities consistent with viral pneumonia and a distended gallbladder with gallbladder wall thickening concerning for cholecystitis.  Patient put on supplemental oxygen and given IV fluids plus IV cefepime, Flagyl and vancomycin.  Right upper quadrant ultrasound noted distended gallbladder and gallbladder sludge with wall thickening.  HIDA scan recommended.  Case discussed with Dr. Roselie Awkward, [place] [facility] [place].  Advised patient should be transferred to [facility] given respiratory failure and Covid related issues, but possible need for surgical intervention.  Addendum at 10am: Blood cultures positive for 3/4 bottles of Klebsiella.  Discussed with pharmacy and will provide Remdisivir plus Zosyn and vancomycin to cover pneumonia (may be Covid), urinary tract infection, and gallbladder.  Addendum 3:30pm: HIDA unremarkable.  GB filling.  Continue abx for now.  Principal Problem: Sepsis secondary to UTI (urinary tract infection): Patient meets criteria for sepsis on admission given lactic acidosis, acute kidney  injury, tachycardia and tachypnea with urinary source.  Blood cultures pending.  Responding to IV fluids and antibiotics.  Follow-up white blood cell count did jump up to 20 as did pro calcitonin which increased to 64, however lactic acid level trending downward and currently at 2.  Placed in progressive care.  Continue IV fluids and antibiotics.     Active Problems:   Viral pneumonia, possibly residual Covid, with acute respiratory failure with hypoxia: Supplemental oxygen, continue antibiotics.  Have also started back steroids and ordered Remdisivir    Dementia with behavioral disturbance ([place]): Holding Aricept given QT prolongation    DM (diabetes mellitus) ([place]): Sliding scale only    HTN (hypertension): Holding antihypertensives.    Prolonged QT interval: Holding Aricept.  Repeat EKG in the morning.    Hyponatremia: Mild, secondary to dehydration from sepsis    Lactic acidosis: Secondary to sepsis    Acute cholecystitis: Gallbladder ultrasound findings as above.  Have ordered a HIDA scan which is pending.  Discussed with general surgery at [facility].  If HIDA scan is positive, patient will need percutaneous drain placement by interventional radiology.  If HIDA scan is negative, may not need any further intervention.  They will be called after HIDA scan results.   Disposition: Progressive care.  Transferring to [facility]

## 2019-02-16 NOTE — ED Notes (Signed)
Pt remains in nuc med at this time. Pt remains hooked up to vitals and VSS.

## 2019-02-16 NOTE — ED Notes (Signed)
Admitting provider at bedside.

## 2019-02-16 NOTE — ED Notes (Signed)
Patient transported to NM 

## 2019-02-16 NOTE — ED Notes (Signed)
Pt bedding and diaper changed

## 2019-02-16 NOTE — ED Notes (Signed)
11 beat run of Vtach att, sinus BBB resumed

## 2019-02-16 NOTE — H&P (Addendum)
TRH H&P   Patient Demographics:    Jesus [name], is a 84 y.o. male  MRN: [id number]   DOB - [birth year]  Admit Date - 02/16/2019  Outpatient Primary MD for the patient is Barbette Reichmann, MD     Patient coming from: [facility]  CC - Fever    HPI:    Jesus [name]  is a 84 y.o. male, recently treated COVID-19 infection first day of diagnosis was 01/22/2019, mild dementia, DM type II, hypertension, bundle branch block, anemia of chronic disease, CAD on dual antiplatelet therapy who was admitted to [facility] for COVID-19 pneumonia 2 weeks ago and discharged in good condition was brought back from SNF with fevers apparently more short of breath and more confused to [facility] ER.  In [place] he was diagnosed with Klebsiella sepsis due to UTI, cholecystitis was ruled out he was sent here for further treatment.  His respiratory status is stable.  Patient is currently mildly confused, he denies any headache chest or abdominal pain, he does not know where he is but able to answer appropriately basic questions and follow basic commands, denies any shortness of breath or any discomfort at this time.   Review of systems:     A full 10 point Review of Systems was done, except as stated above, all other Review of Systems were negative.   With Past History of the following :    Past Medical History:  Diagnosis Date   Diabetes mellitus without complication (HCC)    Hypertension       Past Surgical History:  Procedure Laterality Date   APPENDECTOMY     LEFT HEART CATH AND CORONARY ANGIOGRAPHY N/A 10/26/2017   Procedure: LEFT HEART CATH AND CORONARY ANGIOGRAPHY;  Surgeon: Dalia Heading, MD;  Location: [facility] INVASIVE CV LAB;   Service: Cardiovascular;  Laterality: N/A;      Social History:     Social History   Tobacco Use   Smoking status: Never Smoker   Smokeless tobacco: Never Used  Substance Use Topics   Alcohol use: No         Family History :   No family history of CAD at young age.   Home Medications:   Prior to Admission medications   Medication Sig Start Date End Date Taking? Authorizing Provider  amLODipine (NORVASC) 10 MG  tablet Take 10 mg by mouth daily.    Yes [provider]  aspirin 81 MG chewable tablet Chew 81 mg by mouth daily.   Yes [provider]  clopidogrel (PLAVIX) 75 MG tablet Take 75 mg by mouth daily.   Yes [provider]  donepezil (ARICEPT) 10 MG tablet Take 20 mg by mouth at bedtime.    Yes [provider]  glipiZIDE (GLUCOTROL) 10 MG tablet Take 20 mg by mouth 2 (two) times daily before a meal.   Yes [provider]  guaiFENesin-dextromethorphan (ROBITUSSIN DM) 100-10 MG/5ML syrup Take 10 mLs by mouth every 4 (four) hours as needed for cough. 02/04/19  Yes Osvaldo Shipper, MD  Maltodextrin-Xanthan Gum (RESOURCE THICKENUP CLEAR) POWD Use to thicken fluids 02/04/19  Yes Osvaldo Shipper, MD  Multiple Vitamins-Minerals (PRESERVISION AREDS 2+MULTI VIT) CAPS Take 1 capsule by mouth at bedtime.   Yes [provider]  omega-3 acid ethyl esters (LOVAZA) 1 g capsule Take 2 g by mouth daily.   Yes [provider]  pioglitazone (ACTOS) 30 MG tablet Take 30 mg by mouth daily. 10/11/17  Yes [provider]  tamsulosin (FLOMAX) 0.4 MG CAPS capsule Take 0.8 mg by mouth at bedtime.   Yes [provider]  dexamethasone (DECADRON) 2 MG tablet Take 2 tablets once daily for 3 days, then 1 tablet once daily for 3 days, then STOP. Patient not taking: Reported on 02/16/2019 02/04/19   Osvaldo Shipper, MD  famotidine (PEPCID) 20 MG tablet Take 1 tablet (20 mg total) by mouth daily for 10 days. 02/05/19 02/15/19   Osvaldo Shipper, MD     Allergies:    No Known Allergies   Physical Exam:   Vitals  There were no vitals taken for this visit.   Last vital signs from 5 PM.  Temperature 97.8, pulse 59, respiratory rate 14, blood pressure 119/60.  Pulse ox 100% on 3 L nasal cannula oxygen.   1. General elderly white male lying in hospital bed in no apparent discomfort,  2. Normal affect and insight, Not Suicidal or Homicidal, Awake but pleasantly confused.  3. No F.N deficits, ALL C.Nerves Intact, Strength 5/5 all 4 extremities, Sensation intact all 4 extremities, Plantars down going.  4. Ears and Eyes appear Normal, Conjunctivae clear, PERRLA. Moist Oral Mucosa.  5. Supple Neck, No JVD, No cervical lymphadenopathy appriciated, No Carotid Bruits.  6. Symmetrical Chest wall movement, Good air movement bilaterally, CTAB.  7. RRR, No Gallops, Rubs or Murmurs, No Parasternal Heave.  8. Positive Bowel Sounds, Abdomen Soft, No tenderness, No organomegaly appriciated,No rebound -guarding or rigidity.  9.  No Cyanosis, he does have multiple whelps on his left arm with excoriated skin which appears at least 18 to 5 days old if not more  10. Good muscle tone,  joints appear normal , no effusions, Normal ROM.  11. No Palpable Lymph Nodes in Neck or Axillae      Data Review:    CBC Recent Labs  Lab 02/15/19 2038 02/16/19 0543  WBC 9.8 20.0*  HGB 11.4* 9.5*  HCT 34.4* 27.7*  PLT 151 123*  MCV 89.6 87.7  MCH 29.7 30.1  MCHC 33.1 34.3  RDW 13.9 13.9  LYMPHSABS 0.1*  --   MONOABS 0.0*  --   EOSABS 0.0  --   BASOSABS 0.0  --    ------------------------------------------------------------------------------------------------------------------  Chemistries  Recent Labs  Lab 02/15/19 2038 02/16/19 0543  NA 132* 131*  K 3.8 4.9  CL  92* 99  CO2 21* 22  GLUCOSE 301* 338*  BUN 25* 21  CREATININE 1.34* 1.28*  CALCIUM 8.2* 7.0*  AST 30 29  ALT 31 24  ALKPHOS 124 94  BILITOT 1.9* 1.3*    ------------------------------------------------------------------------------------------------------------------ estimated creatinine clearance is 45.7 mL/min (A) (by C-G formula based on SCr of 1.28 mg/dL (H)). ------------------------------------------------------------------------------------------------------------------ No results for input(s): TSH, T4TOTAL, T3FREE, THYROIDAB in the last 72 hours.  Invalid input(s): FREET3  Coagulation profile Recent Labs  Lab 02/15/19 2038  INR 1.1   ------------------------------------------------------------------------------------------------------------------- No results for input(s): DDIMER in the last 72 hours. -------------------------------------------------------------------------------------------------------------------  Cardiac Enzymes No results for input(s): CKMB, TROPONINI, MYOGLOBIN in the last 168 hours.  Invalid input(s): CK ------------------------------------------------------------------------------------------------------------------    Component Value Date/Time   BNP 1,239.0 (H) 10/26/2017 0359     ---------------------------------------------------------------------------------------------------------------  Urinalysis    Component Value Date/Time   COLORURINE YELLOW (A) 02/15/2019 2038   APPEARANCEUR CLOUDY (A) 02/15/2019 2038   LABSPEC 1.011 02/15/2019 2038   PHURINE 5.0 02/15/2019 2038   GLUCOSEU >=500 (A) 02/15/2019 2038   [place] 02/15/2019 2038   [place] 02/15/2019 2038   [place] 02/15/2019 2038   PROTEINUR NEGATIVE 02/15/2019 2038   NITRITE NEGATIVE 02/15/2019 2038   LEUKOCYTESUR MODERATE (A) 02/15/2019 2038    ----------------------------------------------------------------------------------------------------------------   Imaging Results:    Ct Head Wo Contrast  Result Date: 02/15/2019 CLINICAL DATA:  84 year old male with altered mental status. EXAM: CT  HEAD WITHOUT CONTRAST TECHNIQUE: Contiguous axial images were obtained from the base of the skull through the vertex without intravenous contrast. COMPARISON:  Head CT dated 01/13/2019 FINDINGS: Brain: Moderate age-related atrophy and chronic microvascular ischemic changes. Left basal ganglia old lacunar infarct. There is no acute intracranial hemorrhage. No mass effect or midline shift. No extra-axial fluid collection. Vascular: No hyperdense vessel or unexpected calcification. Skull: Normal. Negative for fracture or focal lesion. Sinuses/Orbits: Mild mucoperiosteal thickening of paranasal sinuses. Partially visualized left maxillary sinus retention cyst or polyp. Partial right mastoid effusion. The left mastoid air cells are clear. No air-fluid level. Other: None IMPRESSION: 1. No acute intracranial hemorrhage. 2. Moderate age-related atrophy and chronic microvascular ischemic changes. Left basal ganglia old lacunar infarct. Electronically Signed   By: Anner Crete M.D.   On: 02/15/2019 23:13   Ct Angio Chest Pe W And/or Wo Contrast  Result Date: 02/15/2019 CLINICAL DATA:  Complex chest pain.  Abdominal pain. EXAM: CT ANGIOGRAPHY CHEST CT ABDOMEN AND PELVIS WITH CONTRAST TECHNIQUE: Multidetector CT imaging of the chest was performed using the standard protocol during bolus administration of intravenous contrast. Multiplanar CT image reconstructions and MIPs were obtained to evaluate the vascular anatomy. Multidetector CT imaging of the abdomen and pelvis was performed using the standard protocol during bolus administration of intravenous contrast. CONTRAST:  178mL OMNIPAQUE IOHEXOL 350 MG/ML SOLN COMPARISON:  CT dated January 28, 2019. FINDINGS: CTA CHEST FINDINGS Cardiovascular: Evaluation is limited by respiratory motion artifact. Given this limitation, no PE was identified. The heart size is enlarged. Aortic calcifications are noted. Coronary artery calcifications are noted. Mediastinum/Nodes: --No  mediastinal or hilar lymphadenopathy. --No axillary lymphadenopathy. --No supraclavicular lymphadenopathy. --Normal thyroid gland. --The esophagus is unremarkable Lungs/Pleura: Again identified are scattered ground-glass airspace opacities bilaterally with some interval evolution since the prior study. The overall distribution is relatively similar to prior study. There is a new 2.5 by 1.5 cm cystic area within the left upper lobe that demonstrates an air-fluid level. There is no pneumothorax. The trachea is unremarkable. There is  some atelectasis at the lung bases. There is no significant pleural effusion. Musculoskeletal: No chest wall abnormality. No acute or significant osseous findings. Review of the MIP images confirms the above findings. CT ABDOMEN and PELVIS FINDINGS Hepatobiliary: The liver is normal. The gallbladder is distended with apparent gallbladder wall thickening, however this is not well evaluated secondary to motion artifact.There is mild intrahepatic and extrahepatic biliary ductal dilatation. Pancreas: Normal contours without ductal dilatation. No peripancreatic fluid collection. Spleen: No splenic laceration or hematoma. Adrenals/Urinary Tract: --Adrenal glands: No adrenal hemorrhage. --Right kidney/ureter: No hydronephrosis or perinephric hematoma. --Left kidney/ureter: No hydronephrosis or perinephric hematoma. --Urinary bladder: There is urinary bladder wall thickening with mild adjacent fat stranding. Stomach/Bowel: --Stomach/Duodenum: No hiatal hernia or other gastric abnormality. Normal duodenal course and caliber. --Small bowel: No dilatation or inflammation. --Colon: No focal abnormality. --Appendix: Normal. Vascular/Lymphatic: Atherosclerotic calcification is present within the non-aneurysmal abdominal aorta, without hemodynamically significant stenosis. --No retroperitoneal lymphadenopathy. --No mesenteric lymphadenopathy. --No pelvic or inguinal lymphadenopathy. Reproductive: The  prostate gland is enlarged. Other: No ascites or free air. The abdominal wall is normal. Musculoskeletal. No acute displaced fractures. Review of the MIP images confirms the above findings. IMPRESSION: 1. Motion degraded studies. 2. Given the above limitation, no pulmonary embolus was detected. 3. Persistent bilateral ground-glass airspace opacities consistent with viral pneumonia. These have slightly improved from prior studies. There is a new 2.5 cm pneumatocele in the left upper lobe as detailed above. 4. Distended gallbladder with gallbladder wall thickening. Findings are concerning for cholecystitis in the appropriate clinical setting. Follow-up with ultrasound is recommended. 5. Mild bladder wall thickening. Correlation with urinalysis is recommended to help exclude an underlying cystitis. Electronically Signed   By: Katherine Mantlehristopher  Green M.D.   On: 02/15/2019 23:18   Nm Hepatobiliary Liver Func  Result Date: 02/16/2019 CLINICAL DATA:  Altered mental status. Sludge, gallbladder distention, gallbladder wall thickening seen on an ultrasound from today. Cholecystitis suspected. EXAM: NUCLEAR MEDICINE HEPATOBILIARY IMAGING TECHNIQUE: Sequential images of the abdomen were obtained out to 60 minutes following intravenous administration of radiopharmaceutical. RADIOPHARMACEUTICALS:  5.44 mCi Tc-6656m  Choletec IV COMPARISON:  CT scan February 15, 2019.  Ultrasound February 16, 2019. FINDINGS: There is prompt uptake and washout from the liver. The gallbladder begins to fill at the end of the first hour of imaging. Filling significantly increases after morphine administration. There is normal excretion into the bowel. No other abnormalities. IMPRESSION: The gallbladder fills on this study excluding cystic duct obstruction. Electronically Signed   By: Gerome Samavid  Williams III M.D   On: 02/16/2019 15:23   Ct Abdomen Pelvis W Contrast  Result Date: 02/15/2019 CLINICAL DATA:  Complex chest pain.  Abdominal pain. EXAM: CT  ANGIOGRAPHY CHEST CT ABDOMEN AND PELVIS WITH CONTRAST TECHNIQUE: Multidetector CT imaging of the chest was performed using the standard protocol during bolus administration of intravenous contrast. Multiplanar CT image reconstructions and MIPs were obtained to evaluate the vascular anatomy. Multidetector CT imaging of the abdomen and pelvis was performed using the standard protocol during bolus administration of intravenous contrast. CONTRAST:  100mL OMNIPAQUE IOHEXOL 350 MG/ML SOLN COMPARISON:  CT dated January 28, 2019. FINDINGS: CTA CHEST FINDINGS Cardiovascular: Evaluation is limited by respiratory motion artifact. Given this limitation, no PE was identified. The heart size is enlarged. Aortic calcifications are noted. Coronary artery calcifications are noted. Mediastinum/Nodes: --No mediastinal or hilar lymphadenopathy. --No axillary lymphadenopathy. --No supraclavicular lymphadenopathy. --Normal thyroid gland. --The esophagus is unremarkable Lungs/Pleura: Again identified are scattered ground-glass airspace opacities bilaterally with some  interval evolution since the prior study. The overall distribution is relatively similar to prior study. There is a new 2.5 by 1.5 cm cystic area within the left upper lobe that demonstrates an air-fluid level. There is no pneumothorax. The trachea is unremarkable. There is some atelectasis at the lung bases. There is no significant pleural effusion. Musculoskeletal: No chest wall abnormality. No acute or significant osseous findings. Review of the MIP images confirms the above findings. CT ABDOMEN and PELVIS FINDINGS Hepatobiliary: The liver is normal. The gallbladder is distended with apparent gallbladder wall thickening, however this is not well evaluated secondary to motion artifact.There is mild intrahepatic and extrahepatic biliary ductal dilatation. Pancreas: Normal contours without ductal dilatation. No peripancreatic fluid collection. Spleen: No splenic laceration or  hematoma. Adrenals/Urinary Tract: --Adrenal glands: No adrenal hemorrhage. --Right kidney/ureter: No hydronephrosis or perinephric hematoma. --Left kidney/ureter: No hydronephrosis or perinephric hematoma. --Urinary bladder: There is urinary bladder wall thickening with mild adjacent fat stranding. Stomach/Bowel: --Stomach/Duodenum: No hiatal hernia or other gastric abnormality. Normal duodenal course and caliber. --Small bowel: No dilatation or inflammation. --Colon: No focal abnormality. --Appendix: Normal. Vascular/Lymphatic: Atherosclerotic calcification is present within the non-aneurysmal abdominal aorta, without hemodynamically significant stenosis. --No retroperitoneal lymphadenopathy. --No mesenteric lymphadenopathy. --No pelvic or inguinal lymphadenopathy. Reproductive: The prostate gland is enlarged. Other: No ascites or free air. The abdominal wall is normal. Musculoskeletal. No acute displaced fractures. Review of the MIP images confirms the above findings. IMPRESSION: 1. Motion degraded studies. 2. Given the above limitation, no pulmonary embolus was detected. 3. Persistent bilateral ground-glass airspace opacities consistent with viral pneumonia. These have slightly improved from prior studies. There is a new 2.5 cm pneumatocele in the left upper lobe as detailed above. 4. Distended gallbladder with gallbladder wall thickening. Findings are concerning for cholecystitis in the appropriate clinical setting. Follow-up with ultrasound is recommended. 5. Mild bladder wall thickening. Correlation with urinalysis is recommended to help exclude an underlying cystitis. Electronically Signed   By: Katherine Mantle M.D.   On: 02/15/2019 23:18   Dg Chest Portable 1 View  Result Date: 02/15/2019 CLINICAL DATA:  Altered mental status EXAM: PORTABLE CHEST 1 VIEW COMPARISON:  01/31/2019 FINDINGS: Again noted are patchy bilateral airspace opacities, not significantly changed from prior study. A dual chamber  left-sided pacemaker is noted. There is no pneumothorax. No large pleural effusion. The heart size remains stable. There is no acute osseous abnormality. IMPRESSION: 1. No significant oval change. 2. Persistent multifocal airspace opacities consistent with the patient's history of viral pneumonia. Electronically Signed   By: Katherine Mantle M.D.   On: 02/15/2019 21:17   US Abdomen Limited Ruq  Result Date: 02/16/2019 CLINICAL DATA:  Abdominal pain with distended gallbladder seen on recent CT. EXAM: ULTRASOUND ABDOMEN LIMITED RIGHT UPPER QUADRANT COMPARISON:  02/15/2019 CT. FINDINGS: Gallbladder: There is gallbladder sludge. The gallbladder wall is thickened measuring approximately 4 mm in thickness. The sonographic Eulah Pont sign is reported as negative. The gallbladder is distended. Common bile duct: Diameter: 5 mm Liver: No focal lesion identified. Within normal limits in parenchymal echogenicity. Portal vein is patent on color Doppler imaging with normal direction of blood flow towards the liver. Other: None. IMPRESSION: Gallbladder sludge with gallbladder wall thickening. In the absence of a positive sonographic Murphy sign, these findings are equivocal for acute cholecystitis. If there is high clinical suspicion for acute cholecystitis, follow-up with HIDA scan is recommended. The gallbladder is distended. Electronically Signed   By: Katherine Mantle M.D.   On: 02/16/2019 00:31  My personal review of EKG: Rhythm NSR, right bundle branch block, no Acute ST changes   Assessment & Plan:     1.  Klebsiella sepsis and bacteremia causing toxic encephalopathy in a patient with underlying dementia, most likely source is urine.  Sepsis pathophysiology seems to have improved, continue IV fluids for hydration along with antibiotics.  There was suspicion that he had cholecystitis but this was ruled out after a negative HIDA scan.  He also has no tenderness on exam in the right upper quadrant or pain.  Will  continue supportive care and monitor closely.   2.  COVID-19 pneumonitis.  Clinically treated appropriately 2 weeks ago.  Likely off of steroid taper hold further steroids and monitor.  3.  Dementia with toxic encephalopathy.  At risk for delirium, minimize narcotics and benzodiazepines, supportive care if needed Haldol as needed.  To new home dose Namenda.  4.  Left arm skin allergy with multiple whelps and excoriated skin.  Wound care consult, looks like an allergic reaction, stop Zosyn, question if he had infiltrated IV previously, Benadryl single dose and switch antibiotic to cefepime for now.  5.  Mild AKI due to sepsis.  Hydrate and monitor.  Also monitor bladder scans to rule out any obstruction causing UTI and renal failure.  6.  CAD.  Continue dual antiplatelet therapy for now.  7.  GERD.  On Pepcid.  8.  Hypertension.  On Norvasc.  9.  DM type II.  Hold oral hypoglycemics, low-dose Lantus and sliding scale every 6 hours.  Lab Results  Component Value Date   HGBA1C 6.7 (H) 01/23/2019   CBG (last 3)  Recent Labs    02/16/19 0902 02/16/19 1214  GLUCAP 317* 252*      DVT Prophylaxis Heparin   AM Labs Ordered, also please review Full Orders  Family Communication: Admission, patients condition and plan of care including tests being ordered have been discussed with the patient who indicates understanding and agree with the plan and Code Status.  Called cell phone for son Burnadette Pop on 02/16/2019 at 6:25 PM.  Voicemail mailbox is full.   Code Status Full  Likely DC to  SNF  Condition GUARDED    Consults called: None    Admission status: Inpt    Time spent in minutes : 35   Susa Raring M.D on 02/16/2019 at 6:22 PM  To page go to [email or web address] - password [email or web address]

## 2019-02-16 NOTE — Progress Notes (Signed)
PHARMACY - PHYSICIAN COMMUNICATION CRITICAL VALUE ALERT - BLOOD CULTURE IDENTIFICATION (BCID)  Results for orders placed or performed during the hospital encounter of 02/15/19  Blood Culture ID Panel (Reflexed) (Collected: 02/15/2019  8:38 PM)  Result Value Ref Range   Enterococcus species NOT DETECTED NOT DETECTED   Listeria monocytogenes NOT DETECTED NOT DETECTED   Staphylococcus species NOT DETECTED NOT DETECTED   Staphylococcus aureus (BCID) NOT DETECTED NOT DETECTED   Streptococcus species NOT DETECTED NOT DETECTED   Streptococcus agalactiae NOT DETECTED NOT DETECTED   Streptococcus pneumoniae NOT DETECTED NOT DETECTED   Streptococcus pyogenes NOT DETECTED NOT DETECTED   Acinetobacter baumannii NOT DETECTED NOT DETECTED   Enterobacteriaceae species DETECTED (A) NOT DETECTED   Enterobacter cloacae complex NOT DETECTED NOT DETECTED   Escherichia coli NOT DETECTED NOT DETECTED   Klebsiella oxytoca NOT DETECTED NOT DETECTED   Klebsiella pneumoniae DETECTED (A) NOT DETECTED   Proteus species NOT DETECTED NOT DETECTED   Serratia marcescens NOT DETECTED NOT DETECTED   Carbapenem resistance NOT DETECTED NOT DETECTED   Haemophilus influenzae NOT DETECTED NOT DETECTED   Neisseria meningitidis NOT DETECTED NOT DETECTED   Pseudomonas aeruginosa NOT DETECTED NOT DETECTED   Candida albicans NOT DETECTED NOT DETECTED   Candida glabrata NOT DETECTED NOT DETECTED   Candida krusei NOT DETECTED NOT DETECTED   Candida parapsilosis NOT DETECTED NOT DETECTED   Candida tropicalis NOT DETECTED NOT DETECTED    Name of physician (or Provider) Contacted: Lyman Speller, MD  Changes to prescribed antibiotics required: the patient needs coverage for pneumonia, a possible UTI, acute cholecystitis and K pneumoniae bacteremia: 1) change Unasyn to Zosyn 2) continue vancomycin 3) continue remdesivir  Dallie Piles, PharmD 02/16/2019  10:22 AM

## 2019-02-16 NOTE — ED Notes (Signed)
Pt alert and answering direct questions

## 2019-02-16 NOTE — ED Notes (Signed)
Left arm IV found infiltrated att, pt denies pain, leaking and blistered

## 2019-02-16 NOTE — ED Notes (Signed)
Attempted to call pts family. No answer to the phone. Phone did not go to voicemail.

## 2019-02-16 NOTE — ED Notes (Signed)
Pt refusing breakfast. Pt agreed with RN when asked if he was waiting for lunch.

## 2019-02-16 NOTE — H&P (Signed)
History and Physical  Jesus [name] ZOX:[id number] DOB: March 11, 1935 DOA: 02/15/2019  Referring physician:  Artis [name] PCP: Jesus Reichmann, MD  Patient coming from: Home  Chief Complaint: Altered mental status and fever  HPI: Jesus [name] is a 84 y.o. male with medical history significant for type 2 diabetes mellitus, hypertension, dementia and normocytic anemia who presents to the emergency department due to altered mental status.  History cannot be obtained from patient due to possible underlying dementia and current condition.  History was obtained from ED physician and ED chart.  Per report, patient was recently admitted to and discharged from [name] from 10/7-10/20 due to acute hypoxic respiratory failure secondary to pneumonia due to COVID-19 with supplemental oxygen at 2-3 LPM via [name].  He was noted to have progressively continued to decline since discharge initially to a nursing facility and then went home. Patient was reported to have eaten breakfast this morning without any distress, he was also noted to have required no oxygen, however, around 6 PM yesterday, patient was noted to have an O2 sat of 83%, he was shaking and his heart rate increased, so EMS was activated.  ED Course:  In the emergency department, patient was noted to be febrile with a temperature of 101F, he was tachycardic and tachypneic, but O2 sat was 96% on supplemental oxygen at 4 LPM.  Work-up in the ED showed normocytic anemia, hyperglycemia, elevated BUN to creatinine 25/1.34.  Lactic acid 6.8> 4.1, elevated total bilirubin and hypoalbuminemia.  ABG showed hypoxia.  CT angiography of chest ruled out pulmonary embolus but showed persistent bilateral groundglass airspace opacities consistent with viral pneumonia and distended gallbladder with gallbladder wall thickening concerning for cholecystitis as well as mild bladder wall thickening suspicious for underlying cystitis.  CT of head without contrast showed no acute  intracranial hemorrhage and chest x-ray showed no significant overall change IV cefepime, IV Flagyl and IV vancomycin.  IV hydration was provided.  Hospitalist was asked to admit him for further evaluation and management.  Review of Systems: This cannot be obtained at this time due to patient's current status   Past Medical History:  Diagnosis Date   Diabetes mellitus without complication (HCC)    Hypertension    Past Surgical History:  Procedure Laterality Date   APPENDECTOMY     LEFT HEART CATH AND CORONARY ANGIOGRAPHY N/A 10/26/2017   Procedure: LEFT HEART CATH AND CORONARY ANGIOGRAPHY;  Surgeon: Jesus Heading, MD;  Location: [facility] INVASIVE CV LAB;  Service: Cardiovascular;  Laterality: N/A;    Social History:  reports that he has never smoked. He has never used smokeless tobacco. He reports that he does not drink alcohol or use drugs.   No Known Allergies  History reviewed. No pertinent family history.    Prior to Admission medications   Medication Sig Start Date End Date Taking? Authorizing Provider  amLODipine (NORVASC) 10 MG tablet Take 10 mg by mouth See admin instructions. Pt takes Sunday and Wednesday morning, a daily dose of this product drops his blood pressure too low as observed by caretaker, pts physician Dr.Vishwanath [name] is aware of regimen.    [provider]  aspirin 81 MG chewable tablet Chew 81 mg by mouth daily.    [provider]  Calcium Carbonate-Vitamin D (CALCIUM 600+D PO) Take 1 tablet by mouth daily.     [provider]  clopidogrel (PLAVIX) 75 MG tablet Take 75 mg by mouth daily.    [provider]  dexamethasone (DECADRON) 2  MG tablet Take 2 tablets once daily for 3 days, then 1 tablet once daily for 3 days, then STOP. 02/04/19   Jesus [name], Gokul, MD  donepezil (ARICEPT) 10 MG tablet Take 20 mg by mouth at bedtime.     [provider]  famotidine (PEPCID) 20 MG tablet Take 1 tablet (20 mg total) by  mouth daily for 10 days. 02/05/19 02/15/19  [facility] [name], Gokul, MD  glipiZIDE (GLUCOTROL) 10 MG tablet Take 20 mg by mouth 2 (two) times daily before a meal.    [provider]  guaiFENesin-dextromethorphan (ROBITUSSIN DM) 100-10 MG/5ML syrup Take 10 mLs by mouth every 4 (four) hours as needed for cough. 02/04/19   [facility] [name], Gokul, MD  Maltodextrin-Xanthan Gum (RESOURCE THICKENUP CLEAR) POWD Use to thicken fluids 02/04/19   [facility] [name], Gokul, MD  Multiple Vitamins-Minerals (PRESERVISION AREDS 2+MULTI VIT) CAPS Take 1 capsule by mouth at bedtime.    [provider]  omega-3 acid ethyl esters (LOVAZA) 1 g capsule Take 2 g by mouth daily.    [provider]  pioglitazone (ACTOS) 30 MG tablet Take 30 mg by mouth daily. 10/11/17   [provider]  tamsulosin (FLOMAX) 0.4 MG CAPS capsule Take 0.8 mg by mouth at bedtime.    [provider]    Physical Exam: BP (!) 107/38    Pulse 84    Temp 98.6 F (37 C) (Oral)    Resp 17    Ht 5\' 10"  (1.778 m)    Wt 72.6 kg    SpO2 100%    BMI 22.96 kg/m    General: 84 y.o. year-old male well developed well nourished in no acute distress.  Alert and oriented x1 (person only).  HEENT: Dry mucous membrane.  Normocephalic, atraumatic.  Pupils equal round and reactive to light  NECK: Supple, trachea medial  Cardiovascular: Tachycardic.  Regular rate and rhythm with no rubs or gallops.  No thyromegaly or JVD noted.  No lower extremity edema. 2/4 pulses in all 4 extremities.  Respiratory: Clear to auscultation with no wheezes or rales. Good inspiratory effort.  Abdomen: Soft nontender nondistended with normal bowel sounds x4 quadrants.  Muskuloskeletal: Pacemaker site noted in left anterior-lateral chest wall.  No cyanosis, clubbing or edema noted bilaterally  Neuro: CN II-XII intact, strength, sensation, reflexes  Skin: No ulcerative lesions noted or rashes  Psychiatry: Judgement and insight appear normal. Mood is  appropriate for condition and setting          Labs on Admission:  Basic Metabolic Panel: Recent Labs  Lab 02/15/19 2038  NA 132*  K 3.8  CL 92*  CO2 21*  GLUCOSE 301*  BUN 25*  CREATININE 1.34*  CALCIUM 8.2*   Liver Function Tests: Recent Labs  Lab 02/15/19 2038  AST 30  ALT 31  ALKPHOS 124  BILITOT 1.9*  PROT 6.3*  ALBUMIN 3.0*   No results for input(s): LIPASE, AMYLASE in the last 168 hours. No results for input(s): AMMONIA in the last 168 hours. CBC: Recent Labs  Lab 02/15/19 2038  WBC 9.8  NEUTROABS 9.6*  HGB 11.4*  HCT 34.4*  MCV 89.6  PLT 151   Cardiac Enzymes: No results for input(s): CKTOTAL, CKMB, CKMBINDEX, TROPONINI in the last 168 hours.  BNP (last 3 results) No results for input(s): BNP in the last 8760 hours.  ProBNP (last 3 results) No results for input(s): PROBNP in the last 8760 hours.  CBG: No results for input(s): GLUCAP in the last 168 hours.  Radiological Exams on Admission: Ct Head Wo Contrast  Result Date: 02/15/2019 CLINICAL DATA:  84 year old male with altered mental status. EXAM: CT HEAD WITHOUT CONTRAST TECHNIQUE: Contiguous axial images were obtained from the base of the skull through the vertex without intravenous contrast. COMPARISON:  Head CT dated 01/13/2019 FINDINGS: Brain: Moderate age-related atrophy and chronic microvascular ischemic changes. Left basal ganglia old lacunar infarct. There is no acute intracranial hemorrhage. No mass effect or midline shift. No extra-axial fluid collection. Vascular: No hyperdense vessel or unexpected calcification. Skull: Normal. Negative for fracture or focal lesion. Sinuses/Orbits: Mild mucoperiosteal thickening of paranasal sinuses. Partially visualized left maxillary sinus retention cyst or polyp. Partial right mastoid effusion. The left mastoid air cells are clear. No air-fluid level. Other: None IMPRESSION: 1. No acute intracranial hemorrhage. 2. Moderate age-related atrophy and  chronic microvascular ischemic changes. Left basal ganglia old lacunar infarct. Electronically Signed   By: Anner Crete M.D.   On: 02/15/2019 23:13   Ct Angio Chest Pe W And/or Wo Contrast  Result Date: 02/15/2019 CLINICAL DATA:  Complex chest pain.  Abdominal pain. EXAM: CT ANGIOGRAPHY CHEST CT ABDOMEN AND PELVIS WITH CONTRAST TECHNIQUE: Multidetector CT imaging of the chest was performed using the standard protocol during bolus administration of intravenous contrast. Multiplanar CT image reconstructions and MIPs were obtained to evaluate the vascular anatomy. Multidetector CT imaging of the abdomen and pelvis was performed using the standard protocol during bolus administration of intravenous contrast. CONTRAST:  185mL OMNIPAQUE IOHEXOL 350 MG/ML SOLN COMPARISON:  CT dated January 28, 2019. FINDINGS: CTA CHEST FINDINGS Cardiovascular: Evaluation is limited by respiratory motion artifact. Given this limitation, no PE was identified. The heart size is enlarged. Aortic calcifications are noted. Coronary artery calcifications are noted. Mediastinum/Nodes: --No mediastinal or hilar lymphadenopathy. --No axillary lymphadenopathy. --No supraclavicular lymphadenopathy. --Normal thyroid gland. --The esophagus is unremarkable Lungs/Pleura: Again identified are scattered ground-glass airspace opacities bilaterally with some interval evolution since the prior study. The overall distribution is relatively similar to prior study. There is a new 2.5 by 1.5 cm cystic area within the left upper lobe that demonstrates an air-fluid level. There is no pneumothorax. The trachea is unremarkable. There is some atelectasis at the lung bases. There is no significant pleural effusion. Musculoskeletal: No chest wall abnormality. No acute or significant osseous findings. Review of the MIP images confirms the above findings. CT ABDOMEN and PELVIS FINDINGS Hepatobiliary: The liver is normal. The gallbladder is distended with apparent  gallbladder wall thickening, however this is not well evaluated secondary to motion artifact.There is mild intrahepatic and extrahepatic biliary ductal dilatation. Pancreas: Normal contours without ductal dilatation. No peripancreatic fluid collection. Spleen: No splenic laceration or hematoma. Adrenals/Urinary Tract: --Adrenal glands: No adrenal hemorrhage. --Right kidney/ureter: No hydronephrosis or perinephric hematoma. --Left kidney/ureter: No hydronephrosis or perinephric hematoma. --Urinary bladder: There is urinary bladder wall thickening with mild adjacent fat stranding. Stomach/Bowel: --Stomach/Duodenum: No hiatal hernia or other gastric abnormality. Normal duodenal course and caliber. --Small bowel: No dilatation or inflammation. --Colon: No focal abnormality. --Appendix: Normal. Vascular/Lymphatic: Atherosclerotic calcification is present within the non-aneurysmal abdominal aorta, without hemodynamically significant stenosis. --No retroperitoneal lymphadenopathy. --No mesenteric lymphadenopathy. --No pelvic or inguinal lymphadenopathy. Reproductive: The prostate gland is enlarged. Other: No ascites or free air. The abdominal wall is normal. Musculoskeletal. No acute displaced fractures. Review of the MIP images confirms the above findings. IMPRESSION: 1. Motion degraded studies. 2. Given the above limitation, no pulmonary embolus was detected. 3. Persistent bilateral ground-glass airspace opacities consistent with viral pneumonia.  These have slightly improved from prior studies. There is a new 2.5 cm pneumatocele in the left upper lobe as detailed above. 4. Distended gallbladder with gallbladder wall thickening. Findings are concerning for cholecystitis in the appropriate clinical setting. Follow-up with ultrasound is recommended. 5. Mild bladder wall thickening. Correlation with urinalysis is recommended to help exclude an underlying cystitis. Electronically Signed   By: Katherine Mantle M.D.   On:  02/15/2019 23:18   Ct Abdomen Pelvis W Contrast  Result Date: 02/15/2019 CLINICAL DATA:  Complex chest pain.  Abdominal pain. EXAM: CT ANGIOGRAPHY CHEST CT ABDOMEN AND PELVIS WITH CONTRAST TECHNIQUE: Multidetector CT imaging of the chest was performed using the standard protocol during bolus administration of intravenous contrast. Multiplanar CT image reconstructions and MIPs were obtained to evaluate the vascular anatomy. Multidetector CT imaging of the abdomen and pelvis was performed using the standard protocol during bolus administration of intravenous contrast. CONTRAST:  OMNIPAQUE IOHEXOL 350 MG/ML SOLN COMPARISON:  CT dated January 28, 2019. FINDINGS: CTA CHEST FINDINGS Cardiovascular: Evaluation is limited by respiratory motion artifact. Given this limitation, no PE was identified. The heart size is enlarged. Aortic calcifications are noted. Coronary artery calcifications are noted. Mediastinum/Nodes: --No mediastinal or hilar lymphadenopathy. --No axillary lymphadenopathy. --No supraclavicular lymphadenopathy. --Normal thyroid gland. --The esophagus is unremarkable Lungs/Pleura: Again identified are scattered ground-glass airspace opacities bilaterally with some interval evolution since the prior study. The overall distribution is relatively similar to prior study. There is a new 2.5 by 1.5 cm cystic area within the left upper lobe that demonstrates an air-fluid level. There is no pneumothorax. The trachea is unremarkable. There is some atelectasis at the lung bases. There is no significant pleural effusion. Musculoskeletal: No chest wall abnormality. No acute or significant osseous findings. Review of the MIP images confirms the above findings. CT ABDOMEN and PELVIS FINDINGS Hepatobiliary: The liver is normal. The gallbladder is distended with apparent gallbladder wall thickening, however this is not well evaluated secondary to motion artifact.There is mild intrahepatic and extrahepatic biliary  ductal dilatation. Pancreas: Normal contours without ductal dilatation. No peripancreatic fluid collection. Spleen: No splenic laceration or hematoma. Adrenals/Urinary Tract: --Adrenal glands: No adrenal hemorrhage. --Right kidney/ureter: No hydronephrosis or perinephric hematoma. --Left kidney/ureter: No hydronephrosis or perinephric hematoma. --Urinary bladder: There is urinary bladder wall thickening with mild adjacent fat stranding. Stomach/Bowel: --Stomach/Duodenum: No hiatal hernia or other gastric abnormality. Normal duodenal course and caliber. --Small bowel: No dilatation or inflammation. --Colon: No focal abnormality. --Appendix: Normal. Vascular/Lymphatic: Atherosclerotic calcification is present within the non-aneurysmal abdominal aorta, without hemodynamically significant stenosis. --No retroperitoneal lymphadenopathy. --No mesenteric lymphadenopathy. --No pelvic or inguinal lymphadenopathy. Reproductive: The prostate gland is enlarged. Other: No ascites or free air. The abdominal wall is normal. Musculoskeletal. No acute displaced fractures. Review of the MIP images confirms the above findings. IMPRESSION: 1. Motion degraded studies. 2. Given the above limitation, no pulmonary embolus was detected. 3. Persistent bilateral ground-glass airspace opacities consistent with viral pneumonia. These have slightly improved from prior studies. There is a new 2.5 cm pneumatocele in the left upper lobe as detailed above. 4. Distended gallbladder with gallbladder wall thickening. Findings are concerning for cholecystitis in the appropriate clinical setting. Follow-up with ultrasound is recommended. 5. Mild bladder wall thickening. Correlation with urinalysis is recommended to help exclude an underlying cystitis. Electronically Signed   By: Katherine Mantle M.D.   On: 02/15/2019 23:18   Dg Chest Portable 1 View  Result Date: 02/15/2019 CLINICAL DATA:  Altered mental status EXAM: PORTABLE  CHEST 1 VIEW  COMPARISON:  01/31/2019 FINDINGS: Again noted are patchy bilateral airspace opacities, not significantly changed from prior study. A dual chamber left-sided pacemaker is noted. There is no pneumothorax. No large pleural effusion. The heart size remains stable. There is no acute osseous abnormality. IMPRESSION: 1. No significant oval change. 2. Persistent multifocal airspace opacities consistent with the patient's history of viral pneumonia. Electronically Signed   By: Katherine Mantle M.D.   On: 02/15/2019 21:17   US Abdomen Limited Ruq  Result Date: 02/16/2019 CLINICAL DATA:  Abdominal pain with distended gallbladder seen on recent CT. EXAM: ULTRASOUND ABDOMEN LIMITED RIGHT UPPER QUADRANT COMPARISON:  02/15/2019 CT. FINDINGS: Gallbladder: There is gallbladder sludge. The gallbladder wall is thickened measuring approximately 4 mm in thickness. The sonographic Eulah Pont sign is reported as negative. The gallbladder is distended. Common bile duct: Diameter: 5 mm Liver: No focal lesion identified. Within normal limits in parenchymal echogenicity. Portal vein is patent on color Doppler imaging with normal direction of blood flow towards the liver. Other: None. IMPRESSION: Gallbladder sludge with gallbladder wall thickening. In the absence of a positive sonographic Murphy sign, these findings are equivocal for acute cholecystitis. If there is high clinical suspicion for acute cholecystitis, follow-up with HIDA scan is recommended. The gallbladder is distended. Electronically Signed   By: Katherine Mantle M.D.   On: 02/16/2019 00:31    EKG: I independently viewed the EKG done and my findings are as followed: Sinus tachycardia at rate of 119bpm and prolonged QTc ( )  Assessment/Plan Present on Admission:  UTI (urinary tract infection)  Viral pneumonia  Dementia without behavioral disturbance (HCC)  HTN (hypertension)  Principal Problem:   UTI (urinary tract infection) Active Problems:   Viral  pneumonia   Dementia without behavioral disturbance (HCC)   DM (diabetes mellitus) (HCC)   HTN (hypertension)   Prolonged QT interval   Hyponatremia   Lactic acidosis   Acute cholecystitis   Severe sepsis possible secondary to multifactorial including urinary tract infection POA and acute hypoxic respiratory failure secondary to COVID-19 viral pneumonia  Patient was noted to be febrile, tachycardic and tachypneic (meet SIRS criteria) urinalysis was positive for UTI.  Procalcitonin was 4.32 CT angiography of chest showed persistent bilateral groundglass airspace opacities consistent with viral pneumonia out of slightly improved from prior studies but with a new 2.5 cm pneumatocele in left upper lobe. Chest x-ray showed persistent multifocal airspace opacities consistent with the patient's history of viral pneumonia Patient was started on  IV cefepime, IV Flagyl and IV vancomycin.  We shall continue with IV vancomycin and Unasyn with plan to de-escalate based on blood culture and sputum culture Continue IV Decadron PORT/PSI of 122 points  Indicating 8.2-9.3% mortality Continue Mucinex, incentive spirometry, flutter valve and chest PT Blood culture and sputum culture pending Continue supplemental oxygen to maintain O2 sat > 94%  Lactic acidosis possible secondary to above   Lactic acid 6.8> 4.1 Continue IV hydration and continue to monitor lactic acid  Acute kidney injury  elevated BUN to creatinine 25/1.34; baseline creatinine 1.0-1.1  Renally adjust medications, avoid nephrotoxic agents/dehydration/hypotension  Dehydration Continue IV hydration  Hyperglycemia secondary to type 2 diabetes mellitus Continue insulin sliding scale and hypoglycemia protocol Glipizide and Actos will be held at this time  Hypertension Continue home meds when med rec is updated  Pseudohyponatremia Na+ 132; Corrected Na level based on blood glucose level (301) is 135 Continue to monitor sodium level  with morning labs  Possible acute cholecystitis With  upper quadrant ultrasound showed gallbladder sludge with gallbladder wall thickening suspicious for acute cholecystitis. General surgeon (Dr. Tonna Boehringer) was already consulted per ED physician and will see patient in the morning. HIDA scan will be done in the morning  Prolonged QTc Avoid QT prolonging drugs Magnesium level will be checked Repeat EKG in the morning   DVT prophylaxis: Lovenox, SCDs  Code Status: Full  Family Communication: None at bedside  Disposition Plan: SNF/GVC pending recommendation from neurosurgeon and clinical improvement  Consults called: General surgery (Dr.Sakai) per ED physician  Admission status: Inpatient    Frankey Shown MD [facility]  If 7PM-7AM, please contact night-coverage [email or web address]  02/16/2019, 5:41 AM

## 2019-02-16 NOTE — ED Notes (Signed)
Breakfast at bedside. Pt resting at this time.

## 2019-02-17 ENCOUNTER — Encounter (HOSPITAL_COMMUNITY): Payer: Self-pay

## 2019-02-17 ENCOUNTER — Other Ambulatory Visit: Payer: Self-pay

## 2019-02-17 LAB — COMPREHENSIVE METABOLIC PANEL
ALT: 24 U/L (ref 0–44)
AST: 27 U/L (ref 15–41)
Albumin: 2.3 g/dL — ABNORMAL LOW (ref 3.5–5.0)
Alkaline Phosphatase: 93 U/L (ref 38–126)
Anion gap: 10 (ref 5–15)
BUN: 21 mg/dL (ref 8–23)
CO2: 23 mmol/L (ref 22–32)
Calcium: 7.6 mg/dL — ABNORMAL LOW (ref 8.9–10.3)
Chloride: 103 mmol/L (ref 98–111)
Creatinine, Ser: 1.17 mg/dL (ref 0.61–1.24)
GFR calc Af Amer: >60 mL/min (ref >60)
GFR calc non Af Amer: 58 mL/min — ABNORMAL LOW (ref >60)
Glucose, Bld: 57 mg/dL — ABNORMAL LOW (ref 70–99)
Potassium: 3.8 mmol/L (ref 3.5–5.1)
Sodium: 136 mmol/L (ref 135–145)
Total Bilirubin: 0.8 mg/dL (ref 0.3–1.2)
Total Protein: 5.4 g/dL — ABNORMAL LOW (ref 6.5–8.1)

## 2019-02-17 LAB — CBC WITH DIFFERENTIAL/PLATELET
Abs Immature Granulocytes: 0.18 10*3/uL — ABNORMAL HIGH (ref 0.00–0.07)
Basophils Absolute: 0 10*3/uL (ref 0.0–0.1)
Basophils Relative: 0 %
Eosinophils Absolute: 0 10*3/uL (ref 0.0–0.5)
Eosinophils Relative: 0 %
HCT: 29.6 % — ABNORMAL LOW (ref 39.0–52.0)
Hemoglobin: 9.8 g/dL — ABNORMAL LOW (ref 13.0–17.0)
Immature Granulocytes: 1 %
Lymphocytes Relative: 8 %
Lymphs Abs: 1.4 10*3/uL (ref 0.7–4.0)
MCH: 30.1 pg (ref 26.0–34.0)
MCHC: 33.1 g/dL (ref 30.0–36.0)
MCV: 90.8 fL (ref 80.0–100.0)
Monocytes Absolute: 0.5 10*3/uL (ref 0.1–1.0)
Monocytes Relative: 3 %
Neutro Abs: 14.1 10*3/uL — ABNORMAL HIGH (ref 1.7–7.7)
Neutrophils Relative %: 88 %
Platelets: 122 10*3/uL — ABNORMAL LOW (ref 150–400)
RBC: 3.26 MIL/uL — ABNORMAL LOW (ref 4.22–5.81)
RDW: 14.7 % (ref 11.5–15.5)
WBC: 16.1 10*3/uL — ABNORMAL HIGH (ref 4.0–10.5)
nRBC: 0 % (ref 0.0–0.2)

## 2019-02-17 LAB — GLUCOSE, CAPILLARY
Glucose-Capillary: 130 mg/dL — ABNORMAL HIGH (ref 70–99)
Glucose-Capillary: 150 mg/dL — ABNORMAL HIGH (ref 70–99)
Glucose-Capillary: 223 mg/dL — ABNORMAL HIGH (ref 70–99)
Glucose-Capillary: 233 mg/dL — ABNORMAL HIGH (ref 70–99)
Glucose-Capillary: 262 mg/dL — ABNORMAL HIGH (ref 70–99)
Glucose-Capillary: 46 mg/dL — ABNORMAL LOW (ref 70–99)
Glucose-Capillary: 49 mg/dL — ABNORMAL LOW (ref 70–99)

## 2019-02-17 LAB — MAGNESIUM: Magnesium: 2 mg/dL (ref 1.7–2.4)

## 2019-02-17 LAB — PROCALCITONIN: Procalcitonin: 43.62 ng/mL

## 2019-02-17 LAB — ABO/RH: ABO/RH(D): O POS

## 2019-02-17 LAB — HEMOGLOBIN A1C
Hgb A1c MFr Bld: 7.9 % — ABNORMAL HIGH (ref 4.8–5.6)
Mean Plasma Glucose: 180.03 mg/dL

## 2019-02-17 LAB — LACTIC ACID, PLASMA: Lactic Acid, Venous: 2.5 mmol/L (ref 0.5–1.9)

## 2019-02-17 LAB — C-REACTIVE PROTEIN: CRP: 23.8 mg/dL — ABNORMAL HIGH (ref <1.0)

## 2019-02-17 LAB — D-DIMER, QUANTITATIVE: D-Dimer, Quant: 6.71 ug/mL-FEU — ABNORMAL HIGH (ref 0.00–0.50)

## 2019-02-17 MED ORDER — INSULIN ASPART 100 UNIT/ML ~~LOC~~ SOLN
0.0000 [IU] | Freq: Three times a day (TID) | SUBCUTANEOUS | Status: DC
  Administered 2019-02-17 (×2): 3 [IU] via SUBCUTANEOUS
  Administered 2019-02-18: 5 [IU] via SUBCUTANEOUS
  Administered 2019-02-18 – 2019-02-19 (×2): 1 [IU] via SUBCUTANEOUS
  Administered 2019-02-19: 5 [IU] via SUBCUTANEOUS
  Administered 2019-02-20: 7 [IU] via SUBCUTANEOUS

## 2019-02-17 MED ORDER — DEXTROSE 50 % IV SOLN
INTRAVENOUS | Status: AC
  Administered 2019-02-17: 50 mL
  Filled 2019-02-17: qty 50

## 2019-02-17 MED ORDER — DEXAMETHASONE SODIUM PHOSPHATE 4 MG/ML IJ SOLN
4.0000 mg | INTRAMUSCULAR | Status: DC
  Administered 2019-02-17 – 2019-02-19 (×3): 4 mg via INTRAVENOUS
  Filled 2019-02-17 (×3): qty 1

## 2019-02-17 MED ORDER — INSULIN ASPART 100 UNIT/ML ~~LOC~~ SOLN
0.0000 [IU] | Freq: Every day | SUBCUTANEOUS | Status: DC
  Administered 2019-02-17: 21:00:00 2 [IU] via SUBCUTANEOUS
  Administered 2019-02-18: 3 [IU] via SUBCUTANEOUS

## 2019-02-17 MED ORDER — RESOURCE THICKENUP CLEAR PO POWD
ORAL | Status: DC | PRN
  Filled 2019-02-17: qty 125

## 2019-02-17 MED ORDER — LACTATED RINGERS IV SOLN: INTRAVENOUS | Status: AC

## 2019-02-17 MED ORDER — GLUCOSE 40 % PO GEL
1.0000 | Freq: Once | ORAL | Status: AC | PRN
  Administered 2019-02-19: 37.5 g via ORAL
  Filled 2019-02-17 (×2): qty 1

## 2019-02-17 MED ORDER — DEXAMETHASONE SODIUM PHOSPHATE 10 MG/ML IJ SOLN: 6.0000 mg | INTRAMUSCULAR | Status: DC

## 2019-02-17 MED ORDER — INSULIN GLARGINE 100 UNIT/ML ~~LOC~~ SOLN
10.0000 [IU] | Freq: Every day | SUBCUTANEOUS | Status: DC
  Administered 2019-02-17 – 2019-02-18 (×2): 10 [IU] via SUBCUTANEOUS
  Filled 2019-02-17 (×3): qty 0.1

## 2019-02-17 NOTE — TOC Initial Note (Signed)
Transition of Care [facility]) - Initial/Assessment Note    Patient Details  Name: Jesus [name] MRN: [id number] Date of Birth: 1934-08-09  [facility] CM/SW Contact:    Carles Collet, RN Phone Number: 02/17/2019, 1:59 PM  Clinical Narrative:                 Per notes patient recently discharged from [facility] to SNF then home. Office notes from n10/30 show he is active with [facility], and has support from brother.   ANticpate patient will need continued HH or higher level of assistance at DC.  Patient is identified as a high risk for readmission and will be followed by the [facility] team for DC planning. CM and CSW can be reached through secure chat via care teams or through [place].     Expected Discharge Plan: [place]     Patient Goals and CMS Choice        Expected Discharge Plan and Services Expected Discharge Plan: [place]                                              Prior Living Arrangements/Services                       Activities of Daily Living Home Assistive Devices/Equipment: None ADL Screening (condition at time of admission) Patient's cognitive ability adequate to safely complete daily activities?: No Is the patient deaf or have difficulty hearing?: No Does the patient have difficulty seeing, even when wearing glasses/contacts?: No Does the patient have difficulty concentrating, remembering, or making decisions?: Yes Patient able to express need for assistance with ADLs?: No Does the patient have difficulty dressing or bathing?: Yes Independently performs ADLs?: No Communication: Needs assistance Does the patient have difficulty walking or climbing stairs?: Yes Weakness of Legs: Both Weakness of Arms/Hands: Both  Permission Sought/Granted                  Emotional Assessment              Admission diagnosis:  COVID-19 Patient Active Problem List   Diagnosis Date Noted  . UTI  (urinary tract infection) 02/16/2019  . Prolonged QT interval 02/16/2019  . Hyponatremia 02/16/2019  . Lactic acidosis 02/16/2019  . Acute cholecystitis 02/16/2019  . Acute on chronic respiratory failure with hypoxia ([place]) 02/16/2019  . Sepsis due to Klebsiella ([place]) 02/16/2019  . Klebsiella sepsis ([place]) 02/16/2019  . Viral pneumonia 01/22/2019  . Pneumonia due to COVID-19 virus 01/22/2019  . Dementia without behavioral disturbance ([place]) 01/22/2019  . Encephalopathy due to COVID-19 virus 01/22/2019  . DM (diabetes mellitus) ([place]) 01/22/2019  . HTN (hypertension) 01/22/2019  . NSTEMI (non-ST elevated myocardial infarction) ([place]) 10/22/2017   PCP:  Tracie Harrier, MD Pharmacy:   [facility] [address] ([address]), [place] [address] [place]) [place] [address] Phone: [phone] Fax: [phone]     Social Determinants of Health (SDOH) Interventions    Readmission Risk Interventions No flowsheet data found.

## 2019-02-17 NOTE — Evaluation (Signed)
\Clinical/Bedside Swallow Evaluation Patient Details  Name: Jesus [name] MRN: [id number] Date of Birth: [birth year]  Today's Date: 02/17/2019 Time: SLP Start Time (ACUTE ONLY): 22 SLP Stop Time (ACUTE ONLY): 1023 SLP Time Calculation (min) (ACUTE ONLY): 16 min  Past Medical History:  Past Medical History:  Diagnosis Date  . Diabetes mellitus without complication ([place])   . Hypertension    Past Surgical History:  Past Surgical History:  Procedure Laterality Date  . APPENDECTOMY    . LEFT HEART CATH AND CORONARY ANGIOGRAPHY N/A 10/26/2017   Procedure: LEFT HEART CATH AND CORONARY ANGIOGRAPHY;  Surgeon: Teodoro Spray, MD;  Location: [facility];  Service: Cardiovascular;  Laterality: N/A;   HPI:  Pt is an 84 yo male who was recently treated for COVID-19 (first day of dx 10/7), but returns to the hospital for increased SOB, AMS, and fevers. Pt found to have UTI. During recent admission pt was evaluated by SLP given reports of desaturations after PO intake. MBS on 10/19 revealed trace aspiration with thin liquids that elicited a cough. There was also concern for esophageal backflow. Dys 2 diet and nectar thick liquids recommended as well as water protocol in between meals. PMH includes: dementia, DM, HTN, BBB, anemia, CAD, GERD   Assessment / Plan / Recommendation Clinical Impression  Pt was recently evaluated with MBS on 10/19, revealing sensed aspiration of thin liquids and concern for esophageal dysphagia. Pt initially appeared to perform better with thin liquids, not eliciting any overt s/s of aspiration until returning to drink more thin liquids after he had eaten solids, at which time he exhibited consistent and immediate throat clear. A single delayed throat clear was noted with nectar thick liquids but no other signs suggestive of decreased airway protection were noted with other consistencies. Although this is not necessarily an overt indicator of aspiration, given how recently  that MBS was completed, this raises the suspicion for dysphagia with risk for aspiration. Will adjust liquids to nectar thick, allowing small sips of water in between meals after oral care. Will f/u for tolerance and potential to advance.  SLP Visit Diagnosis: Dysphagia, oropharyngeal phase (R13.12)    Aspiration Risk  Mild aspiration risk    Diet Recommendation Dysphagia 3 (Mech soft);Nectar-thick liquid;Other (Comment)(sips of water in between meals after oral care)   Liquid Administration via: Cup;Straw Medication Administration: Whole meds with puree Supervision: Patient able to self feed Compensations: Slow rate;Small sips/bites Postural Changes: Seated upright at 90 degrees;Remain upright for at least 30 minutes after po intake    Other  Recommendations Oral Care Recommendations: Oral care BID   Follow up Recommendations Skilled Nursing facility      Frequency and Duration min 2x/week  2 weeks       Prognosis Prognosis for Safe Diet Advancement: Good Barriers to Reach Goals: Cognitive deficits      Swallow Study   General HPI: Pt is an 84 yo male who was recently treated for COVID-19 (first day of dx 10/7), but returns to the hospital for increased SOB, AMS, and fevers. Pt found to have UTI. During recent admission pt was evaluated by SLP given reports of desaturations after PO intake. MBS on 10/19 revealed trace aspiration with thin liquids that elicited a cough. There was also concern for esophageal backflow. Dys 2 diet and nectar thick liquids recommended as well as water protocol in between meals. PMH includes: dementia, DM, HTN, BBB, anemia, CAD, GERD Type of Study: Bedside Swallow Evaluation Previous Swallow Assessment: see  HPI Diet Prior to this Study: Dysphagia 3 (soft);Thin liquids Temperature Spikes Noted: No Respiratory Status: Nasal cannula History of Recent Intubation: No Behavior/Cognition: Alert;Cooperative;Confused Oral Cavity Assessment: Within Functional  Limits Oral Care Completed by SLP: No Oral Cavity - Dentition: Adequate natural dentition Vision: Functional for self-feeding Self-Feeding Abilities: Able to feed self Patient Positioning: Upright in bed Baseline Vocal Quality: Normal Volitional Swallow: Able to elicit    Oral/Motor/Sensory Function Overall Oral Motor/Sensory Function: Within functional limits   Ice Chips Ice chips: Not tested   Thin Liquid Thin Liquid: Impaired Presentation: Cup;Self Fed;Straw Pharyngeal  Phase Impairments: Throat Clearing - Immediate    Nectar Thick Nectar Thick Liquid: Impaired Presentation: Self Fed;Straw Pharyngeal Phase Impairments: Throat Clearing - Delayed   Honey Thick Honey Thick Liquid: Not tested   Puree Puree: Within functional limits Presentation: Self Fed;Spoon   Solid     Solid: Within functional limits Presentation: Self Jesus [name] P Jesus [name] 02/17/2019,10:28 AM  Ivar Drape, M.A. CCC-SLP Acute Herbalist [phone] Office [phone]

## 2019-02-17 NOTE — Plan of Care (Signed)
New admit on 11/1

## 2019-02-17 NOTE — Plan of Care (Signed)
Called and spoke with Brother for update and discuss patient status.  Would like to consider having brother discharged to his HIS house.   Doesn't really want him going to a different facility (since still covid)??  No further questions at this time.

## 2019-02-17 NOTE — Plan of Care (Signed)
At shift change, found patient w CBG in the 40s. Gave OJ and glucogel, but still in the 49s. Patient asymptomatic.  Now giving D50. Dr. Informed.  Will continue to monitor.

## 2019-02-17 NOTE — Progress Notes (Signed)
PROGRESS NOTE                                                                                                                                                                                                             Patient Demographics:    Jesus [name], is a 84 y.o. male, DOB - 03/04/1935, ZOX:[id number]  Outpatient Primary MD for the patient is Barbette Reichmann, MD    LOS - 1  Admit date - 02/16/2019    CC - SEPSIS     Brief Narrative -    Jesus [name]  is a 84 y.o. male, recently treated COVID-19 infection first day of diagnosis was 01/22/2019, mild dementia, DM type II, hypertension, bundle branch block, anemia of chronic disease, CAD on dual antiplatelet therapy who was admitted to [facility] for COVID-19 pneumonia 2 weeks ago and discharged in good condition was brought back from SNF with fevers apparently more short of breath and more confused to [facility] ER.  In [place] he was diagnosed with Klebsiella sepsis due to UTI, cholecystitis was ruled out he was sent here for further treatment.  His respiratory status is stable.  Patient is currently mildly confused, he denies any headache chest or abdominal pain, he does not know where he is but able to answer appropriately basic questions and follow basic commands, denies any shortness of breath or any discomfort at this time.   Subjective:    Jesus [name] today has, No headache, No chest pain, No abdominal pain - No Nausea, No new weakness tingling or numbness, no Cough - SOB.     Assessment  & Plan :     1.  Klebsiella sepsis and bacteremia causing toxic encephalopathy in a patient with underlying dementia, most likely source is urine.  Sepsis pathophysiology seems to have improved, continue IV fluids for hydration along with IV antibiotics.  There was suspicion that he had cholecystitis but this was ruled out after a negative HIDA scan.  He also has no tenderness on exam in the  right upper quadrant or pain.  Will continue supportive care and monitor closely.   2.  COVID-19 pneumonitis.  Clinically treated appropriately 2 weeks ago.  Low dose steroids & monitor.  COVID-19 Labs  Recent Labs    02/15/19 2038 02/17/19 0558 02/17/19 8119  DDIMER  --   --  6.71*  CRP 1.5* 23.8*  --     Lab Results  Component Value Date   SARSCOV2NAA POSITIVE (A) 02/15/2019     3.  Dementia with toxic encephalopathy.  At risk for delirium, minimize narcotics and benzodiazepines, supportive care if needed Haldol as needed.  To new home dose Namenda.  4.  Left arm skin allergy with multiple whelps and excoriated skin.  Wound care consult, looks like an allergic reaction, stop Zosyn, question if he had infiltrated IV previously, Benadryl single dose and switch antibiotic to cefepime for now.  5.  Mild AKI due to sepsis.  Hydrate and monitor.  Also monitor bladder scans to rule out any obstruction causing UTI and renal failure.  6.  CAD.  Continue dual antiplatelet therapy for now.  7.  GERD.  On Pepcid.  8.  Hypertension.  On Norvasc.  9.  DM type II.  Hold oral hypoglycemics, low-dose Lantus and sliding scale , dose adjusted 02/17/19    CBG (last 3)  Recent Labs    02/17/19 0707 02/17/19 0727 02/17/19 0752  GLUCAP 49* 46* 130*     Condition -   Guarded  Family Communication  : Son & NIECE ON 02/17/19  Code Status : Full  Diet :   Diet Order            DIET SOFT Room service appropriate? Yes; Fluid consistency: Thin  Diet effective now               Disposition Plan  :  Inpt  Consults  :  None  Procedures  :     PUD Prophylaxis : Pepcid  DVT Prophylaxis  :  Lovenox   Lab Results  Component Value Date   PLT 122 (L) 02/17/2019    Inpatient Medications  Scheduled Meds:  aspirin  81 mg Oral Daily   clopidogrel  75 mg Oral Daily   donepezil  20 mg Oral QHS   enoxaparin (LOVENOX) injection  40 mg Subcutaneous Q24H   famotidine   20 mg Oral Daily   insulin aspart  0-15 Units Subcutaneous Q6H   insulin glargine  10 Units Subcutaneous QHS   tamsulosin  0.8 mg Oral QHS   Continuous Infusions:  cefTRIAXone (ROCEPHIN)  IV Stopped (02/16/19 2300)   lactated ringers 125 mL/hr at 02/17/19 0920   PRN Meds:.acetaminophen, dextrose, promethazine  Antibiotics  :    Anti-infectives (From admission, onward)   Start     Dose/Rate Route Frequency Ordered Stop   02/16/19 2200  cefTRIAXone (ROCEPHIN) 2 g in sodium chloride 0.9 % 100 mL IVPB     2 g 200 mL/hr over 30 Minutes Intravenous Every 24 hours 02/16/19 1827         Time Spent in minutes  30   Susa RaringPrashant Donzella Carrol M.D on 02/17/2019 at 9:52 AM  To page go to [email or web address] - password [email or web address]  [facility] -  Office  [phone]  See all Orders from today for further details    Objective:   Vitals:   02/16/19 2138 02/17/19 0000 02/17/19 0348 02/17/19 0724  BP: (!) 126/59 (!) 138/43 (!) 142/53 (!) 154/54  Pulse: 60 60 61 60  Resp:   20 18  Temp: (!) 97.4 F (36.3 C)  97.8 F (36.6 C) 97.7 F (36.5 C)  TempSrc: Axillary  Axillary Oral  SpO2: 100% 100% 99% 100%  Weight:      Height:        Wt Readings from Last  3 Encounters:  02/16/19 67.2 kg  02/15/19 72.6 kg  01/22/19 71.1 kg     Intake/Output Summary (Last 24 hours) at 02/17/2019 0952 Last data filed at 02/17/2019 0418 Gross per 24 hour  Intake 707.18 ml  Output --  Net 707.18 ml     Physical Exam  Awake Alert,  No new F.N deficits, Normal affect .AT,PERRAL Supple Neck,No JVD, No cervical lymphadenopathy appriciated.  Symmetrical Chest wall movement, Good air movement bilaterally, CTAB RRR,No Gallops,Rubs or new Murmurs, No Parasternal Heave +ve B.Sounds, Abd Soft, No tenderness, No organomegaly appriciated, No rebound - guarding or rigidity. No Cyanosis, Clubbing or edema, L arm in bandage       Data Review:    CBC Recent Labs  Lab 02/15/19 2038 02/16/19 0543  02/17/19 0558  WBC 9.8 20.0* 16.1*  HGB 11.4* 9.5* 9.8*  HCT 34.4* 27.7* 29.6*  PLT 151 123* 122*  MCV 89.6 87.7 90.8  MCH 29.7 30.1 30.1  MCHC 33.1 34.3 33.1  RDW 13.9 13.9 14.7  LYMPHSABS 0.1*  --  1.4  MONOABS 0.0*  --  0.5  EOSABS 0.0  --  0.0  BASOSABS 0.0  --  0.0    Chemistries  Recent Labs  Lab 02/15/19 2038 02/16/19 0543 02/17/19 0558  NA 132* 131* 136  K 3.8 4.9 3.8  CL 92* 99 103  CO2 21* 22 23  GLUCOSE 301* 338* 57*  BUN 25* 21 21  CREATININE 1.34* 1.28* 1.17  CALCIUM 8.2* 7.0* 7.6*  MG  --   --  2.0  AST 30 29 27   ALT 31 24 24   ALKPHOS 124 94 93  BILITOT 1.9* 1.3* 0.8   ------------------------------------------------------------------------------------------------------------------ No results for input(s): CHOL, HDL, LDLCALC, TRIG, CHOLHDL, LDLDIRECT in the last 72 hours.  Lab Results  Component Value Date   HGBA1C 7.9 (H) 02/17/2019   ------------------------------------------------------------------------------------------------------------------ No results for input(s): TSH, T4TOTAL, T3FREE, THYROIDAB in the last 72 hours.  Invalid input(s): FREET3  Cardiac Enzymes No results for input(s): CKMB, TROPONINI, MYOGLOBIN in the last 168 hours.  Invalid input(s): CK ------------------------------------------------------------------------------------------------------------------    Component Value Date/Time   BNP 1,239.0 (H) 10/26/2017 0359    Micro Results Recent Results (from the past 240 hour(s))  Urine culture     Status: Abnormal (Preliminary result)   Collection Time: 02/15/19  8:38 PM   Specimen: Urine, Random  Result Value Ref Range Status   Specimen Description   Final    URINE, RANDOM Performed at [facility], [address], [place], [place] [address]    Special Requests   Final    NONE Performed at [facility], [address] [place], [place] [address]    Culture (A)  Final    >=100,000 COLONIES/mL  GRAM NEGATIVE RODS IDENTIFICATION AND SUSCEPTIBILITIES TO FOLLOW Performed at [facility], [address], [place], [place] [address]    Report Status PENDING  Incomplete  Blood culture (routine x 2)     Status: Abnormal (Preliminary result)   Collection Time: 02/15/19  8:38 PM   Specimen: BLOOD  Result Value Ref Range Status   Specimen Description   Final    BLOOD RIGHT FOREARM Performed at [facility], [address], [place], [place] [address]    Special Requests   Final    BOTTLES DRAWN AEROBIC AND ANAEROBIC Blood Culture adequate volume Performed at [facility], [address] [place], [place] [address]    Culture  Setup Time  Final    Organism ID to follow GRAM NEGATIVE RODS IN BOTH AEROBIC AND ANAEROBIC BOTTLES CRITICAL RESULT CALLED TO, READ BACK BY AND VERIFIED WITH: [facility] GRUBB AT 1610 02/16/2019 SDR Performed at [facility], [address], [place], [place] [address]    Culture (A)  Final    KLEBSIELLA PNEUMONIAE SUSCEPTIBILITIES TO FOLLOW Performed at [facility], [address] [place], [place] [address]    Report Status PENDING  Incomplete  Blood culture (routine x 2)     Status: Abnormal (Preliminary result)   Collection Time: 02/15/19  8:38 PM   Specimen: BLOOD  Result Value Ref Range Status   Specimen Description   Final    BLOOD RIGHT HAND Performed at [facility], [address], [place], [place] [address]    Special Requests   Final    BOTTLES DRAWN AEROBIC AND ANAEROBIC Blood Culture adequate volume Performed at [facility], [address], [place], [place] [address]    Culture  Setup Time   Final    GRAM NEGATIVE RODS IN BOTH AEROBIC AND ANAEROBIC BOTTLES CRITICAL VALUE NOTED.  VALUE IS CONSISTENT WITH PREVIOUSLY REPORTED AND CALLED VALUE. Performed at [facility], [address], [place], [place] [address]    Culture (A)  Final    KLEBSIELLA  PNEUMONIAE SUSCEPTIBILITIES TO FOLLOW Performed at [facility], [address], [place], [place] [address]    Report Status PENDING  Incomplete  SARS Coronavirus 2 by RT PCR (hospital order, performed in [facility] hospital lab) Nasopharyngeal Urine, Clean Catch     Status: Abnormal   Collection Time: 02/15/19  8:38 PM   Specimen: Urine, Clean Catch; Nasopharyngeal  Result Value Ref Range Status   SARS Coronavirus 2 POSITIVE (A) NEGATIVE Final    Comment: RESULT CALLED TO, READ BACK BY AND VERIFIED WITH: SUSAN NEAL 02/15/2019 AT 2205 BY HS (NOTE) If result is NEGATIVE SARS-CoV-2 target nucleic acids are NOT DETECTED. The SARS-CoV-2 RNA is generally detectable in upper and lower  respiratory specimens during the acute phase of infection. The lowest  concentration of SARS-CoV-2 viral copies this assay can detect is 250  copies / mL. A negative result does not preclude SARS-CoV-2 infection  and should not be used as the sole basis for treatment or other  patient management decisions.  A negative result may occur with  improper specimen collection / handling, submission of specimen other  than nasopharyngeal swab, presence of viral mutation(s) within the  areas targeted by this assay, and inadequate number of viral copies  (<250 copies / mL). A negative result must be combined with clinical  observations, patient history, and epidemiological information. If result is POSITIVE SARS-CoV-2 target nucleic acids are DETECTED. T he SARS-CoV-2 RNA is generally detectable in upper and lower  respiratory specimens during the acute phase of infection.  Positive  results are indicative of active infection with SARS-CoV-2.  Clinical  correlation with patient history and other diagnostic information is  necessary to determine patient infection status.  Positive results do  not rule out bacterial infection or co-infection with other viruses. If result is PRESUMPTIVE POSTIVE SARS-CoV-2  nucleic acids MAY BE PRESENT.   A presumptive positive result was obtained on the submitted specimen  and confirmed on repeat testing.  While 2019 novel coronavirus  (SARS-CoV-2) nucleic acids may be present in the submitted sample  additional confirmatory testing may be necessary for epidemiological  and / or clinical management purposes  to differentiate between  SARS-CoV-2 and other Sarbecovirus currently known to infect humans.  If clinically indicated additional testing with an alternate test  methodology [phone]) is  advised. The SARS-CoV-2 RNA is generally  detectable in upper and lower respiratory specimens during the acute  phase of infection. The expected result is Negative. Fact Sheet for Patients:  BoilerBrush.com.cy Fact Sheet for Healthcare Providers: https://pope.com/ This test is not yet approved or cleared by the [place] FDA and has been authorized for detection and/or diagnosis of SARS-CoV-2 by FDA under an Emergency Use Authorization (EUA).  This EUA will remain in effect (meaning this test can be used) for the duration of the COVID-19 declaration under Section 564(b)(1) of the Act, 21 U.S.C. section 360bbb-3(b)(1), unless the authorization is terminated or revoked sooner. Performed at [facility], [address] [place], [place] [address]   Blood Culture ID Panel (Reflexed)     Status: Abnormal   Collection Time: 02/15/19  8:38 PM  Result Value Ref Range Status   Enterococcus species NOT DETECTED NOT DETECTED Final   Listeria monocytogenes NOT DETECTED NOT DETECTED Final   Staphylococcus species NOT DETECTED NOT DETECTED Final   Staphylococcus aureus (BCID) NOT DETECTED NOT DETECTED Final   Streptococcus species NOT DETECTED NOT DETECTED Final   Streptococcus agalactiae NOT DETECTED NOT DETECTED Final   Streptococcus pneumoniae NOT DETECTED NOT DETECTED Final   Streptococcus pyogenes NOT DETECTED  NOT DETECTED Final   Acinetobacter baumannii NOT DETECTED NOT DETECTED Final   Enterobacteriaceae species DETECTED (A) NOT DETECTED Final    Comment: Enterobacteriaceae represent a large family of gram-negative bacteria, not a single organism. CRITICAL RESULT CALLED TO, READ BACK BY AND VERIFIED WITH: RODNEY GRUBB AT 9562 02/16/2019 SDR    Enterobacter cloacae complex NOT DETECTED NOT DETECTED Final   Escherichia coli NOT DETECTED NOT DETECTED Final   Klebsiella oxytoca NOT DETECTED NOT DETECTED Final   Klebsiella pneumoniae DETECTED (A) NOT DETECTED Final    Comment: CRITICAL RESULT CALLED TO, READ BACK BY AND VERIFIED WITH:  RIODNEY GRUBB AT 0918 02/16/2019 SDR    Proteus species NOT DETECTED NOT DETECTED Final   Serratia marcescens NOT DETECTED NOT DETECTED Final   Carbapenem resistance NOT DETECTED NOT DETECTED Final   Haemophilus influenzae NOT DETECTED NOT DETECTED Final   Neisseria meningitidis NOT DETECTED NOT DETECTED Final   Pseudomonas aeruginosa NOT DETECTED NOT DETECTED Final   Candida albicans NOT DETECTED NOT DETECTED Final   Candida glabrata NOT DETECTED NOT DETECTED Final   Candida krusei NOT DETECTED NOT DETECTED Final   Candida parapsilosis NOT DETECTED NOT DETECTED Final   Candida tropicalis NOT DETECTED NOT DETECTED Final    Comment: Performed at [facility], [address], [place], [place] [address]    Radiology Reports Ct Head Wo Contrast  Result Date: 02/15/2019 CLINICAL DATA:  84 year old male with altered mental status. EXAM: CT HEAD WITHOUT CONTRAST TECHNIQUE: Contiguous axial images were obtained from the base of the skull through the vertex without intravenous contrast. COMPARISON:  Head CT dated 01/13/2019 FINDINGS: Brain: Moderate age-related atrophy and chronic microvascular ischemic changes. Left basal ganglia old lacunar infarct. There is no acute intracranial hemorrhage. No mass effect or midline shift. No extra-axial fluid collection.  Vascular: No hyperdense vessel or unexpected calcification. Skull: Normal. Negative for fracture or focal lesion. Sinuses/Orbits: Mild mucoperiosteal thickening of paranasal sinuses. Partially visualized left maxillary sinus retention cyst or polyp. Partial right mastoid effusion. The left mastoid air cells are clear. No  air-fluid level. Other: None IMPRESSION: 1. No acute intracranial hemorrhage. 2. Moderate age-related atrophy and chronic microvascular ischemic changes. Left basal ganglia old lacunar infarct. Electronically Signed   By: Elgie Collard M.D.   On: 02/15/2019 23:13   Ct Angio Chest Pe W And/or Wo Contrast  Result Date: 02/15/2019 CLINICAL DATA:  Complex chest pain.  Abdominal pain. EXAM: CT ANGIOGRAPHY CHEST CT ABDOMEN AND PELVIS WITH CONTRAST TECHNIQUE: Multidetector CT imaging of the chest was performed using the standard protocol during bolus administration of intravenous contrast. Multiplanar CT image reconstructions and MIPs were obtained to evaluate the vascular anatomy. Multidetector CT imaging of the abdomen and pelvis was performed using the standard protocol during bolus administration of intravenous contrast. CONTRAST:  OMNIPAQUE IOHEXOL 350 MG/ML SOLN COMPARISON:  CT dated January 28, 2019. FINDINGS: CTA CHEST FINDINGS Cardiovascular: Evaluation is limited by respiratory motion artifact. Given this limitation, no PE was identified. The heart size is enlarged. Aortic calcifications are noted. Coronary artery calcifications are noted. Mediastinum/Nodes: --No mediastinal or hilar lymphadenopathy. --No axillary lymphadenopathy. --No supraclavicular lymphadenopathy. --Normal thyroid gland. --The esophagus is unremarkable Lungs/Pleura: Again identified are scattered ground-glass airspace opacities bilaterally with some interval evolution since the prior study. The overall distribution is relatively similar to prior study. There is a new 2.5 by 1.5 cm cystic area within the left  upper lobe that demonstrates an air-fluid level. There is no pneumothorax. The trachea is unremarkable. There is some atelectasis at the lung bases. There is no significant pleural effusion. Musculoskeletal: No chest wall abnormality. No acute or significant osseous findings. Review of the MIP images confirms the above findings. CT ABDOMEN and PELVIS FINDINGS Hepatobiliary: The liver is normal. The gallbladder is distended with apparent gallbladder wall thickening, however this is not well evaluated secondary to motion artifact.There is mild intrahepatic and extrahepatic biliary ductal dilatation. Pancreas: Normal contours without ductal dilatation. No peripancreatic fluid collection. Spleen: No splenic laceration or hematoma. Adrenals/Urinary Tract: --Adrenal glands: No adrenal hemorrhage. --Right kidney/ureter: No hydronephrosis or perinephric hematoma. --Left kidney/ureter: No hydronephrosis or perinephric hematoma. --Urinary bladder: There is urinary bladder wall thickening with mild adjacent fat stranding. Stomach/Bowel: --Stomach/Duodenum: No hiatal hernia or other gastric abnormality. Normal duodenal course and caliber. --Small bowel: No dilatation or inflammation. --Colon: No focal abnormality. --Appendix: Normal. Vascular/Lymphatic: Atherosclerotic calcification is present within the non-aneurysmal abdominal aorta, without hemodynamically significant stenosis. --No retroperitoneal lymphadenopathy. --No mesenteric lymphadenopathy. --No pelvic or inguinal lymphadenopathy. Reproductive: The prostate gland is enlarged. Other: No ascites or free air. The abdominal wall is normal. Musculoskeletal. No acute displaced fractures. Review of the MIP images confirms the above findings. IMPRESSION: 1. Motion degraded studies. 2. Given the above limitation, no pulmonary embolus was detected. 3. Persistent bilateral ground-glass airspace opacities consistent with viral pneumonia. These have slightly improved from prior  studies. There is a new 2.5 cm pneumatocele in the left upper lobe as detailed above. 4. Distended gallbladder with gallbladder wall thickening. Findings are concerning for cholecystitis in the appropriate clinical setting. Follow-up with ultrasound is recommended. 5. Mild bladder wall thickening. Correlation with urinalysis is recommended to help exclude an underlying cystitis. Electronically Signed   By: Katherine Mantle M.D.   On: 02/15/2019 23:18   Ct Angio Chest Pe W Or Wo Contrast  Result Date: 01/28/2019 CLINICAL DATA:  84 year old male with concern for pulmonary embolism. Positive D-dimer. COVID-19 positive. EXAM: CT ANGIOGRAPHY CHEST WITH CONTRAST TECHNIQUE: Multidetector CT imaging of the chest was performed using the standard protocol during bolus administration of  intravenous contrast. Multiplanar CT image reconstructions and MIPs were obtained to evaluate the vascular anatomy. CONTRAST:  OMNIPAQUE IOHEXOL 350 MG/ML SOLN COMPARISON:  Chest radiograph dated 01/22/2019. FINDINGS: Cardiovascular: There is mild cardiomegaly. No pericardial effusion. Multi vessel coronary vascular calcification with involvement of the LAD, RCA, and left circumflex artery. Moderate atherosclerotic calcification of the thoracic aorta. No aneurysmal dilatation or dissection. There is diminutive appearance of the left vertebral artery. The origins of the remainder of the visualized great vessels of the aortic arch appear patent. Evaluation of the pulmonary arteries is somewhat limited due to respiratory motion artifact and suboptimal opacification and visualization of the peripheral branches. No pulmonary artery embolus identified. Mediastinum/Nodes: There is no hilar or mediastinal adenopathy. The esophagus is grossly unremarkable. Several small bilateral hypodense thyroid nodules noted. Ultrasound may provide better evaluation. No mediastinal fluid collection. Lungs/Pleura: Bilateral patchy airspace opacities  noted. Overall interval progression of the airspace opacities compared to the radiograph of 01/22/2019 when accounting for difference in technique. There are a spectrum of findings in the lungs which can be seen with acute atypical infection (as well as other non-infectious etiologies). In particular, viral pneumonia (including COVID-19) should be considered in the appropriate clinical setting. Small bilateral pleural effusions noted. There is no pneumothorax. The central airways are patent. Upper Abdomen: No acute abnormality. Musculoskeletal: Osteopenia with degenerative changes of the spine. No acute osseous pathology. Left pectoral pacemaker device noted. Review of the MIP images confirms the above findings. IMPRESSION: 1. No CT evidence of pulmonary embolism. 2. Bilateral patchy airspace opacities as well as small bilateral pleural effusions. Overall interval progression of the airspace opacities compared to the radiograph of 01/22/2019 when accounting for difference in technique. There are a spectrum of findings in the lungs which can be seen with acute atypical infection (as well as other non-infectious etiologies). In particular, viral pneumonia (including COVID) should be considered in the appropriate clinical setting. Aortic Atherosclerosis (ICD10-I70.0). Electronically Signed   By: Elgie Collard M.D.   On: 01/28/2019 17:11   Nm Hepatobiliary Liver Func  Result Date: 02/16/2019 CLINICAL DATA:  Altered mental status. Sludge, gallbladder distention, gallbladder wall thickening seen on an ultrasound from today. Cholecystitis suspected. EXAM: NUCLEAR MEDICINE HEPATOBILIARY IMAGING TECHNIQUE: Sequential images of the abdomen were obtained out to 60 minutes following intravenous administration of radiopharmaceutical. RADIOPHARMACEUTICALS:  5.44 mCi Tc-9m  Choletec IV COMPARISON:  CT scan February 15, 2019.  Ultrasound February 16, 2019. FINDINGS: There is prompt uptake and washout from the liver. The  gallbladder begins to fill at the end of the first hour of imaging. Filling significantly increases after morphine administration. There is normal excretion into the bowel. No other abnormalities. IMPRESSION: The gallbladder fills on this study excluding cystic duct obstruction. Electronically Signed   By: Gerome Sam III M.D   On: 02/16/2019 15:23   Ct Abdomen Pelvis W Contrast  Result Date: 02/15/2019 CLINICAL DATA:  Complex chest pain.  Abdominal pain. EXAM: CT ANGIOGRAPHY CHEST CT ABDOMEN AND PELVIS WITH CONTRAST TECHNIQUE: Multidetector CT imaging of the chest was performed using the standard protocol during bolus administration of intravenous contrast. Multiplanar CT image reconstructions and MIPs were obtained to evaluate the vascular anatomy. Multidetector CT imaging of the abdomen and pelvis was performed using the standard protocol during bolus administration of intravenous contrast. CONTRAST:  OMNIPAQUE IOHEXOL 350 MG/ML SOLN COMPARISON:  CT dated January 28, 2019. FINDINGS: CTA CHEST FINDINGS Cardiovascular: Evaluation is limited by respiratory motion artifact. Given this limitation, no PE  was identified. The heart size is enlarged. Aortic calcifications are noted. Coronary artery calcifications are noted. Mediastinum/Nodes: --No mediastinal or hilar lymphadenopathy. --No axillary lymphadenopathy. --No supraclavicular lymphadenopathy. --Normal thyroid gland. --The esophagus is unremarkable Lungs/Pleura: Again identified are scattered ground-glass airspace opacities bilaterally with some interval evolution since the prior study. The overall distribution is relatively similar to prior study. There is a new 2.5 by 1.5 cm cystic area within the left upper lobe that demonstrates an air-fluid level. There is no pneumothorax. The trachea is unremarkable. There is some atelectasis at the lung bases. There is no significant pleural effusion. Musculoskeletal: No chest wall abnormality. No acute or  significant osseous findings. Review of the MIP images confirms the above findings. CT ABDOMEN and PELVIS FINDINGS Hepatobiliary: The liver is normal. The gallbladder is distended with apparent gallbladder wall thickening, however this is not well evaluated secondary to motion artifact.There is mild intrahepatic and extrahepatic biliary ductal dilatation. Pancreas: Normal contours without ductal dilatation. No peripancreatic fluid collection. Spleen: No splenic laceration or hematoma. Adrenals/Urinary Tract: --Adrenal glands: No adrenal hemorrhage. --Right kidney/ureter: No hydronephrosis or perinephric hematoma. --Left kidney/ureter: No hydronephrosis or perinephric hematoma. --Urinary bladder: There is urinary bladder wall thickening with mild adjacent fat stranding. Stomach/Bowel: --Stomach/Duodenum: No hiatal hernia or other gastric abnormality. Normal duodenal course and caliber. --Small bowel: No dilatation or inflammation. --Colon: No focal abnormality. --Appendix: Normal. Vascular/Lymphatic: Atherosclerotic calcification is present within the non-aneurysmal abdominal aorta, without hemodynamically significant stenosis. --No retroperitoneal lymphadenopathy. --No mesenteric lymphadenopathy. --No pelvic or inguinal lymphadenopathy. Reproductive: The prostate gland is enlarged. Other: No ascites or free air. The abdominal wall is normal. Musculoskeletal. No acute displaced fractures. Review of the MIP images confirms the above findings. IMPRESSION: 1. Motion degraded studies. 2. Given the above limitation, no pulmonary embolus was detected. 3. Persistent bilateral ground-glass airspace opacities consistent with viral pneumonia. These have slightly improved from prior studies. There is a new 2.5 cm pneumatocele in the left upper lobe as detailed above. 4. Distended gallbladder with gallbladder wall thickening. Findings are concerning for cholecystitis in the appropriate clinical setting. Follow-up with ultrasound  is recommended. 5. Mild bladder wall thickening. Correlation with urinalysis is recommended to help exclude an underlying cystitis. Electronically Signed   By: Constance Holster M.D.   On: 02/15/2019 23:18   Dg Chest Portable 1 View  Result Date: 02/15/2019 CLINICAL DATA:  Altered mental status EXAM: PORTABLE CHEST 1 VIEW COMPARISON:  01/31/2019 FINDINGS: Again noted are patchy bilateral airspace opacities, not significantly changed from prior study. A dual chamber left-sided pacemaker is noted. There is no pneumothorax. No large pleural effusion. The heart size remains stable. There is no acute osseous abnormality. IMPRESSION: 1. No significant oval change. 2. Persistent multifocal airspace opacities consistent with the patient's history of viral pneumonia. Electronically Signed   By: Constance Holster M.D.   On: 02/15/2019 21:17   Dg Chest Port 1 View  Result Date: 01/31/2019 CLINICAL DATA:  COVID-19 pneumonia, dyspnea EXAM: PORTABLE CHEST 1 VIEW COMPARISON:  01/22/2019 chest radiograph. 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 Stable cardiomediastinal silhouette with top-normal heart size. No pneumothorax. No pleural effusion. Extensive patchy opacities throughout the peripheral lungs bilaterally, substantially worsened. IMPRESSION: Substantial worsening of extensive patchy lung opacities throughout the peripheral lungs bilaterally, compatible with COVID-19 pneumonia. Electronically Signed   By: Ilona Sorrel M.D.   On: 01/31/2019 13:05   Dg Chest Port 1 View  Result Date: 01/22/2019 CLINICAL DATA:  Covid positive. EXAM: PORTABLE CHEST 1  VIEW COMPARISON:  01/13/2019 FINDINGS: 1237 hours. Low lung volumes. Hazy airspace opacity at the right base is new in the interval. No pleural effusion. The cardiopericardial silhouette is within normal limits for size. Left-sided permanent pacemaker noted. Telemetry leads overlie the chest. IMPRESSION: New hazy airspace opacity at the  right base compatible with pneumonia. Electronically Signed   By: Kennith Center M.D.   On: 01/22/2019 13:52   Vas [place] Lower Extremity Venous (dvt)  Result Date: 01/29/2019  Lower Venous Study Indications: Edema. Other Indications: COVID. Anticoagulation: Enoxaparin. Performing Technologist: Leta Jungling RDCS  Examination Guidelines: A complete evaluation includes B-mode imaging, spectral Doppler, color Doppler, and power Doppler as needed of all accessible portions of each vessel. Bilateral testing is considered an integral part of a complete examination. Limited examinations for reoccurring indications may be performed as noted.  +---------+---------------+---------+-----------+----------+--------------+  RIGHT     Compressibility Phasicity Spontaneity Properties Thrombus Aging  +---------+---------------+---------+-----------+----------+--------------+  CFV       Full            Yes       Yes                                    +---------+---------------+---------+-----------+----------+--------------+  FV Prox   Full                                                             +---------+---------------+---------+-----------+----------+--------------+  FV Mid    Full                                                             +---------+---------------+---------+-----------+----------+--------------+  FV Distal Full                                                             +---------+---------------+---------+-----------+----------+--------------+  POP                                                        Not visualized  +---------+---------------+---------+-----------+----------+--------------+  PTV                                                        Not visualized  +---------+---------------+---------+-----------+----------+--------------+  PERO  Not visualized  +---------+---------------+---------+-----------+----------+--------------+    +---------+---------------+---------+-----------+----------+-------------------+  LEFT      Compressibility Phasicity Spontaneity Properties Thrombus Aging       +---------+---------------+---------+-----------+----------+-------------------+  CFV                       Yes       Yes                    unable to fully                                                                  compress. Patient                                                                flow visualized                                                                  with color and                                                                   spectral doppler     +---------+---------------+---------+-----------+----------+-------------------+  FV Prox   Full                                                                  +---------+---------------+---------+-----------+----------+-------------------+  FV Mid    Full                                                                  +---------+---------------+---------+-----------+----------+-------------------+  FV Distal Full                                                                  +---------+---------------+---------+-----------+----------+-------------------+  POP       Full            Yes       Yes                                         +---------+---------------+---------+-----------+----------+-------------------+  PTV                                                        Not visualized       +---------+---------------+---------+-----------+----------+-------------------+  PERO                                                       Not visualized       +---------+---------------+---------+-----------+----------+-------------------+     Summary: Right: There is no evidence of deep vein thrombosis in the lower extremity. However, portions of this examination were limited- see technologist comments above. Very difficult study du to patients altered mental status. Unable to  visualize Pop V, PTV, and Peroneal V. Left: There is no evidence of deep vein thrombosis in the lower extremity. However, portions of this examination were limited- see technologist comments above. No cystic structure found in the popliteal fossa. Unable to visualize the PTV and Pero V.  *See table(s) above for measurements and observations. Electronically signed by Gretta Began MD on 01/29/2019 at 3:06:59 PM.    Final    US Abdomen Limited Ruq  Result Date: 02/16/2019 CLINICAL DATA:  Abdominal pain with distended gallbladder seen on recent CT. EXAM: ULTRASOUND ABDOMEN LIMITED RIGHT UPPER QUADRANT COMPARISON:  02/15/2019 CT. FINDINGS: Gallbladder: There is gallbladder sludge. The gallbladder wall is thickened measuring approximately 4 mm in thickness. The sonographic Eulah Pont sign is reported as negative. The gallbladder is distended. Common bile duct: Diameter: 5 mm Liver: No focal lesion identified. Within normal limits in parenchymal echogenicity. Portal vein is patent on color Doppler imaging with normal direction of blood flow towards the liver. Other: None. IMPRESSION: Gallbladder sludge with gallbladder wall thickening. In the absence of a positive sonographic Murphy sign, these findings are equivocal for acute cholecystitis. If there is high clinical suspicion for acute cholecystitis, follow-up with HIDA scan is recommended. The gallbladder is distended. Electronically Signed   By: Katherine Mantle M.D.   On: 02/16/2019 00:31

## 2019-02-17 NOTE — Plan of Care (Signed)
Text Dr. To inform of critical lactic acid 2.5

## 2019-02-18 ENCOUNTER — Other Ambulatory Visit: Payer: Self-pay

## 2019-02-18 LAB — C-REACTIVE PROTEIN: CRP: 12.5 mg/dL — ABNORMAL HIGH (ref <1.0)

## 2019-02-18 LAB — CULTURE, BLOOD (ROUTINE X 2)
Special Requests: ADEQUATE
Special Requests: ADEQUATE

## 2019-02-18 LAB — GLUCOSE, CAPILLARY
Glucose-Capillary: 126 mg/dL — ABNORMAL HIGH (ref 70–99)
Glucose-Capillary: 150 mg/dL — ABNORMAL HIGH (ref 70–99)
Glucose-Capillary: 258 mg/dL — ABNORMAL HIGH (ref 70–99)
Glucose-Capillary: 272 mg/dL — ABNORMAL HIGH (ref 70–99)
Glucose-Capillary: 277 mg/dL — ABNORMAL HIGH (ref 70–99)
Glucose-Capillary: 406 mg/dL — ABNORMAL HIGH (ref 70–99)

## 2019-02-18 LAB — CBC WITH DIFFERENTIAL/PLATELET
Abs Immature Granulocytes: 0.2 10*3/uL — ABNORMAL HIGH (ref 0.00–0.07)
Basophils Absolute: 0 10*3/uL (ref 0.0–0.1)
Basophils Relative: 0 %
Eosinophils Absolute: 0 10*3/uL (ref 0.0–0.5)
Eosinophils Relative: 0 %
HCT: 26.5 % — ABNORMAL LOW (ref 39.0–52.0)
Hemoglobin: 8.9 g/dL — ABNORMAL LOW (ref 13.0–17.0)
Immature Granulocytes: 2 %
Lymphocytes Relative: 7 %
Lymphs Abs: 0.9 10*3/uL (ref 0.7–4.0)
MCH: 30.2 pg (ref 26.0–34.0)
MCHC: 33.6 g/dL (ref 30.0–36.0)
MCV: 89.8 fL (ref 80.0–100.0)
Monocytes Absolute: 0.5 10*3/uL (ref 0.1–1.0)
Monocytes Relative: 3 %
Neutro Abs: 12.2 10*3/uL — ABNORMAL HIGH (ref 1.7–7.7)
Neutrophils Relative %: 88 %
Platelets: 146 10*3/uL — ABNORMAL LOW (ref 150–400)
RBC: 2.95 MIL/uL — ABNORMAL LOW (ref 4.22–5.81)
RDW: 14.8 % (ref 11.5–15.5)
WBC: 13.8 10*3/uL — ABNORMAL HIGH (ref 4.0–10.5)
nRBC: 0 % (ref 0.0–0.2)

## 2019-02-18 LAB — MAGNESIUM: Magnesium: 1.9 mg/dL (ref 1.7–2.4)

## 2019-02-18 LAB — COMPREHENSIVE METABOLIC PANEL
ALT: 20 U/L (ref 0–44)
AST: 15 U/L (ref 15–41)
Albumin: 2 g/dL — ABNORMAL LOW (ref 3.5–5.0)
Alkaline Phosphatase: 88 U/L (ref 38–126)
Anion gap: 7 (ref 5–15)
BUN: 17 mg/dL (ref 8–23)
CO2: 24 mmol/L (ref 22–32)
Calcium: 7.6 mg/dL — ABNORMAL LOW (ref 8.9–10.3)
Chloride: 103 mmol/L (ref 98–111)
Creatinine, Ser: 0.95 mg/dL (ref 0.61–1.24)
GFR calc Af Amer: >60 mL/min (ref >60)
GFR calc non Af Amer: >60 mL/min (ref >60)
Glucose, Bld: 160 mg/dL — ABNORMAL HIGH (ref 70–99)
Potassium: 3.8 mmol/L (ref 3.5–5.1)
Sodium: 134 mmol/L — ABNORMAL LOW (ref 135–145)
Total Bilirubin: 0.5 mg/dL (ref 0.3–1.2)
Total Protein: 5 g/dL — ABNORMAL LOW (ref 6.5–8.1)

## 2019-02-18 LAB — BRAIN NATRIURETIC PEPTIDE: B Natriuretic Peptide: 624.9 pg/mL — ABNORMAL HIGH (ref 0.0–100.0)

## 2019-02-18 LAB — URINE CULTURE: Culture: >=100000 — AB

## 2019-02-18 LAB — D-DIMER, QUANTITATIVE: D-Dimer, Quant: 3.71 ug/mL-FEU — ABNORMAL HIGH (ref 0.00–0.50)

## 2019-02-18 LAB — PROCALCITONIN: Procalcitonin: 18.48 ng/mL

## 2019-02-18 LAB — LACTIC ACID, PLASMA: Lactic Acid, Venous: 1.9 mmol/L (ref 0.5–1.9)

## 2019-02-18 MED ORDER — AMLODIPINE BESYLATE 10 MG PO TABS
10.0000 mg | ORAL_TABLET | Freq: Every day | ORAL | Status: DC
  Administered 2019-02-18 – 2019-02-20 (×3): 10 mg via ORAL
  Filled 2019-02-18 (×3): qty 1

## 2019-02-18 MED ORDER — FUROSEMIDE 10 MG/ML IJ SOLN
40.0000 mg | Freq: Once | INTRAMUSCULAR | Status: AC
  Administered 2019-02-18: 40 mg via INTRAVENOUS
  Filled 2019-02-18: qty 4

## 2019-02-18 MED ORDER — INSULIN ASPART 100 UNIT/ML ~~LOC~~ SOLN
25.0000 [IU] | Freq: Once | SUBCUTANEOUS | Status: AC
  Administered 2019-02-18: 25 [IU] via SUBCUTANEOUS

## 2019-02-18 NOTE — Plan of Care (Signed)
  Problem: Education: Goal: Knowledge of risk factors and measures for prevention of condition will improve Outcome: Not Progressing Note: Confused

## 2019-02-18 NOTE — Progress Notes (Signed)
This 84 y/o male presents with the above. Pt with recent admit to [facility], d/c to SNF, had returned home and now readmitted to [facility]. Spoke with pt's niece who provided PLOF. Prior to initial hospitalization pt was ambulating with AD and supervision, receiving some assist for ADL. Since most recent d/c home from SNF pt has only been completing stand pivot transfers to/from wheelchair with assist, requires increased assist for ADL. Pt presenting with impaired cognition, weakness, decreased activity tolerance. He currently requires modA (+2 safety) for functional transfers using RW, maxA for LB and toileting ADL and minA for seated UB ADL. Pt confused during session but overall following simple commands given increased time. Pt on 2L with Spo2 >90% throughout. He will benefit from continued acute OT services, per chart and in speaking with pt's niece pt's family with preference for him to return home with 24hr assist and [facility] therapy services. Will follow.      02/18/19 1300  OT Visit Information  Last OT Received On 02/18/19  Assistance Needed +2 (+2 for mobility progression)  PT/OT/SLP Co-Evaluation/Treatment Yes  Reason for Co-Treatment For patient/therapist safety;To address functional/ADL transfers  PT goals addressed during session Mobility/safety with mobility  OT goals addressed during session ADL's and self-care  History of Present Illness Jesus [name]  is a 84 y.o. male, recently treated COVID-19 infection first day of diagnosis was 01/22/2019, mild dementia, DM type II, hypertension, bundle branch block, anemia of chronic disease, CAD on dual antiplatelet therapy who was admitted to [facility] for COVID-19 pneumonia 2 weeks ago and discharged  to SNF, Returned to home , to ED with fevers apparently more short of breath and more confused 02/16/19  Precautions  Precautions Fall  Restrictions  Weight Bearing Restrictions No  Home Living  Family/patient expects to be discharged to: Private residence  Living  Arrangements Other relatives (brother)  Available Help at Discharge Family  Additional Comments pt had recently d/c home from SNF  Prior Function  Level of Independence Needs assistance  Comments pt had recently d/c home from SNF, suspect requiring use of DME for mobility  Communication  Communication HOH  Pain Assessment  Pain Assessment No/denies pain  Cognition  Arousal/Alertness Awake/alert  Behavior During Therapy Flat affect  Overall Cognitive Status No family/caregiver present to determine baseline cognitive functioning  Area of Impairment Orientation;Attention;Memory;Following commands;Awareness  Orientation Level Disoriented to;Place;Time;Situation  Current Attention Level Selective  Memory Decreased recall of precautions;Decreased short-term memory  Following Commands Follows one step commands with increased time  Awareness Emergent  General Comments pt confused, does not recall recent events leading up to hospitalization and unaware he is in the hospital. able to tell therapist his name and DOB  Upper Extremity Assessment  Upper Extremity Assessment Generalized weakness;LUE deficits/detail  LUE Deficits / Details weeping noted at LUE, mild edema noted  Lower Extremity Assessment  Lower Extremity Assessment Defer to PT evaluation  ADL  Overall ADL's  Needs assistance/impaired  Eating/Feeding Set up;Sitting  Grooming Set up;Min guard;Sitting;Wash/dry face  Upper Body Bathing Minimal assistance;Sitting  Lower Body Bathing Moderate assistance;Maximal assistance;+2 for safety/equipment;Sit to/from stand;Sitting/lateral leans  Upper Body Dressing  Minimal assistance;Sitting;Cueing for sequencing  Lower Body Dressing Maximal assistance;+2 for safety/equipment;Sit to/from stand;Cueing for sequencing  Toilet Transfer Moderate assistance;+2 for safety/equipment;Stand-pivot;RW  Toilet Transfer Details (indicate cue type and reason) simulated via transfer to recliner  Toileting-  Clothing Manipulation and Hygiene Maximal assistance;+2 for safety/equipment;Sit to/from stand;Sitting/lateral lean  Toileting - Clothing Manipulation Details (indicate cue type and reason)  pt with condom cath leaking, provided assist for pericare at bed level  Functional mobility during ADLs Moderate assistance;+2 for safety/equipment;Rolling walker (stand pivot transfer)  General ADL Comments pt with impaired cognition, weakness, decreased activity tolerance; assisted with pericare and OOB to recliner during session  Bed Mobility  Overal bed mobility Needs Assistance  Bed Mobility Rolling;Sidelying to Sit  Rolling Mod assist  Sidelying to sit Mod assist;+2 for physical assistance;+2 for safety/equipment  General bed mobility comments rolling to L/R for pericare at bed level, assist for trunk elevation when transitioning to sitting EOB  Transfers  Overall transfer level Needs assistance  Equipment used Rolling walker (2 wheeled)  Transfers Sit to/from [employer] Transfers  Sit to Stand Mod assist;+2 safety/equipment  Stand pivot transfers Mod assist;+2 safety/equipment  General transfer comment boosting and steadying assist to rise to RW, cues for sequencing and assist for RW management, pt with poor eccentric control when transitioning to sitting  Balance  Overall balance assessment Needs assistance  Sitting-balance support Feet supported  Sitting balance-Leahy Scale Fair  Standing balance support Bilateral upper extremity supported  Standing balance-Leahy Scale Poor  OT - End of Session  Equipment Utilized During Treatment Oxygen;Rolling walker;Gait belt  Activity Tolerance Patient tolerated treatment well  Patient left in chair;with call bell/phone within reach;with chair alarm set  Nurse Communication Mobility status  OT Assessment  OT Recommendation/Assessment Patient needs continued OT Services  OT Visit Diagnosis Muscle weakness (generalized) (M62.81);Unsteadiness on  feet (R26.81);Other symptoms and signs involving cognitive function  OT Problem List Decreased strength;Decreased range of motion;Decreased activity tolerance;Impaired balance (sitting and/or standing);Decreased cognition;Decreased safety awareness;Decreased knowledge of use of DME or AE;Decreased knowledge of precautions;Cardiopulmonary status limiting activity  OT Plan  OT Frequency (ACUTE ONLY) Min 2X/week  OT Treatment/Interventions (ACUTE ONLY) Self-care/ADL training;Therapeutic exercise;Patient/family education;Balance training;Energy conservation;Therapeutic activities;DME and/or AE instruction;Cognitive remediation/compensation  AM-PAC OT "6 Clicks" Daily Activity Outcome Measure (Version 2)  Help from another person eating meals? 3  Help from another person taking care of personal grooming? 3  Help from another person toileting, which includes using toliet, bedpan, or urinal? 2  Help from another person bathing (including washing, rinsing, drying)? 2  Help from another person to put on and taking off regular upper body clothing? 3  Help from another person to put on and taking off regular lower body clothing? 2  6 Click Score 15  OT Recommendation  OT Equipment 3 in 1 bedside commode;Wheelchair (measurements OT);Wheelchair cushion (measurements OT)  Individuals Consulted  Consulted and Agree with Results and Recommendations Patient  Acute Rehab OT Goals  Patient Stated Goal none stated, pt agreeable to working with therapies  OT Goal Formulation With patient  Time For Goal Achievement 03/04/19  Potential to Achieve Goals Good  OT Time Calculation  OT Start Time (ACUTE ONLY) 0939  OT Stop Time (ACUTE ONLY) 1008  OT Time Calculation (min) 29 min  OT General Charges  $OT Visit 1 Visit  OT Evaluation  $OT Eval Moderate Complexity 1 Mod   Lou Cal, OT [employer] Pager [phone] Office [phone]

## 2019-02-18 NOTE — Progress Notes (Signed)
   02/18/19 8546  Family/Significant Other Communication  Family/Significant Other Update Called;Updated (Brother [place])

## 2019-02-18 NOTE — Progress Notes (Addendum)
PROGRESS NOTE                                                                                                                                                                                                             Patient Demographics:    Jesus [name], is a 84 y.o. male, DOB - Apr 22, 1934, WUJ:[id number]  Outpatient Primary MD for the patient is Barbette Reichmann, MD    LOS - 2  Admit date - 02/16/2019    CC - SEPSIS     Brief Narrative -    Jesus [name]  is a 84 y.o. male, recently treated COVID-19 infection first day of diagnosis was 01/22/2019, mild dementia, DM type II, hypertension, bundle branch block, anemia of chronic disease, CAD on dual antiplatelet therapy who was admitted to [facility] for COVID-19 pneumonia 2 weeks ago and discharged in good condition was brought back from SNF with fevers apparently more short of breath and more confused to [facility] ER.  In [place] he was diagnosed with Klebsiella sepsis due to UTI, cholecystitis was ruled out he was sent here for further treatment.  His respiratory status is stable.  Patient is currently mildly confused, he denies any headache chest or abdominal pain, he does not know where he is but able to answer appropriately basic questions and follow basic commands, denies any shortness of breath or any discomfort at this time.   Subjective:   Patient in bed, appears comfortable, denies any headache, no fever, no chest pain or pressure, no shortness of breath , no abdominal pain. No focal weakness.   Assessment  & Plan :     1.  Klebsiella sepsis and bacteremia from UTI causing toxic encephalopathy in a patient with underlying dementia, most likely source is urine.  Sepsis pathophysiology seems to have improved, continue IV fluids for hydration along with IV antibiotics.  There was suspicion that he had cholecystitis but this was ruled out after a negative HIDA scan with a stable  abdominal exam.    Fortunately he has responded very well to conservative treatment of IV fluids and IV antibiotics, sepsis pathophysiology has resolved and he is getting close to his baseline.  Likely switch him to oral antibiotics in the next 1 to 2 days and discharge him home as per family wishes.   2.  COVID-19 pneumonitis.  Clinically  treated appropriately 2 weeks ago.  Low dose steroids & monitor.  His CRP is likely elevated due to #1 above.  COVID-19 Labs  Recent Labs    02/15/19 2038 02/17/19 0558 02/17/19 0658 02/18/19 0608  DDIMER  --   --  6.71* 3.71*  CRP 1.5* 23.8*  --  12.5*    Lab Results  Component Value Date   SARSCOV2NAA POSITIVE (A) 02/15/2019     3.  Dementia with toxic encephalopathy.  At risk for delirium, minimize narcotics and benzodiazepines, supportive care if needed Haldol as needed.  To new home dose Namenda.  4.  Left arm skin allergy with multiple whelps and excoriated skin present on admission.  Wound care consult, looks like an allergic reaction, stop Zosyn, question if he had infiltrated IV previously, continue local wound care clinically stable.  5.  Mild AKI due to sepsis.  Hydrate and monitor.  Also monitor bladder scans to rule out any obstruction causing UTI and renal failure.  6.  CAD.  Continue dual antiplatelet therapy for now.  7.  GERD.  On Pepcid.  8.  Hypertension.  On Norvasc.  9.  DM type II.  Hold oral hypoglycemics, low-dose Lantus and sliding scale , dose adjusted 02/17/19    CBG (last 3)  Recent Labs    02/17/19 2108 02/18/19 0055 02/18/19 0611  GLUCAP 262* 258* 150*     Condition -   Guarded  Family Communication  : Brother on admission and 02/18/19 - want him to come Home with home health and not SNF.  Code Status : Full  Diet :   Diet Order            DIET SOFT Room service appropriate? Yes; Fluid consistency: Nectar Thick  Diet effective now               Disposition Plan  : Finally home  with home health, brother Joni Fears who takes care of him wants him to come home and not SNF.  Likely discharge in 1 to 2 days.  Consults  :  None  Procedures  :     PICC/Midline ordered - no access   PUD Prophylaxis : Pepcid  DVT Prophylaxis  :  Lovenox   Lab Results  Component Value Date   PLT 146 (L) 02/18/2019    Inpatient Medications  Scheduled Meds:  amLODipine  10 mg Oral Daily   aspirin  81 mg Oral Daily   clopidogrel  75 mg Oral Daily   dexamethasone (DECADRON) injection  4 mg Intravenous Q24H   donepezil  20 mg Oral QHS   enoxaparin (LOVENOX) injection  40 mg Subcutaneous Q24H   famotidine  20 mg Oral Daily   insulin aspart  0-5 Units Subcutaneous QHS   insulin aspart  0-9 Units Subcutaneous TID WC   insulin glargine  10 Units Subcutaneous QHS   tamsulosin  0.8 mg Oral QHS   Continuous Infusions:  cefTRIAXone (ROCEPHIN)  IV Stopped (02/17/19 2145)   PRN Meds:.acetaminophen, dextrose, promethazine, Resource ThickenUp Clear  Antibiotics  :    Anti-infectives (From admission, onward)   Start     Dose/Rate Route Frequency Ordered Stop   02/16/19 2200  cefTRIAXone (ROCEPHIN) 2 g in sodium chloride 0.9 % 100 mL IVPB     2 g 200 mL/hr over 30 Minutes Intravenous Every 24 hours 02/16/19 1827         Time Spent in minutes  30   Susa Raring M.D on 02/18/2019  at 10:44 AM  To page go to [email or web address] - password [email or web address]  [facility] -  Office  [phone]  See all Orders from today for further details    Objective:   Vitals:   02/17/19 2000 02/18/19 0000 02/18/19 0400 02/18/19 0710  BP: (!) 128/53 (!) 145/66 (!) 115/36 (!) 158/83  Pulse: 61 66 60 68  Resp:    18  Temp: 98.4 F (36.9 C)  98.7 F (37.1 C) 97.8 F (36.6 C)  TempSrc: Oral  Oral Oral  SpO2: 100% 99% 97% 97%  Weight:      Height:        Wt Readings from Last 3 Encounters:  02/16/19 67.2 kg  02/15/19 72.6 kg  01/22/19 71.1 kg     Intake/Output Summary (Last 24  hours) at 02/18/2019 1044 Last data filed at 02/18/2019 1028 Gross per 24 hour  Intake 1293.33 ml  Output 1800 ml  Net -506.67 ml     Physical Exam  Awake Alert,  No new F.N deficits, Normal affect [place].AT,PERRAL Supple Neck,No JVD, No cervical lymphadenopathy appriciated.  Symmetrical Chest wall movement, Good air movement bilaterally, CTAB RRR,No Gallops, Rubs or new Murmurs, No Parasternal Heave +ve B.Sounds, Abd Soft, No tenderness, No organomegaly appriciated, No rebound - guarding or rigidity. No Cyanosis, Clubbing or edema,  L arm in bandage       Data Review:    CBC Recent Labs  Lab 02/15/19 2038 02/16/19 0543 02/17/19 0558 02/18/19 0608  WBC 9.8 20.0* 16.1* 13.8*  HGB 11.4* 9.5* 9.8* 8.9*  HCT 34.4* 27.7* 29.6* 26.5*  PLT 151 123* 122* 146*  MCV 89.6 87.7 90.8 89.8  MCH 29.7 30.1 30.1 30.2  MCHC 33.1 34.3 33.1 33.6  RDW 13.9 13.9 14.7 14.8  LYMPHSABS 0.1*  --  1.4 0.9  MONOABS 0.0*  --  0.5 0.5  EOSABS 0.0  --  0.0 0.0  BASOSABS 0.0  --  0.0 0.0    Chemistries  Recent Labs  Lab 02/15/19 2038 02/16/19 0543 02/17/19 0558 02/18/19 0608  NA 132* 131* 136 134*  K 3.8 4.9 3.8 3.8  CL 92* 99 103 103  CO2 21* 22 23 24   GLUCOSE 301* 338* 57* 160*  BUN 25* 21 21 17   CREATININE 1.34* 1.28* 1.17 0.95  CALCIUM 8.2* 7.0* 7.6* 7.6*  MG  --   --  2.0 1.9  AST 30 29 27 15   ALT 31 24 24 20   ALKPHOS 124 94 93 88  BILITOT 1.9* 1.3* 0.8 0.5   ------------------------------------------------------------------------------------------------------------------ No results for input(s): CHOL, HDL, LDLCALC, TRIG, CHOLHDL, LDLDIRECT in the last 72 hours.  Lab Results  Component Value Date   HGBA1C 7.9 (H) 02/17/2019   ------------------------------------------------------------------------------------------------------------------ No results for input(s): TSH, T4TOTAL, T3FREE, THYROIDAB in the last 72 hours.  Invalid input(s): FREET3  Cardiac Enzymes No results  for input(s): CKMB, TROPONINI, MYOGLOBIN in the last 168 hours.  Invalid input(s): CK ------------------------------------------------------------------------------------------------------------------    Component Value Date/Time   BNP 624.9 (H) 02/18/2019    Micro Results Recent Results (from the past 240 hour(s))  Urine culture     Status: Abnormal   Collection Time: 02/15/19  8:38 PM   Specimen: Urine, Random  Result Value Ref Range Status   Specimen Description   Final    URINE, RANDOM Performed at [facility], [address] [place], 02/17/19 [facility]    Special Requests   Final    NONE Performed at [facility],  [address], [place], [place] [address]    Culture >=100,000 COLONIES/mL KLEBSIELLA PNEUMONIAE (A)  Final   Report Status 02/18/2019 FINAL  Final   Organism ID, Bacteria KLEBSIELLA PNEUMONIAE (A)  Final      Susceptibility   Klebsiella pneumoniae - MIC*    AMPICILLIN RESISTANT Resistant     CEFAZOLIN <=4 SENSITIVE Sensitive     CEFTRIAXONE <=1 SENSITIVE Sensitive     CIPROFLOXACIN <=0.25 SENSITIVE Sensitive     GENTAMICIN <=1 SENSITIVE Sensitive     IMIPENEM <=0.25 SENSITIVE Sensitive     NITROFURANTOIN 64 INTERMEDIATE Intermediate     TRIMETH/SULFA <=20 SENSITIVE Sensitive     AMPICILLIN/SULBACTAM <=2 SENSITIVE Sensitive     PIP/TAZO <=4 SENSITIVE Sensitive     Extended ESBL NEGATIVE Sensitive     * >=100,000 COLONIES/mL KLEBSIELLA PNEUMONIAE  Blood culture (routine x 2)     Status: Abnormal   Collection Time: 02/15/19  8:38 PM   Specimen: BLOOD  Result Value Ref Range Status   Specimen Description   Final    BLOOD RIGHT FOREARM Performed at [facility], [address] [place], [place] [address]    Special Requests   Final    BOTTLES DRAWN AEROBIC AND ANAEROBIC Blood Culture adequate volume Performed at [facility], [address] [place], [place] [address]    Culture  Setup Time   Final    GRAM  NEGATIVE RODS IN BOTH AEROBIC AND ANAEROBIC BOTTLES CRITICAL RESULT CALLED TO, READ BACK BY AND VERIFIED WITH: [facility] GRUBB AT 9147 02/16/2019 SDR Performed at [facility], [address], [place], [place] [address]    Culture KLEBSIELLA PNEUMONIAE (A)  Final   Report Status 02/18/2019 FINAL  Final   Organism ID, Bacteria KLEBSIELLA PNEUMONIAE  Final      Susceptibility   Klebsiella pneumoniae - MIC*    AMPICILLIN RESISTANT Resistant     CEFAZOLIN <=4 SENSITIVE Sensitive     CEFEPIME <=1 SENSITIVE Sensitive     CEFTAZIDIME <=1 SENSITIVE Sensitive     CEFTRIAXONE <=1 SENSITIVE Sensitive     CIPROFLOXACIN <=0.25 SENSITIVE Sensitive     GENTAMICIN <=1 SENSITIVE Sensitive     IMIPENEM 0.5 SENSITIVE Sensitive     TRIMETH/SULFA <=20 SENSITIVE Sensitive     AMPICILLIN/SULBACTAM <=2 SENSITIVE Sensitive     PIP/TAZO <=4 SENSITIVE Sensitive     * KLEBSIELLA PNEUMONIAE  Blood culture (routine x 2)     Status: Abnormal   Collection Time: 02/15/19  8:38 PM   Specimen: BLOOD  Result Value Ref Range Status   Specimen Description   Final    BLOOD RIGHT HAND Performed at [facility], [address], [place], [place] [address]    Special Requests   Final    BOTTLES DRAWN AEROBIC AND ANAEROBIC Blood Culture adequate volume Performed at [facility], [address] [place], [place] [address]    Culture  Setup Time   Final    GRAM NEGATIVE RODS IN BOTH AEROBIC AND ANAEROBIC BOTTLES CRITICAL VALUE NOTED.  VALUE IS CONSISTENT WITH PREVIOUSLY REPORTED AND CALLED VALUE. Performed at [facility], [address] [place], [place] [address]    Culture (A)  Final    KLEBSIELLA PNEUMONIAE SUSCEPTIBILITIES PERFORMED ON PREVIOUS CULTURE WITHIN THE LAST 5 DAYS. Performed at [facility], [address], [place], [place] [address]    Report Status 02/18/2019 FINAL  Final  SARS Coronavirus 2 by RT PCR (hospital order,  performed in [facility] hospital lab)  Nasopharyngeal Urine, Clean Catch     Status: Abnormal   Collection Time: 02/15/19  8:38 PM   Specimen: Urine, Clean Catch; Nasopharyngeal  Result Value Ref Range Status   SARS Coronavirus 2 POSITIVE (A) NEGATIVE Final    Comment: RESULT CALLED TO, READ BACK BY AND VERIFIED WITH: SUSAN NEAL 02/15/2019 AT 2205 BY HS (NOTE) If result is NEGATIVE SARS-CoV-2 target nucleic acids are NOT DETECTED. The SARS-CoV-2 RNA is generally detectable in upper and lower  respiratory specimens during the acute phase of infection. The lowest  concentration of SARS-CoV-2 viral copies this assay can detect is 250  copies / mL. A negative result does not preclude SARS-CoV-2 infection  and should not be used as the sole basis for treatment or other  patient management decisions.  A negative result may occur with  improper specimen collection / handling, submission of specimen other  than nasopharyngeal swab, presence of viral mutation(s) within the  areas targeted by this assay, and inadequate number of viral copies  (<250 copies / mL). A negative result must be combined with clinical  observations, patient history, and epidemiological information. If result is POSITIVE SARS-CoV-2 target nucleic acids are DETECTED. T he SARS-CoV-2 RNA is generally detectable in upper and lower  respiratory specimens during the acute phase of infection.  Positive  results are indicative of active infection with SARS-CoV-2.  Clinical  correlation with patient history and other diagnostic information is  necessary to determine patient infection status.  Positive results do  not rule out bacterial infection or co-infection with other viruses. If result is PRESUMPTIVE POSTIVE SARS-CoV-2 nucleic acids MAY BE PRESENT.   A presumptive positive result was obtained on the submitted specimen  and confirmed on repeat testing.  While 2019 novel coronavirus  (SARS-CoV-2) nucleic acids may be present in the submitted sample  additional  confirmatory testing may be necessary for epidemiological  and / or clinical management purposes  to differentiate between  SARS-CoV-2 and other Sarbecovirus currently known to infect humans.  If clinically indicated additional testing with an alternate test  methodology [phone]) is  advised. The SARS-CoV-2 RNA is generally  detectable in upper and lower respiratory specimens during the acute  phase of infection. The expected result is Negative. Fact Sheet for Patients:  BoilerBrush.com.cy Fact Sheet for Healthcare Providers: https://pope.com/ This test is not yet approved or cleared by the [place] FDA and has been authorized for detection and/or diagnosis of SARS-CoV-2 by FDA under an Emergency Use Authorization (EUA).  This EUA will remain in effect (meaning this test can be used) for the duration of the COVID-19 declaration under Section 564(b)(1) of the Act, 21 U.S.C. section 360bbb-3(b)(1), unless the authorization is terminated or revoked sooner. Performed at [facility], [address] [place], [place] [address]   Blood Culture ID Panel (Reflexed)     Status: Abnormal   Collection Time: 02/15/19  8:38 PM  Result Value Ref Range Status   Enterococcus species NOT DETECTED NOT DETECTED Final   Listeria monocytogenes NOT DETECTED NOT DETECTED Final   Staphylococcus species NOT DETECTED NOT DETECTED Final   Staphylococcus aureus (BCID) NOT DETECTED NOT DETECTED Final   Streptococcus species NOT DETECTED NOT DETECTED Final   Streptococcus agalactiae NOT DETECTED NOT DETECTED Final   Streptococcus pneumoniae NOT DETECTED NOT DETECTED Final   Streptococcus pyogenes NOT DETECTED NOT DETECTED Final   Acinetobacter baumannii NOT DETECTED NOT DETECTED Final   Enterobacteriaceae species DETECTED (  A) NOT DETECTED Final    Comment: Enterobacteriaceae represent a large family of gram-negative bacteria, not a single  organism. CRITICAL RESULT CALLED TO, READ BACK BY AND VERIFIED WITH: RODNEY GRUBB AT 1610 02/16/2019 SDR    Enterobacter cloacae complex NOT DETECTED NOT DETECTED Final   Escherichia coli NOT DETECTED NOT DETECTED Final   Klebsiella oxytoca NOT DETECTED NOT DETECTED Final   Klebsiella pneumoniae DETECTED (A) NOT DETECTED Final    Comment: CRITICAL RESULT CALLED TO, READ BACK BY AND VERIFIED WITH:  RIODNEY GRUBB AT 0918 02/16/2019 SDR    Proteus species NOT DETECTED NOT DETECTED Final   Serratia marcescens NOT DETECTED NOT DETECTED Final   Carbapenem resistance NOT DETECTED NOT DETECTED Final   Haemophilus influenzae NOT DETECTED NOT DETECTED Final   Neisseria meningitidis NOT DETECTED NOT DETECTED Final   Pseudomonas aeruginosa NOT DETECTED NOT DETECTED Final   Candida albicans NOT DETECTED NOT DETECTED Final   Candida glabrata NOT DETECTED NOT DETECTED Final   Candida krusei NOT DETECTED NOT DETECTED Final   Candida parapsilosis NOT DETECTED NOT DETECTED Final   Candida tropicalis NOT DETECTED NOT DETECTED Final    Comment: Performed at [facility], [address], [place], [place] [address]    Radiology Reports Ct Head Wo Contrast  Result Date: 02/15/2019 CLINICAL DATA:  84 year old male with altered mental status. EXAM: CT HEAD WITHOUT CONTRAST TECHNIQUE: Contiguous axial images were obtained from the base of the skull through the vertex without intravenous contrast. COMPARISON:  Head CT dated 01/13/2019 FINDINGS: Brain: Moderate age-related atrophy and chronic microvascular ischemic changes. Left basal ganglia old lacunar infarct. There is no acute intracranial hemorrhage. No mass effect or midline shift. No extra-axial fluid collection. Vascular: No hyperdense vessel or unexpected calcification. Skull: Normal. Negative for fracture or focal lesion. Sinuses/Orbits: Mild mucoperiosteal thickening of paranasal sinuses. Partially visualized left maxillary sinus retention cyst  or polyp. Partial right mastoid effusion. The left mastoid air cells are clear. No air-fluid level. Other: None IMPRESSION: 1. No acute intracranial hemorrhage. 2. Moderate age-related atrophy and chronic microvascular ischemic changes. Left basal ganglia old lacunar infarct. Electronically Signed   By: Elgie Collard M.D.   On: 02/15/2019 23:13   Ct Angio Chest Pe W And/or Wo Contrast  Result Date: 02/15/2019 CLINICAL DATA:  Complex chest pain.  Abdominal pain. EXAM: CT ANGIOGRAPHY CHEST CT ABDOMEN AND PELVIS WITH CONTRAST TECHNIQUE: Multidetector CT imaging of the chest was performed using the standard protocol during bolus administration of intravenous contrast. Multiplanar CT image reconstructions and MIPs were obtained to evaluate the vascular anatomy. Multidetector CT imaging of the abdomen and pelvis was performed using the standard protocol during bolus administration of intravenous contrast. CONTRAST:  OMNIPAQUE IOHEXOL 350 MG/ML SOLN COMPARISON:  CT dated January 28, 2019. FINDINGS: CTA CHEST FINDINGS Cardiovascular: Evaluation is limited by respiratory motion artifact. Given this limitation, no PE was identified. The heart size is enlarged. Aortic calcifications are noted. Coronary artery calcifications are noted. Mediastinum/Nodes: --No mediastinal or hilar lymphadenopathy. --No axillary lymphadenopathy. --No supraclavicular lymphadenopathy. --Normal thyroid gland. --The esophagus is unremarkable Lungs/Pleura: Again identified are scattered ground-glass airspace opacities bilaterally with some interval evolution since the prior study. The overall distribution is relatively similar to prior study. There is a new 2.5 by 1.5 cm cystic area within the left upper lobe that demonstrates an air-fluid level. There is no pneumothorax. The trachea is unremarkable. There is some atelectasis at the lung bases. There is no significant pleural effusion. Musculoskeletal: No  chest wall abnormality. No acute  or significant osseous findings. Review of the MIP images confirms the above findings. CT ABDOMEN and PELVIS FINDINGS Hepatobiliary: The liver is normal. The gallbladder is distended with apparent gallbladder wall thickening, however this is not well evaluated secondary to motion artifact.There is mild intrahepatic and extrahepatic biliary ductal dilatation. Pancreas: Normal contours without ductal dilatation. No peripancreatic fluid collection. Spleen: No splenic laceration or hematoma. Adrenals/Urinary Tract: --Adrenal glands: No adrenal hemorrhage. --Right kidney/ureter: No hydronephrosis or perinephric hematoma. --Left kidney/ureter: No hydronephrosis or perinephric hematoma. --Urinary bladder: There is urinary bladder wall thickening with mild adjacent fat stranding. Stomach/Bowel: --Stomach/Duodenum: No hiatal hernia or other gastric abnormality. Normal duodenal course and caliber. --Small bowel: No dilatation or inflammation. --Colon: No focal abnormality. --Appendix: Normal. Vascular/Lymphatic: Atherosclerotic calcification is present within the non-aneurysmal abdominal aorta, without hemodynamically significant stenosis. --No retroperitoneal lymphadenopathy. --No mesenteric lymphadenopathy. --No pelvic or inguinal lymphadenopathy. Reproductive: The prostate gland is enlarged. Other: No ascites or free air. The abdominal wall is normal. Musculoskeletal. No acute displaced fractures. Review of the MIP images confirms the above findings. IMPRESSION: 1. Motion degraded studies. 2. Given the above limitation, no pulmonary embolus was detected. 3. Persistent bilateral ground-glass airspace opacities consistent with viral pneumonia. These have slightly improved from prior studies. There is a new 2.5 cm pneumatocele in the left upper lobe as detailed above. 4. Distended gallbladder with gallbladder wall thickening. Findings are concerning for cholecystitis in the appropriate clinical setting. Follow-up with  ultrasound is recommended. 5. Mild bladder wall thickening. Correlation with urinalysis is recommended to help exclude an underlying cystitis. Electronically Signed   By: Katherine Mantle M.D.   On: 02/15/2019 23:18   Ct Angio Chest Pe W Or Wo Contrast  Result Date: 01/28/2019 CLINICAL DATA:  84 year old male with concern for pulmonary embolism. Positive D-dimer. COVID-19 positive. EXAM: CT ANGIOGRAPHY CHEST WITH CONTRAST TECHNIQUE: Multidetector CT imaging of the chest was performed using the standard protocol during bolus administration of intravenous contrast. Multiplanar CT image reconstructions and MIPs were obtained to evaluate the vascular anatomy. CONTRAST:  OMNIPAQUE IOHEXOL 350 MG/ML SOLN COMPARISON:  Chest radiograph dated 01/22/2019. FINDINGS: Cardiovascular: There is mild cardiomegaly. No pericardial effusion. Multi vessel coronary vascular calcification with involvement of the LAD, RCA, and left circumflex artery. Moderate atherosclerotic calcification of the thoracic aorta. No aneurysmal dilatation or dissection. There is diminutive appearance of the left vertebral artery. The origins of the remainder of the visualized great vessels of the aortic arch appear patent. Evaluation of the pulmonary arteries is somewhat limited due to respiratory motion artifact and suboptimal opacification and visualization of the peripheral branches. No pulmonary artery embolus identified. Mediastinum/Nodes: There is no hilar or mediastinal adenopathy. The esophagus is grossly unremarkable. Several small bilateral hypodense thyroid nodules noted. Ultrasound may provide better evaluation. No mediastinal fluid collection. Lungs/Pleura: Bilateral patchy airspace opacities noted. Overall interval progression of the airspace opacities compared to the radiograph of 01/22/2019 when accounting for difference in technique. There are a spectrum of findings in the lungs which can be seen with acute atypical infection  (as well as other non-infectious etiologies). In particular, viral pneumonia (including COVID-19) should be considered in the appropriate clinical setting. Small bilateral pleural effusions noted. There is no pneumothorax. The central airways are patent. Upper Abdomen: No acute abnormality. Musculoskeletal: Osteopenia with degenerative changes of the spine. No acute osseous pathology. Left pectoral pacemaker device noted. Review of the MIP images confirms the above findings. IMPRESSION: 1. No CT evidence of pulmonary  embolism. 2. Bilateral patchy airspace opacities as well as small bilateral pleural effusions. Overall interval progression of the airspace opacities compared to the radiograph of 01/22/2019 when accounting for difference in technique. There are a spectrum of findings in the lungs which can be seen with acute atypical infection (as well as other non-infectious etiologies). In particular, viral pneumonia (including COVID) should be considered in the appropriate clinical setting. Aortic Atherosclerosis (ICD10-I70.0). Electronically Signed   By: Elgie CollardArash  Radparvar M.D.   On: 01/28/2019 17:11   Nm Hepatobiliary Liver Func  Result Date: 02/16/2019 CLINICAL DATA:  Altered mental status. Sludge, gallbladder distention, gallbladder wall thickening seen on an ultrasound from today. Cholecystitis suspected. EXAM: NUCLEAR MEDICINE HEPATOBILIARY IMAGING TECHNIQUE: Sequential images of the abdomen were obtained out to 60 minutes following intravenous administration of radiopharmaceutical. RADIOPHARMACEUTICALS:  5.44 mCi Tc-2782m  Choletec IV COMPARISON:  CT scan February 15, 2019.  Ultrasound February 16, 2019. FINDINGS: There is prompt uptake and washout from the liver. The gallbladder begins to fill at the end of the first hour of imaging. Filling significantly increases after morphine administration. There is normal excretion into the bowel. No other abnormalities. IMPRESSION: The gallbladder fills on this study  excluding cystic duct obstruction. Electronically Signed   By: Gerome Samavid  Williams III M.D   On: 02/16/2019 15:23   Ct Abdomen Pelvis W Contrast  Result Date: 02/15/2019 CLINICAL DATA:  Complex chest pain.  Abdominal pain. EXAM: CT ANGIOGRAPHY CHEST CT ABDOMEN AND PELVIS WITH CONTRAST TECHNIQUE: Multidetector CT imaging of the chest was performed using the standard protocol during bolus administration of intravenous contrast. Multiplanar CT image reconstructions and MIPs were obtained to evaluate the vascular anatomy. Multidetector CT imaging of the abdomen and pelvis was performed using the standard protocol during bolus administration of intravenous contrast. CONTRAST:  100mL OMNIPAQUE IOHEXOL 350 MG/ML SOLN COMPARISON:  CT dated January 28, 2019. FINDINGS: CTA CHEST FINDINGS Cardiovascular: Evaluation is limited by respiratory motion artifact. Given this limitation, no PE was identified. The heart size is enlarged. Aortic calcifications are noted. Coronary artery calcifications are noted. Mediastinum/Nodes: --No mediastinal or hilar lymphadenopathy. --No axillary lymphadenopathy. --No supraclavicular lymphadenopathy. --Normal thyroid gland. --The esophagus is unremarkable Lungs/Pleura: Again identified are scattered ground-glass airspace opacities bilaterally with some interval evolution since the prior study. The overall distribution is relatively similar to prior study. There is a new 2.5 by 1.5 cm cystic area within the left upper lobe that demonstrates an air-fluid level. There is no pneumothorax. The trachea is unremarkable. There is some atelectasis at the lung bases. There is no significant pleural effusion. Musculoskeletal: No chest wall abnormality. No acute or significant osseous findings. Review of the MIP images confirms the above findings. CT ABDOMEN and PELVIS FINDINGS Hepatobiliary: The liver is normal. The gallbladder is distended with apparent gallbladder wall thickening, however this is not  well evaluated secondary to motion artifact.There is mild intrahepatic and extrahepatic biliary ductal dilatation. Pancreas: Normal contours without ductal dilatation. No peripancreatic fluid collection. Spleen: No splenic laceration or hematoma. Adrenals/Urinary Tract: --Adrenal glands: No adrenal hemorrhage. --Right kidney/ureter: No hydronephrosis or perinephric hematoma. --Left kidney/ureter: No hydronephrosis or perinephric hematoma. --Urinary bladder: There is urinary bladder wall thickening with mild adjacent fat stranding. Stomach/Bowel: --Stomach/Duodenum: No hiatal hernia or other gastric abnormality. Normal duodenal course and caliber. --Small bowel: No dilatation or inflammation. --Colon: No focal abnormality. --Appendix: Normal. Vascular/Lymphatic: Atherosclerotic calcification is present within the non-aneurysmal abdominal aorta, without hemodynamically significant stenosis. --No retroperitoneal lymphadenopathy. --No mesenteric lymphadenopathy. --No pelvic or inguinal  lymphadenopathy. Reproductive: The prostate gland is enlarged. Other: No ascites or free air. The abdominal wall is normal. Musculoskeletal. No acute displaced fractures. Review of the MIP images confirms the above findings. IMPRESSION: 1. Motion degraded studies. 2. Given the above limitation, no pulmonary embolus was detected. 3. Persistent bilateral ground-glass airspace opacities consistent with viral pneumonia. These have slightly improved from prior studies. There is a new 2.5 cm pneumatocele in the left upper lobe as detailed above. 4. Distended gallbladder with gallbladder wall thickening. Findings are concerning for cholecystitis in the appropriate clinical setting. Follow-up with ultrasound is recommended. 5. Mild bladder wall thickening. Correlation with urinalysis is recommended to help exclude an underlying cystitis. Electronically Signed   By: Katherine Mantle M.D.   On: 02/15/2019 23:18   Dg Chest Portable 1  View  Result Date: 02/15/2019 CLINICAL DATA:  Altered mental status EXAM: PORTABLE CHEST 1 VIEW COMPARISON:  01/31/2019 FINDINGS: Again noted are patchy bilateral airspace opacities, not significantly changed from prior study. A dual chamber left-sided pacemaker is noted. There is no pneumothorax. No large pleural effusion. The heart size remains stable. There is no acute osseous abnormality. IMPRESSION: 1. No significant oval change. 2. Persistent multifocal airspace opacities consistent with the patient's history of viral pneumonia. Electronically Signed   By: Katherine Mantle M.D.   On: 02/15/2019 21:17   Dg Chest Port 1 View  Result Date: 01/31/2019 CLINICAL DATA:  COVID-19 pneumonia, dyspnea EXAM: PORTABLE CHEST 1 VIEW COMPARISON:  01/22/2019 chest radiograph. 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 Stable cardiomediastinal silhouette with top-normal heart size. No pneumothorax. No pleural effusion. Extensive patchy opacities throughout the peripheral lungs bilaterally, substantially worsened. IMPRESSION: Substantial worsening of extensive patchy lung opacities throughout the peripheral lungs bilaterally, compatible with COVID-19 pneumonia. Electronically Signed   By: Delbert Phenix M.D.   On: 01/31/2019 13:05   Dg Chest Port 1 View  Result Date: 01/22/2019 CLINICAL DATA:  Covid positive. EXAM: PORTABLE CHEST 1 VIEW COMPARISON:  01/13/2019 FINDINGS: 1237 hours. Low lung volumes. Hazy airspace opacity at the right base is new in the interval. No pleural effusion. The cardiopericardial silhouette is within normal limits for size. Left-sided permanent pacemaker noted. Telemetry leads overlie the chest. IMPRESSION: New hazy airspace opacity at the right base compatible with pneumonia. Electronically Signed   By: Kennith Center M.D.   On: 01/22/2019 13:52   Vas [place] Lower Extremity Venous (dvt)  Result Date: 01/29/2019  Lower Venous Study Indications: Edema. Other  Indications: COVID. Anticoagulation: Enoxaparin. Performing Technologist: Leta Jungling RDCS  Examination Guidelines: A complete evaluation includes B-mode imaging, spectral Doppler, color Doppler, and power Doppler as needed of all accessible portions of each vessel. Bilateral testing is considered an integral part of a complete examination. Limited examinations for reoccurring indications may be performed as noted.  +---------+---------------+---------+-----------+----------+--------------+  RIGHT     Compressibility Phasicity Spontaneity Properties Thrombus Aging  +---------+---------------+---------+-----------+----------+--------------+  CFV       Full            Yes       Yes                                    +---------+---------------+---------+-----------+----------+--------------+  FV Prox   Full                                                             +---------+---------------+---------+-----------+----------+--------------+  FV Mid    Full                                                             +---------+---------------+---------+-----------+----------+--------------+  FV Distal Full                                                             +---------+---------------+---------+-----------+----------+--------------+  POP                                                        Not visualized  +---------+---------------+---------+-----------+----------+--------------+  PTV                                                        Not visualized  +---------+---------------+---------+-----------+----------+--------------+  PERO                                                       Not visualized  +---------+---------------+---------+-----------+----------+--------------+   +---------+---------------+---------+-----------+----------+-------------------+  LEFT      Compressibility Phasicity Spontaneity Properties Thrombus Aging        +---------+---------------+---------+-----------+----------+-------------------+  CFV                       Yes       Yes                    unable to fully                                                                  compress. Patient                                                                flow visualized                                                                  with color and  spectral doppler     +---------+---------------+---------+-----------+----------+-------------------+  FV Prox   Full                                                                  +---------+---------------+---------+-----------+----------+-------------------+  FV Mid    Full                                                                  +---------+---------------+---------+-----------+----------+-------------------+  FV Distal Full                                                                  +---------+---------------+---------+-----------+----------+-------------------+  POP       Full            Yes       Yes                                         +---------+---------------+---------+-----------+----------+-------------------+  PTV                                                        Not visualized       +---------+---------------+---------+-----------+----------+-------------------+  PERO                                                       Not visualized       +---------+---------------+---------+-----------+----------+-------------------+     Summary: Right: There is no evidence of deep vein thrombosis in the lower extremity. However, portions of this examination were limited- see technologist comments above. Very difficult study du to patients altered mental status. Unable to visualize Pop V, PTV, and Peroneal V. Left: There is no evidence of deep vein thrombosis in the lower extremity. However, portions of this examination were limited-  see technologist comments above. No cystic structure found in the popliteal fossa. Unable to visualize the PTV and Pero V.  *See table(s) above for measurements and observations. Electronically signed by Gretta Began MD on 01/29/2019 at 3:06:59 PM.    Final    US Abdomen Limited Ruq  Result Date: 02/16/2019 CLINICAL DATA:  Abdominal pain with distended gallbladder seen on recent CT. EXAM: ULTRASOUND ABDOMEN LIMITED RIGHT UPPER QUADRANT COMPARISON:  02/15/2019 CT. FINDINGS: Gallbladder: There is gallbladder sludge. The gallbladder wall is thickened measuring approximately 4 mm in thickness. The sonographic Eulah Pont sign is reported as negative. The gallbladder is distended. Common bile duct: Diameter:  5 mm Liver: No focal lesion identified. Within normal limits in parenchymal echogenicity. Portal vein is patent on color Doppler imaging with normal direction of blood flow towards the liver. Other: None. IMPRESSION: Gallbladder sludge with gallbladder wall thickening. In the absence of a positive sonographic Murphy sign, these findings are equivocal for acute cholecystitis. If there is high clinical suspicion for acute cholecystitis, follow-up with HIDA scan is recommended. The gallbladder is distended. Electronically Signed   By: Constance Holster M.D.   On: 02/16/2019 00:31

## 2019-02-18 NOTE — Evaluation (Addendum)
Physical Therapy Evaluation Patient Details Name: Jesus [name] MRN: [id number] DOB: Apr 26, 1934 Today's Date: 02/18/2019   History of Present Illness  Jesus [name]  is a 84 y.o. male, recently treated COVID-19 infection first day of diagnosis was 01/22/2019, mild dementia, DM type II, hypertension, bundle branch block, anemia of chronic disease, CAD on dual antiplatelet therapy who was admitted to [facility] for COVID-19 pneumonia 2 weeks ago and discharged  to SNF, Returned to home , to ED with fevers apparently more short of breath and more confused 02/16/19  Clinical Impression  Patient is well known to PT from previous,recent , admission. The Patient is pleasantly confused, did participate in mobilizing  With 1 mod assist to get up to recliner.Will need to communicate with family about returning to home vs. Back to SNF.  Pt admitted with above diagnosis. Pt currently with functional limitations due to the deficits listed below (see PT Problem List). Pt will benefit from skilled PT to increase their independence and safety with mobility to allow discharge to the venue listed below.       Follow Up Recommendations Supervision/Assistance - 24 hours- Per OT conversation with niece, plans for patient to return to brothers home.    Equipment Recommendations  None recommended by PT    Recommendations for Other Services       Precautions / Restrictions Precautions Precautions: Fall Precaution Comments: HOH, watch sats, now On O2 Restrictions Weight Bearing Restrictions: No      Mobility  Bed Mobility Overal bed mobility: Needs Assistance Bed Mobility: Rolling;Sidelying to Sit Rolling: Mod assist Sidelying to sit: Mod assist       General bed mobility comments: multimodal sequencing cues to problem solve motion, needing cues for initiation as well. Assist legs and trunk for sitting upright  Transfers Overall transfer level: Needs assistance Equipment used: Rolling walker (2  wheeled) Transfers: Sit to/from Stand Sit to Stand: Mod assist Stand pivot transfers: Mod assist       General transfer comment: assist to rise from bed, cues for use of RW, several steps to recliner, cues for hand placement.  Decreased control of descent.  Ambulation/Gait                Stairs            Wheelchair Mobility    Modified Rankin (Stroke Patients Only)       Balance Overall balance assessment: Needs assistance Sitting-balance support: Feet supported Sitting balance-Leahy Scale: Fair     Standing balance support: Bilateral upper extremity supported Standing balance-Leahy Scale: Poor Standing balance comment: reliant on external support                             Pertinent Vitals/Pain Pain Assessment: No/denies pain Faces Pain Scale: No hurt Pain Location: .    Home Living Family/patient expects to be discharged to:: Private residence Living Arrangements: Other relatives Available Help at Discharge: Family             Additional Comments: pt had recently d/c home from SNF    Prior Function Level of Independence: Needs assistance         Comments: pt had recently d/c home from SNF, suspect requiring use of DME for mobility     Hand Dominance        Extremity/Trunk Assessment   Upper Extremity Assessment Upper Extremity Assessment: Defer to OT evaluation LUE Deficits / Details: weeping noted at LUE, mild  edema noted    Lower Extremity Assessment Lower Extremity Assessment: Generalized weakness    Cervical / Trunk Assessment Cervical / Trunk Assessment: Kyphotic  Communication   Communication: HOH  Cognition Arousal/Alertness: Awake/alert Behavior During Therapy: Flat affect Overall Cognitive Status: History of cognitive impairments - at baseline Area of Impairment: Orientation                 Orientation Level: Place;Time;Situation(stated" I don't know why i am here") Current Attention Level:  Selective Memory: Decreased short-term memory;Decreased recall of precautions Following Commands: Follows one step commands consistently   Awareness: Emergent   General Comments: patient awake, answers questions but  not correct as patiwent does not live alone./      General Comments      Exercises     Assessment/Plan    PT Assessment Patient needs continued PT services  PT Problem List Decreased strength;Decreased activity tolerance;Decreased balance;Decreased coordination;Decreased safety awareness       PT Treatment Interventions DME instruction;Gait training;Functional mobility training;Therapeutic exercise;Therapeutic activities;Cognitive remediation;Patient/family education    PT Goals (Current goals can be found in the Care Plan section)  Acute Rehab PT Goals Patient Stated Goal: none stated, pt agreeable to working with therapies PT Goal Formulation: Patient unable to participate in goal setting Time For Goal Achievement: 03/04/19 Potential to Achieve Goals: Fair    Frequency Min 2X/week   Barriers to discharge        Co-evaluation PT/OT/SLP Co-Evaluation/Treatment: Yes Reason for Co-Treatment: For patient/therapist safety PT goals addressed during session: Mobility/safety with mobility OT goals addressed during session: ADL's and self-care       AM-PAC PT "6 Clicks" Mobility  Outcome Measure Help needed turning from your back to your side while in a flat bed without using bedrails?: A Lot Help needed moving from lying on your back to sitting on the side of a flat bed without using bedrails?: A Lot Help needed moving to and from a bed to a chair (including a wheelchair)?: A Lot Help needed standing up from a chair using your arms (e.g., wheelchair or bedside chair)?: A Lot Help needed to walk in hospital room?: Total Help needed climbing 3-5 steps with a railing? : Total 6 Click Score: 10    End of Session Equipment Utilized During Treatment:  Oxygen Activity Tolerance: Patient limited by fatigue Patient left: in chair;with call bell/phone within reach;with chair alarm set Nurse Communication: Mobility status;Precautions PT Visit Diagnosis: Muscle weakness (generalized) (M62.81);Other abnormalities of gait and mobility (R26.89)    Time: 0940-1008 PT Time Calculation (min) (ACUTE ONLY): 28 min   Charges:   PT Evaluation $PT Eval Moderate Complexity: Crystal 8 Office [phone]   Claretha Cooper 02/18/2019, 1:39 PM

## 2019-02-18 NOTE — Progress Notes (Signed)
  Speech Language Pathology Treatment: Dysphagia  Patient Details Name: Jesus [name] MRN: [id number] DOB: [birth year] Today's Date: 02/18/2019 Time: 0921-0932 SLP Time Calculation (min) (ACUTE ONLY): 11 min  Assessment / Plan / Recommendation Clinical Impression  Pt was eating breakfast meal upon SLP arrival, so trials of water protocol were deferred. He had bacon on his soft diet tray, and this gave him mild difficulty with mastication and oral clearance. Min cues were provided. More so than any oral deficits, softer foods had been recommended more recently for his suspected esophageal issues, so SLP adjusted diet from "soft" to Dys 3 (mechanical soft) to better reflect these preferred textures. Otherwise, pt consumed almost a whole carton of nectar-thick milk with no overt s/s of aspiration, even if he took large boluses at a time. Will continue to follow for potential advancement.   HPI HPI: Pt is an 84 yo male who was recently treated for COVID-19 (first day of dx 10/7), but returns to the hospital for increased SOB, AMS, and fevers. Pt found to have UTI. During recent admission pt was evaluated by SLP given reports of desaturations after PO intake. MBS on 10/19 revealed trace aspiration with thin liquids that elicited a cough. There was also concern for esophageal backflow. Dys 2 diet and nectar thick liquids recommended as well as water protocol in between meals. PMH includes: dementia, DM, HTN, BBB, anemia, CAD, GERD      SLP Plan  Continue with current plan of care       Recommendations  Diet recommendations: Dysphagia 3 (mechanical soft);Nectar-thick liquid Liquids provided via: Cup;Straw Medication Administration: Whole meds with puree Supervision: Patient able to self feed;Staff to assist with self feeding Compensations: Slow rate;Small sips/bites Postural Changes and/or Swallow Maneuvers: Seated upright 90 degrees                Oral Care Recommendations: Oral care  BID Follow up Recommendations: Skilled Nursing facility SLP Visit Diagnosis: Dysphagia, oropharyngeal phase (R13.12) Plan: Continue with current plan of care       GO                [name] Matther Labell 02/18/2019, 10:54 AM  [name], M.A. [place] Acute Environmental education officer [phone] Office [phone]

## 2019-02-19 DIAGNOSIS — F0390 Unspecified dementia without behavioral disturbance: Secondary | ICD-10-CM

## 2019-02-19 DIAGNOSIS — A414 Sepsis due to anaerobes: Secondary | ICD-10-CM

## 2019-02-19 DIAGNOSIS — G9349 Other encephalopathy: Secondary | ICD-10-CM

## 2019-02-19 DIAGNOSIS — I10 Essential (primary) hypertension: Secondary | ICD-10-CM

## 2019-02-19 DIAGNOSIS — U071 COVID-19: Secondary | ICD-10-CM

## 2019-02-19 LAB — COMPREHENSIVE METABOLIC PANEL
ALT: 19 U/L (ref 0–44)
AST: 14 U/L — ABNORMAL LOW (ref 15–41)
Albumin: 2.4 g/dL — ABNORMAL LOW (ref 3.5–5.0)
Alkaline Phosphatase: 85 U/L (ref 38–126)
Anion gap: 10 (ref 5–15)
BUN: 17 mg/dL (ref 8–23)
CO2: 25 mmol/L (ref 22–32)
Calcium: 7.8 mg/dL — ABNORMAL LOW (ref 8.9–10.3)
Chloride: 98 mmol/L (ref 98–111)
Creatinine, Ser: 0.89 mg/dL (ref 0.61–1.24)
GFR calc Af Amer: >60 mL/min (ref >60)
GFR calc non Af Amer: >60 mL/min (ref >60)
Glucose, Bld: 60 mg/dL — ABNORMAL LOW (ref 70–99)
Potassium: 3.6 mmol/L (ref 3.5–5.1)
Sodium: 133 mmol/L — ABNORMAL LOW (ref 135–145)
Total Bilirubin: 1.2 mg/dL (ref 0.3–1.2)
Total Protein: 5.4 g/dL — ABNORMAL LOW (ref 6.5–8.1)

## 2019-02-19 LAB — GLUCOSE, CAPILLARY
Glucose-Capillary: 111 mg/dL — ABNORMAL HIGH (ref 70–99)
Glucose-Capillary: 61 mg/dL — ABNORMAL LOW (ref 70–99)
Glucose-Capillary: 124 mg/dL — ABNORMAL HIGH (ref 70–99)
Glucose-Capillary: 259 mg/dL — ABNORMAL HIGH (ref 70–99)
Glucose-Capillary: 429 mg/dL — ABNORMAL HIGH (ref 70–99)
Glucose-Capillary: 417 mg/dL — ABNORMAL HIGH (ref 70–99)

## 2019-02-19 LAB — CBC WITH DIFFERENTIAL/PLATELET
Abs Immature Granulocytes: 0.15 10*3/uL — ABNORMAL HIGH (ref 0.00–0.07)
Basophils Absolute: 0 10*3/uL (ref 0.0–0.1)
Basophils Relative: 0 %
Eosinophils Absolute: 0 10*3/uL (ref 0.0–0.5)
Eosinophils Relative: 1 %
HCT: 29.5 % — ABNORMAL LOW (ref 39.0–52.0)
Hemoglobin: 9.6 g/dL — ABNORMAL LOW (ref 13.0–17.0)
Immature Granulocytes: 2 %
Lymphocytes Relative: 16 %
Lymphs Abs: 1.4 10*3/uL (ref 0.7–4.0)
MCH: 28.9 pg (ref 26.0–34.0)
MCHC: 32.5 g/dL (ref 30.0–36.0)
MCV: 88.9 fL (ref 80.0–100.0)
Monocytes Absolute: 0.3 10*3/uL (ref 0.1–1.0)
Monocytes Relative: 4 %
Neutro Abs: 6.9 10*3/uL (ref 1.7–7.7)
Neutrophils Relative %: 77 %
Platelets: 168 10*3/uL (ref 150–400)
RBC: 3.32 MIL/uL — ABNORMAL LOW (ref 4.22–5.81)
RDW: 14.5 % (ref 11.5–15.5)
WBC: 8.8 10*3/uL (ref 4.0–10.5)
nRBC: 0 % (ref 0.0–0.2)

## 2019-02-19 LAB — C-REACTIVE PROTEIN: CRP: 7.7 mg/dL — ABNORMAL HIGH (ref <1.0)

## 2019-02-19 LAB — LACTIC ACID, PLASMA: Lactic Acid, Venous: 2.1 mmol/L (ref 0.5–1.9)

## 2019-02-19 LAB — D-DIMER, QUANTITATIVE: D-Dimer, Quant: 2.63 ug/mL-FEU — ABNORMAL HIGH (ref 0.00–0.50)

## 2019-02-19 LAB — MAGNESIUM: Magnesium: 2 mg/dL (ref 1.7–2.4)

## 2019-02-19 LAB — BRAIN NATRIURETIC PEPTIDE: B Natriuretic Peptide: 238.1 pg/mL — ABNORMAL HIGH (ref 0.0–100.0)

## 2019-02-19 MED ORDER — DEXAMETHASONE 4 MG PO TABS: 2.0000 mg | ORAL_TABLET | Freq: Every day | ORAL | Status: DC

## 2019-02-19 MED ORDER — AMOXICILLIN-POT CLAVULANATE 875-125 MG PO TABS
1.0000 | ORAL_TABLET | Freq: Two times a day (BID) | ORAL | Status: DC
  Administered 2019-02-20: 1 via ORAL
  Filled 2019-02-19 (×3): qty 1

## 2019-02-19 MED ORDER — INSULIN GLARGINE 100 UNIT/ML ~~LOC~~ SOLN
15.0000 [IU] | Freq: Two times a day (BID) | SUBCUTANEOUS | Status: DC
  Administered 2019-02-20: 15 [IU] via SUBCUTANEOUS
  Filled 2019-02-19 (×2): qty 0.15

## 2019-02-19 MED ORDER — SODIUM CHLORIDE 0.9 % IV SOLN
2.0000 g | INTRAVENOUS | Status: AC
  Administered 2019-02-19: 2 g via INTRAVENOUS
  Filled 2019-02-19: qty 20

## 2019-02-19 MED ORDER — INSULIN GLARGINE 100 UNIT/ML ~~LOC~~ SOLN
30.0000 [IU] | Freq: Two times a day (BID) | SUBCUTANEOUS | Status: DC
  Filled 2019-02-19: qty 0.3

## 2019-02-19 MED ORDER — INSULIN ASPART 100 UNIT/ML ~~LOC~~ SOLN
15.0000 [IU] | Freq: Once | SUBCUTANEOUS | Status: AC
  Administered 2019-02-19: 15 [IU] via SUBCUTANEOUS

## 2019-02-19 NOTE — Progress Notes (Signed)
Brief Pharmacy Note  Patient is an 84 y/o M who presented to [facility] ED on 10/31 with AMS and fever. Recent admission on 10/7 for hypoxic respiratory failure 2/2 COVID-19. He was subsequently transferred to [facility] where he is currently admitted. Ucx from ED visit have resulted >100k colonies/mL K pneumoniae. Bcx from ED visit have resulted 4/4 bottles K pneumoniae. At [facility] he continues on anti-infective therapy for K pneumoniae UTI and bacteremia with ceftriaxone. Susceptibilities indicate sensitivity of identified strain to ceftriaxone. Considering patient is adequately covered and treatment team is aware of culture results per chart will not pursue further action.   [place] Resident 19 February 2019

## 2019-02-19 NOTE — Progress Notes (Signed)
CRITICAL VALUE ALERT  Critical Value:  Lactic Acid 2.1  Date & Time Notied:  02/19/2019 8 am  Provider Notified: Dr. Venetia Constable   Orders Received/Actions taken: No further orders   Will continue to monitor

## 2019-02-19 NOTE — Progress Notes (Signed)
Blood sugar this morning was 61.  See mar for treatment.  Will recheck CBG 15 minutes after treatment.

## 2019-02-19 NOTE — Progress Notes (Signed)
  Speech Language Pathology Treatment: Dysphagia  Patient Details Name: Jesus [name] MRN: [id number] DOB: 1934/05/10 Today's Date: 02/19/2019 Time: 5329-9242 SLP Time Calculation (min) (ACUTE ONLY): 12 min  Assessment / Plan / Recommendation Clinical Impression  Attempted to see pt in between meals to try water per the water protocol, but pt was eating more of his breakfast tray upon SLP arrival. Consideration was given to providing advanced trials of thin liquids during meals, but pt had intermittent coughing with current diet textures, so advanced trials were deferred this date. Pt's coughing was associated with large bolus sizes of solids, with impulsive rate of adding more food into his mouth before he cleared what was already there. SLP provided Mod cues and demonstrated more appropriate bolus size with coughing then reduced. Overall his food is more appropriately cut into bite-sized pieces today (on Dys 3 instead of soft) and when he takes smaller bites he also has less oral residue. Current recommendations and POC remain appropriate.    HPI HPI: Pt is an 84 yo male who was recently treated for COVID-19 (first day of dx 10/7), but returns to the hospital for increased SOB, AMS, and fevers. Pt found to have UTI. During recent admission pt was evaluated by SLP given reports of desaturations after PO intake. MBS on 10/19 revealed trace aspiration with thin liquids that elicited a cough. There was also concern for esophageal backflow. Dys 2 diet and nectar thick liquids recommended as well as water protocol in between meals. PMH includes: dementia, DM, HTN, BBB, anemia, CAD, GERD      SLP Plan  Continue with current plan of care       Recommendations  Diet recommendations: Dysphagia 3 (mechanical soft);Nectar-thick liquid(small sips of water btwn meals after oral care) Liquids provided via: Cup;Straw Medication Administration: Whole meds with puree Supervision: Patient able to self  feed;Staff to assist with self feeding Compensations: Slow rate;Small sips/bites Postural Changes and/or Swallow Maneuvers: Seated upright 90 degrees;Upright 30-60 min after meal                Oral Care Recommendations: Oral care BID Follow up Recommendations: Skilled Nursing facility SLP Visit Diagnosis: Dysphagia, oropharyngeal phase (R13.12) Plan: Continue with current plan of care       GO                [name] [name] 02/19/2019, 12:01 PM  [name], M.A. [place] Acute Environmental education officer [phone] Office [phone]

## 2019-02-19 NOTE — Progress Notes (Signed)
[facility] PROGRESS NOTE    Progress Note  Jesus [name]  UXN:[id number] DOB: [birth year] DOA: 02/16/2019 PCP: Barbette Reichmann, MD     Brief Narrative:   Jesus [name] is an 84 y.o. male past medical history of dementia, type 2 diabetes mellitus hypertension bundle branch block and anemia of chronic disease, CAD on dual platelet antitherapy recently treated for COVID-19 infection with SARS-CoV-2 PCR positive on 01/22/2019, was admitted to [facility] for COVID-19 pneumonia and discharged in stable condition brought back from skilled nursing facility for fever, shortness of breath and confusion.  Cale she was diagnosed with Klebsiella UTI cholecystitis was ruled out and was sent here for further treatment.  Assessment/Plan:   Klebsiella bacteremia/sepsis (HCC) Sepsis physiology seems to be improved. He was treated with IV fluids and IV empiric antibiotics. Suspicion of cholecystitis but was ruled out with a negative HIDA scan.  Abdominal exam is benign. Is currently on IV Rocephin his blood cultures were positive for Klebsiella he will need a total of 14 days of IV empiric antibiotics.  Acute encephalopathy superimposed on dementia: Likely due to infectious etiology. Now resolved. He is at high risk of acute confusional state it and/or aspiration pneumonia. Avoid narcotics and benzodiazepine. Continue Namenda.  COVID-19 viral pneumonitis: Appropriately treated with 2 weeks of antibiotics.  His CRP is elevated likely due to sepsis.  Acute kidney injury: Likely prerenal azotemia resolved with IV fluid hydration.  Left arm skin allergies with multiple whelps and excoriation: These were present on admission, wound consult was consulted looks like allergic reaction. Continue local wound care.  Coronary artery disease: Continue dual platelet antitherapy.  GERD: Continue Pepcid.  Essential hypertension: Continue Norvasc.  Diabetes mellitus type 2: Continue to hold oral  hypoglycemic agents. Currently on long-acting insulin plus sliding scale fairly controlled, this morning his blood glucose was 60.    DVT prophylaxis: lovenox Family Communication:none Disposition Plan/Barrier to D/C: home in 1 -2 days Code Status:     Code Status Orders  (From admission, onward)         Start     Ordered   02/16/19 1804  Full code  Continuous     02/16/19 1804        Code Status History    Date Active Date Inactive Code Status Order ID Comments User Context   02/16/2019 0457 02/16/2019 1741 Full Code [id number]  Frankey Shown, DO ED   01/31/2019 1153 02/05/2019 1944 Partial Code [id number]  Osvaldo Shipper, MD Inpatient   01/22/2019 2114 01/31/2019 1153 Full Code [id number]  Haydee Monica, MD Inpatient   10/22/2017 2334 10/29/2017 1556 Full Code [id number]  Cammy Copa, MD Inpatient   Advance Care Planning Activity        IV Access:    Peripheral IV   Procedures and diagnostic studies:   No results found.   Medical Consultants:    None.  Anti-Infectives:   IV Rocephin  Subjective:    Jesus [name] no complaints today.  Objective:    Vitals:   02/18/19 0710 02/18/19 1628 02/18/19 2010 02/19/19 0351  BP: (!) 158/83 132/75 (!) 130/56 105/62  Pulse: 68 90 79 65  Resp: Temp: 97.8 F (36.6 C) 98.1 F (36.7 C) 97.7 F (36.5 C) 97.6 F (36.4 C)  TempSrc: Oral Oral Oral Oral  SpO2: 97% 99% 97% 100%  Weight:      Height:       SpO2: 100 % O2 Flow Rate (L/min): 1.5 L/min  Intake/Output Summary (Last 24 hours) at 02/19/2019 0716 Last data filed at 02/19/2019 0600 Gross per 24 hour  Intake 680 ml  Output 1600 ml  Net -920 ml   Filed Weights   02/16/19 1917  Weight: 67.2 kg    Exam: General exam: In no acute distress. Respiratory system: Good air movement and clear to auscultation. Cardiovascular system: S1 & S2 heard, RRR. No JVD. Gastrointestinal system: Abdomen is nondistended, soft and nontender.  Central  nervous system: Alert and oriented. No focal neurological deficits. Extremities: No pedal edema. Skin: No rashes, lesions or ulcers  Data Reviewed:    Labs: Basic Metabolic Panel: Recent Labs  Lab 02/15/19 2038 02/16/19 0543 02/17/19 0558 02/18/19 0608 02/19/19 0420  NA 132* 131* 136 134* 133*  K 3.8 4.9 3.8 3.8 3.6  CL 92* 99 103 103 98  CO2 21* 22 23 24 25   GLUCOSE 301* 338* 57* 160* 60*  BUN 25* 21 21 17 17   CREATININE 1.34* 1.28* 1.17 0.95 0.89  CALCIUM 8.2* 7.0* 7.6* 7.6* 7.8*  MG  --   --  2.0 1.9 2.0   GFR Estimated Creatinine Clearance: 59.8 mL/min (by C-G formula based on SCr of 0.89 mg/dL). Liver Function Tests: Recent Labs  Lab 02/15/19 2038 02/16/19 0543 02/17/19 0558 02/18/19 0608 02/19/19 0420  AST 30 29 27 15  14*  ALT 31 24 24 20 19   ALKPHOS 124 94 93 88 85  BILITOT 1.9* 1.3* 0.8 0.5 1.2  PROT 6.3* 4.9* 5.4* 5.0* 5.4*  ALBUMIN 3.0* 2.1* 2.3* 2.0* 2.4*   No results for input(s): LIPASE, AMYLASE in the last 168 hours. No results for input(s): AMMONIA in the last 168 hours. Coagulation profile Recent Labs  Lab 02/15/19 2038  INR 1.1   COVID-19 Labs  Recent Labs    02/17/19 0558 02/17/19 0658 02/18/19 0608 02/19/19 0420  DDIMER  --  6.71* 3.71* 2.63*  CRP 23.8*  --  12.5* 7.7*    Lab Results  Component Value Date   SARSCOV2NAA POSITIVE (A) 02/15/2019    CBC: Recent Labs  Lab 02/15/19 2038 02/16/19 0543 02/17/19 0558 02/18/19 0608 02/19/19 0420  WBC 9.8 20.0* 16.1* 13.8* 8.8  NEUTROABS 9.6*  --  14.1* 12.2* 6.9  HGB 11.4* 9.5* 9.8* 8.9* 9.6*  HCT 34.4* 27.7* 29.6* 26.5* 29.5*  MCV 89.6 87.7 90.8 89.8 88.9  PLT 151 123* 122* 146* 168   Cardiac Enzymes: No results for input(s): CKTOTAL, CKMB, CKMBINDEX, TROPONINI in the last 168 hours. BNP (last 3 results) No results for input(s): PROBNP in the last 8760 hours. CBG: Recent Labs  Lab 02/18/19 1136 02/18/19 1628 02/18/19 2015 02/18/19 2328 02/19/19 0615  GLUCAP 272*  406* 277* 126* 111*   D-Dimer: Recent Labs    02/18/19 0608 02/19/19 0420  DDIMER 3.71* 2.63*   Hgb A1c: Recent Labs    02/17/19 0558  HGBA1C 7.9*   Lipid Profile: No results for input(s): CHOL, HDL, LDLCALC, TRIG, CHOLHDL, LDLDIRECT in the last 72 hours. Thyroid function studies: No results for input(s): TSH, T4TOTAL, T3FREE, THYROIDAB in the last 72 hours.  Invalid input(s): FREET3 Anemia work up: No results for input(s): VITAMINB12, FOLATE, FERRITIN, TIBC, IRON, RETICCTPCT in the last 72 hours. Sepsis Labs: Recent Labs  Lab 02/15/19 2038 02/15/19 2217 02/16/19 0543 02/16/19 0906 02/17/19 0558 02/18/19 0608 02/19/19 0420  PROCALCITON 4.32  --  64.22  --  43.62 18.48  --   WBC 9.8  --  20.0*  --  16.1* 13.8*  8.8  LATICACIDVEN 6.8* 4.1*  --  2.0* 2.5* 1.9  --    Microbiology Recent Results (from the past 240 hour(s))  Urine culture     Status: Abnormal   Collection Time: 02/15/19  8:38 PM   Specimen: Urine, Random  Result Value Ref Range Status   Specimen Description   Final    URINE, RANDOM Performed at [facility], [address] [place], [place] [address]    Special Requests   Final    NONE Performed at [facility], [address] [place], [place] [address]    Culture >=100,000 COLONIES/mL KLEBSIELLA PNEUMONIAE (A)  Final   Report Status 02/18/2019 FINAL  Final   Organism ID, Bacteria KLEBSIELLA PNEUMONIAE (A)  Final      Susceptibility   Klebsiella pneumoniae - MIC*    AMPICILLIN RESISTANT Resistant     CEFAZOLIN <=4 SENSITIVE Sensitive     CEFTRIAXONE <=1 SENSITIVE Sensitive     CIPROFLOXACIN <=0.25 SENSITIVE Sensitive     GENTAMICIN <=1 SENSITIVE Sensitive     IMIPENEM <=0.25 SENSITIVE Sensitive     NITROFURANTOIN 64 INTERMEDIATE Intermediate     TRIMETH/SULFA <=20 SENSITIVE Sensitive     AMPICILLIN/SULBACTAM <=2 SENSITIVE Sensitive     PIP/TAZO <=4 SENSITIVE Sensitive     Extended ESBL NEGATIVE Sensitive     * >=100,000  COLONIES/mL KLEBSIELLA PNEUMONIAE  Blood culture (routine x 2)     Status: Abnormal   Collection Time: 02/15/19  8:38 PM   Specimen: BLOOD  Result Value Ref Range Status   Specimen Description   Final    BLOOD RIGHT FOREARM Performed at [facility], [address] [place], [place] [address]    Special Requests   Final    BOTTLES DRAWN AEROBIC AND ANAEROBIC Blood Culture adequate volume Performed at [facility], [address] [place], [place] [address]    Culture  Setup Time   Final    GRAM NEGATIVE RODS IN BOTH AEROBIC AND ANAEROBIC BOTTLES CRITICAL RESULT CALLED TO, READ BACK BY AND VERIFIED WITH: [facility] GRUBB AT [id number] 02/16/2019 SDR Performed at [facility], [address] [place], [place] [address]    Culture KLEBSIELLA PNEUMONIAE (A)  Final   Report Status 02/18/2019 FINAL  Final   Organism ID, Bacteria KLEBSIELLA PNEUMONIAE  Final      Susceptibility   Klebsiella pneumoniae - MIC*    AMPICILLIN RESISTANT Resistant     CEFAZOLIN <=4 SENSITIVE Sensitive     CEFEPIME <=1 SENSITIVE Sensitive     CEFTAZIDIME <=1 SENSITIVE Sensitive     CEFTRIAXONE <=1 SENSITIVE Sensitive     CIPROFLOXACIN <=0.25 SENSITIVE Sensitive     GENTAMICIN <=1 SENSITIVE Sensitive     IMIPENEM 0.5 SENSITIVE Sensitive     TRIMETH/SULFA <=20 SENSITIVE Sensitive     AMPICILLIN/SULBACTAM <=2 SENSITIVE Sensitive     PIP/TAZO <=4 SENSITIVE Sensitive     * KLEBSIELLA PNEUMONIAE  Blood culture (routine x 2)     Status: Abnormal   Collection Time: 02/15/19  8:38 PM   Specimen: BLOOD  Result Value Ref Range Status   Specimen Description   Final    BLOOD RIGHT HAND Performed at [facility], [address] [place], [place] [address]    Special Requests   Final    BOTTLES DRAWN AEROBIC AND ANAEROBIC Blood Culture adequate volume Performed at [facility], [address] [place], [place] [address]    Culture  Setup Time  Final    GRAM NEGATIVE RODS IN  BOTH AEROBIC AND ANAEROBIC BOTTLES CRITICAL VALUE NOTED.  VALUE IS CONSISTENT WITH PREVIOUSLY REPORTED AND CALLED VALUE. Performed at [facility], [address] [place], [place] [address]    Culture (A)  Final    KLEBSIELLA PNEUMONIAE SUSCEPTIBILITIES PERFORMED ON PREVIOUS CULTURE WITHIN THE LAST 5 DAYS. Performed at [facility], [address] [place], [place] [address]    Report Status 02/18/2019 FINAL  Final  SARS Coronavirus 2 by RT PCR (hospital order, performed in [facility] hospital lab) Nasopharyngeal Urine, Clean Catch     Status: Abnormal   Collection Time: 02/15/19  8:38 PM   Specimen: Urine, Clean Catch; Nasopharyngeal  Result Value Ref Range Status   SARS Coronavirus 2 POSITIVE (A) NEGATIVE Final    Comment: RESULT CALLED TO, READ BACK BY AND VERIFIED WITH: SUSAN NEAL 02/15/2019 AT 2205 BY HS (NOTE) If result is NEGATIVE SARS-CoV-2 target nucleic acids are NOT DETECTED. The SARS-CoV-2 RNA is generally detectable in upper and lower  respiratory specimens during the acute phase of infection. The lowest  concentration of SARS-CoV-2 viral copies this assay can detect is 250  copies / mL. A negative result does not preclude SARS-CoV-2 infection  and should not be used as the sole basis for treatment or other  patient management decisions.  A negative result may occur with  improper specimen collection / handling, submission of specimen other  than nasopharyngeal swab, presence of viral mutation(s) within the  areas targeted by this assay, and inadequate number of viral copies  (<250 copies / mL). A negative result must be combined with clinical  observations, patient history, and epidemiological information. If result is POSITIVE SARS-CoV-2 target nucleic acids are DETECTED. T he SARS-CoV-2 RNA is generally detectable in upper and lower  respiratory specimens during the acute phase of infection.  Positive  results are indicative of active infection  with SARS-CoV-2.  Clinical  correlation with patient history and other diagnostic information is  necessary to determine patient infection status.  Positive results do  not rule out bacterial infection or co-infection with other viruses. If result is PRESUMPTIVE POSTIVE SARS-CoV-2 nucleic acids MAY BE PRESENT.   A presumptive positive result was obtained on the submitted specimen  and confirmed on repeat testing.  While 2019 novel coronavirus  (SARS-CoV-2) nucleic acids may be present in the submitted sample  additional confirmatory testing may be necessary for epidemiological  and / or clinical management purposes  to differentiate between  SARS-CoV-2 and other Sarbecovirus currently known to infect humans.  If clinically indicated additional testing with an alternate test  methodology [phone]) is  advised. The SARS-CoV-2 RNA is generally  detectable in upper and lower respiratory specimens during the acute  phase of infection. The expected result is Negative. Fact Sheet for Patients:  BoilerBrush.com.cy Fact Sheet for Healthcare Providers: https://pope.com/ This test is not yet approved or cleared by the [place] FDA and has been authorized for detection and/or diagnosis of SARS-CoV-2 by FDA under an Emergency Use Authorization (EUA).  This EUA will remain in effect (meaning this test can be used) for the duration of the COVID-19 declaration under Section 564(b)(1) of the Act, 21 U.S.C. section 360bbb-3(b)(1), unless the authorization is terminated or revoked sooner. Performed at [facility], [address] [place], [place] [address]   Blood Culture ID Panel (Reflexed)     Status: Abnormal   Collection Time: 02/15/19  8:38 PM  Result  Value Ref Range Status   Enterococcus species NOT DETECTED NOT DETECTED Final   Listeria monocytogenes NOT DETECTED NOT DETECTED Final   Staphylococcus species NOT DETECTED NOT  DETECTED Final   Staphylococcus aureus (BCID) NOT DETECTED NOT DETECTED Final   Streptococcus species NOT DETECTED NOT DETECTED Final   Streptococcus agalactiae NOT DETECTED NOT DETECTED Final   Streptococcus pneumoniae NOT DETECTED NOT DETECTED Final   Streptococcus pyogenes NOT DETECTED NOT DETECTED Final   Acinetobacter baumannii NOT DETECTED NOT DETECTED Final   Enterobacteriaceae species DETECTED (A) NOT DETECTED Final    Comment: Enterobacteriaceae represent a large family of gram-negative bacteria, not a single organism. CRITICAL RESULT CALLED TO, READ BACK BY AND VERIFIED WITH: RODNEY GRUBB AT 7829 02/16/2019 SDR    Enterobacter cloacae complex NOT DETECTED NOT DETECTED Final   Escherichia coli NOT DETECTED NOT DETECTED Final   Klebsiella oxytoca NOT DETECTED NOT DETECTED Final   Klebsiella pneumoniae DETECTED (A) NOT DETECTED Final    Comment: CRITICAL RESULT CALLED TO, READ BACK BY AND VERIFIED WITH:  RIODNEY GRUBB AT 0918 02/16/2019 SDR    Proteus species NOT DETECTED NOT DETECTED Final   Serratia marcescens NOT DETECTED NOT DETECTED Final   Carbapenem resistance NOT DETECTED NOT DETECTED Final   Haemophilus influenzae NOT DETECTED NOT DETECTED Final   Neisseria meningitidis NOT DETECTED NOT DETECTED Final   Pseudomonas aeruginosa NOT DETECTED NOT DETECTED Final   Candida albicans NOT DETECTED NOT DETECTED Final   Candida glabrata NOT DETECTED NOT DETECTED Final   Candida krusei NOT DETECTED NOT DETECTED Final   Candida parapsilosis NOT DETECTED NOT DETECTED Final   Candida tropicalis NOT DETECTED NOT DETECTED Final    Comment: Performed at [facility], [address] [place], [place] [address]     Medications:   . amLODipine  10 mg Oral Daily  . aspirin  81 mg Oral Daily  . clopidogrel  75 mg Oral Daily  . dexamethasone (DECADRON) injection  4 mg Intravenous Q24H  . donepezil  20 mg Oral QHS  . enoxaparin (LOVENOX) injection  40 mg Subcutaneous Q24H  .  famotidine  20 mg Oral Daily  . insulin aspart  0-5 Units Subcutaneous QHS  . insulin aspart  0-9 Units Subcutaneous TID WC  . insulin glargine  10 Units Subcutaneous QHS  . tamsulosin  0.8 mg Oral QHS   Continuous Infusions: . cefTRIAXone (ROCEPHIN)  IV 2 g (02/18/19 2104)       LOS: 3 days   Marinda Elk  [facility]  02/19/2019, 7:16 AM

## 2019-02-20 DIAGNOSIS — R7881 Bacteremia: Secondary | ICD-10-CM

## 2019-02-20 DIAGNOSIS — B961 Klebsiella pneumoniae [K. pneumoniae] as the cause of diseases classified elsewhere: Secondary | ICD-10-CM

## 2019-02-20 DIAGNOSIS — E1169 Type 2 diabetes mellitus with other specified complication: Secondary | ICD-10-CM

## 2019-02-20 LAB — C-REACTIVE PROTEIN: CRP: 4.6 mg/dL — ABNORMAL HIGH (ref <1.0)

## 2019-02-20 LAB — COMPREHENSIVE METABOLIC PANEL
ALT: 20 U/L (ref 0–44)
AST: 15 U/L (ref 15–41)
Albumin: 2.3 g/dL — ABNORMAL LOW (ref 3.5–5.0)
Alkaline Phosphatase: 92 U/L (ref 38–126)
Anion gap: 13 (ref 5–15)
BUN: 21 mg/dL (ref 8–23)
CO2: 22 mmol/L (ref 22–32)
Calcium: 7.9 mg/dL — ABNORMAL LOW (ref 8.9–10.3)
Chloride: 96 mmol/L — ABNORMAL LOW (ref 98–111)
Creatinine, Ser: 0.98 mg/dL (ref 0.61–1.24)
GFR calc Af Amer: >60 mL/min (ref >60)
GFR calc non Af Amer: >60 mL/min (ref >60)
Glucose, Bld: 383 mg/dL — ABNORMAL HIGH (ref 70–99)
Potassium: 3.8 mmol/L (ref 3.5–5.1)
Sodium: 131 mmol/L — ABNORMAL LOW (ref 135–145)
Total Bilirubin: 0.7 mg/dL (ref 0.3–1.2)
Total Protein: 4.9 g/dL — ABNORMAL LOW (ref 6.5–8.1)

## 2019-02-20 LAB — CBC WITH DIFFERENTIAL/PLATELET
Abs Immature Granulocytes: 0.27 10*3/uL — ABNORMAL HIGH (ref 0.00–0.07)
Basophils Absolute: 0 10*3/uL (ref 0.0–0.1)
Basophils Relative: 0 %
Eosinophils Absolute: 0 10*3/uL (ref 0.0–0.5)
Eosinophils Relative: 0 %
HCT: 27.6 % — ABNORMAL LOW (ref 39.0–52.0)
Hemoglobin: 9.3 g/dL — ABNORMAL LOW (ref 13.0–17.0)
Immature Granulocytes: 4 %
Lymphocytes Relative: 14 %
Lymphs Abs: 0.9 10*3/uL (ref 0.7–4.0)
MCH: 29.8 pg (ref 26.0–34.0)
MCHC: 33.7 g/dL (ref 30.0–36.0)
MCV: 88.5 fL (ref 80.0–100.0)
Monocytes Absolute: 0.3 10*3/uL (ref 0.1–1.0)
Monocytes Relative: 5 %
Neutro Abs: 4.7 10*3/uL (ref 1.7–7.7)
Neutrophils Relative %: 77 %
Platelets: 194 10*3/uL (ref 150–400)
RBC: 3.12 MIL/uL — ABNORMAL LOW (ref 4.22–5.81)
RDW: 14.2 % (ref 11.5–15.5)
WBC: 6.2 10*3/uL (ref 4.0–10.5)
nRBC: 0 % (ref 0.0–0.2)

## 2019-02-20 LAB — MAGNESIUM: Magnesium: 1.9 mg/dL (ref 1.7–2.4)

## 2019-02-20 LAB — BRAIN NATRIURETIC PEPTIDE: B Natriuretic Peptide: 243.1 pg/mL — ABNORMAL HIGH (ref 0.0–100.0)

## 2019-02-20 LAB — GLUCOSE, CAPILLARY
Glucose-Capillary: 311 mg/dL — ABNORMAL HIGH (ref 70–99)
Glucose-Capillary: 311 mg/dL — ABNORMAL HIGH (ref 70–99)

## 2019-02-20 LAB — D-DIMER, QUANTITATIVE: D-Dimer, Quant: 2 ug/mL-FEU — ABNORMAL HIGH (ref 0.00–0.50)

## 2019-02-20 MED ORDER — SODIUM CHLORIDE 0.9 % IV BOLUS
500.0000 mL | Freq: Once | INTRAVENOUS | Status: AC
  Administered 2019-02-20: 500 mL via INTRAVENOUS

## 2019-02-20 MED ORDER — INSULIN ASPART 100 UNIT/ML ~~LOC~~ SOLN
0.0000 [IU] | Freq: Three times a day (TID) | SUBCUTANEOUS | Status: DC
  Administered 2019-02-20: 11 [IU] via SUBCUTANEOUS

## 2019-02-20 MED ORDER — INSULIN ASPART 100 UNIT/ML ~~LOC~~ SOLN
6.0000 [IU] | Freq: Three times a day (TID) | SUBCUTANEOUS | Status: DC
  Administered 2019-02-20: 6 [IU] via SUBCUTANEOUS

## 2019-02-20 MED ORDER — AMOXICILLIN-POT CLAVULANATE 875-125 MG PO TABS: 1.0000 | ORAL_TABLET | Freq: Two times a day (BID) | ORAL | 0 refills | Status: AC

## 2019-02-20 MED ORDER — INSULIN ASPART 100 UNIT/ML ~~LOC~~ SOLN: 0.0000 [IU] | Freq: Every day | SUBCUTANEOUS | Status: DC

## 2019-02-20 NOTE — TOC Transition Note (Signed)
Transition of Care [facility]) - CM/SW Discharge Note   Patient Details  Name: Jesus [name] MRN: [id number] Date of Birth: 03-13-1935  [facility] of [facility]) CM/SW Contact:  Carles Collet, RN Phone Number: 02/20/2019, 8:50 AM   Clinical Narrative:    Damaris Schooner w patient's brother over the phone. He wants patient to return home at DC. He wantsto continue with [facility] for [facility] services and has notified them of patient coming home today. CM spoke w Kerry Dory from [facility] and confirmed [place], and requested [place] orders from MD. Brother states that patient has Home O2, WC, 3/1, RW and hospital bed being delivered. Brother to provide transport home. He confirms that he has a tank for transport and will bring it to the hospital with him. He also has a pulse ox.  No other CM needs identified.     Final next level of care: [place] Barriers to Discharge: No Barriers Identified   Patient Goals and CMS Choice Patient states their goals for this hospitalization and ongoing recovery are:: to go back to brother's house CMS Medicare.gov Compare Post Acute Care list provided to:: Other (Comment Required) Choice offered to / list presented to : Sibling  Discharge Placement                       Discharge Plan and Services                DME Arranged: N/A         HH Arranged: RN, PT, OT, Nurse's Aide [place] Agency: [facility] Date [facility] Agency Contacted: 02/20/19 Time HH Agency Contacted: [phone] Representative spoke with at [place]: [place] ([place]) Interventions     Readmission Risk Interventions No flowsheet data found.

## 2019-02-20 NOTE — Progress Notes (Signed)
Patient discharged to home, AVS reviewed with [place], his brother, over the phone. AVS sent home with the patient. Brother verbalized understanding of quarantine guidelines, follow up appointments, and new medication. Family provided transportation.

## 2019-02-20 NOTE — Progress Notes (Signed)
Physical Therapy Treatment Patient Details Name: Jesus [name] MRN: [id number] DOB: 1934-10-05 Today's Date: 02/20/2019    History of Present Illness Jesus [name]  is a 84 y.o. male, recently treated COVID-19 infection first day of diagnosis was 01/22/2019, mild dementia, DM type II, hypertension, bundle branch block, anemia of chronic disease, CAD on dual antiplatelet therapy who was admitted to [facility] for COVID-19 pneumonia 2 weeks ago and discharged  to SNF, Returned to home , to ED with fevers apparently more short of breath and more confused 02/16/19   PT Comments    Pt progressing well with mobility. Able to perform multiple sit<>stand transfers and ambulate short distance with RW and min guard, requiring up to minA for balance. Pt pleasantly confused. Pt unaware of bowel incontinence, dependent for pericare and assist for lower body ADLs. Pt planning to d/c home today with 24/7 care from family. If to remain admitted, will follow acutely.   Follow Up Recommendations  Home health PT;Supervision/Assistance - 24 hour(family declined SNF)     Equipment Recommendations  Rolling walker with 5" wheels;3in1 (PT);Wheelchair (measurements PT);Wheelchair cushion (measurements PT);Hospital bed    Recommendations for Other Services       Precautions / Restrictions Precautions Precautions: Fall Precaution Comments: HOH, bowel incontinence Restrictions Weight Bearing Restrictions: No    Mobility  Bed Mobility               General bed mobility comments: Received sitting in recliner  Transfers Overall transfer level: Needs assistance Equipment used: Rolling walker (2 wheeled) Transfers: Sit to/from Stand Sit to Stand: Min guard;Min assist         General transfer comment: Pt able to stand multiple times from recliner to RW with min guard to minA for steadying assist; repeated cues every standing trial for hand placement, did well with use of  armrests  Ambulation/Gait Ambulation/Gait assistance: Min assist;Min guard Gait Distance (Feet): 12 Feet(+24) Assistive device: Rolling walker (2 wheeled) Gait Pattern/deviations: Step-through pattern;Decreased stride length;Trunk flexed Gait velocity: Decreased Gait velocity interpretation: <1.8 ft/sec, indicate of risk for recurrent falls General Gait Details: Slow gait with RW and intermittent minA for balance, initially 12' of forwards/backwards walking pt with difficulty rolling RW backwards; seated rest break, then additional 24'   Stairs             Wheelchair Mobility    Modified Rankin (Stroke Patients Only)       Balance Overall balance assessment: Needs assistance   Sitting balance-Leahy Scale: Fair       Standing balance-Leahy Scale: Poor Standing balance comment: Reliant on UE support; able to static stand with RW and min guard, dependent for posterior pericare                            Cognition Arousal/Alertness: Awake/alert Behavior During Therapy: Flat affect Overall Cognitive Status: History of cognitive impairments - at baseline Area of Impairment: Attention;Memory;Following commands;Orientation;Awareness;Problem solving                 Orientation Level: Place;Time;Situation Current Attention Level: Sustained;Selective Memory: Decreased short-term memory;Decreased recall of precautions Following Commands: Follows one step commands consistently   Awareness: Intellectual Problem Solving: Requires verbal cues General Comments: Pt pleasantly confused, states he lives with his parents; following simple, direct commands consistently; unaware of bowel incontinence      Exercises      General Comments        Pertinent Vitals/Pain Pain Assessment: No/denies  pain    Home Living                      Prior Function            PT Goals (current goals can now be found in the care plan section) Progress towards PT  goals: Progressing toward goals    Frequency    Min 3X/week      PT Plan Frequency needs to be updated    Co-evaluation              AM-PAC PT "6 Clicks" Mobility   Outcome Measure  Help needed turning from your back to your side while in a flat bed without using bedrails?: A Little Help needed moving from lying on your back to sitting on the side of a flat bed without using bedrails?: A Little Help needed moving to and from a bed to a chair (including a wheelchair)?: A Little Help needed standing up from a chair using your arms (e.g., wheelchair or bedside chair)?: A Little Help needed to walk in hospital room?: A Little Help needed climbing 3-5 steps with a railing? : A Lot 6 Click Score: 17    End of Session   Activity Tolerance: Patient tolerated treatment well Patient left: in chair;with call bell/phone within reach;with chair alarm set Nurse Communication: Mobility status PT Visit Diagnosis: Muscle weakness (generalized) (M62.81);Other abnormalities of gait and mobility (R26.89)     Time: 1250-1315 PT Time Calculation (min) (ACUTE ONLY): 25 min  Charges:  $Therapeutic Activity: 23-37 mins                    Mabeline Caras, PT, DPT Acute Rehabilitation Services  Pager [phone] Office [place] 02/20/2019, 1:27 PM

## 2019-02-20 NOTE — Discharge Summary (Signed)
Physician Discharge Summary  Jesus [name] WUJ:[id number] DOB: [birth year] DOA: 02/16/2019  PCP: Barbette Reichmann, MD  Admit date: 02/16/2019 Discharge date: 02/20/2019  Admitted From: Home Disposition:  SNF  Recommendations for Outpatient Follow-up:  1. Follow up with PCP in 1-2 weeks 2. Please obtain BMP/CBC in one week. 3. Palliative  Care to meet with family and address goals of care.  Home Health:No Equipment/Devices:None  Discharge Condition:Stable CODE STATUS:* Full Diet recommendation: Heart Healthy   Brief/Interim Summary: 84 y.o. male past medical history of dementia, type 2 diabetes mellitus hypertension bundle branch block and anemia of chronic disease, CAD on dual platelet antitherapy recently treated for COVID-19 infection with SARS-CoV-2 PCR positive on 01/22/2019, was admitted to [facility] for COVID-19 pneumonia and discharged in stable condition brought back from skilled nursing facility for fever, shortness of breath and confusion.  [place] she was diagnosed with Klebsiella UTI cholecystitis was ruled out and was sent here for further treatment.  Discharge Diagnoses:  Principal Problem:   Klebsiella sepsis (HCC) Active Problems:   Dementia without behavioral disturbance (HCC)   Encephalopathy due to COVID-19 virus   DM (diabetes mellitus) (HCC)   HTN (hypertension)   Bacteremia due to Klebsiella pneumoniae Klebsiella bacteremia due to Klebsiella UTI: He was treated empirically with IV antibiotics and was fluid resuscitated. HIDA scan was negative for cholecystitis. Blood cultures and urine cultures came back Klebsiella sensitive to Augmentin. He defervesced leukocytosis improved physical therapy evaluated the patient and recommended 24-hour supervision.  He will go home on amoxicillin twice a day for a total of 14 days.  Acute encephalopathy superimposed on dementia: Likely due to infectious etiology, he was treated conservatively now resolved.  COVID-19 viral  pneumonitis: He was appropriately treated.  Acute kidney injury: Likely prerenal azotemia resolved with IV fluid hydration.  Left arm skin allergies with multiple whelps and excoriation: Present on admission looks like an allergy continue local wound care.  Coronary artery disease: Continue dual platelet antitherapy  GERD: Continue Pepcid.  Essential hypertension: No changes made to his medication.  Diabetes mellitus type II: Oral hypoglycemics were held on admission. He was covered with long-acting insulin plus sliding scale his blood glucose remained fairly controlled. No changes made to his medication. He will resume his home regimen upon discharge.   Discharge Instructions  Discharge Instructions    Diet - low sodium heart healthy   Complete by: As directed    Increase activity slowly   Complete by: As directed      Allergies as of 02/20/2019      Reactions   Zosyn [piperacillin-tazobactam In Dex] Rash      Medication List    TAKE these medications   amLODipine 10 MG tablet Commonly known as: NORVASC Take 10 mg by mouth daily.   amoxicillin-clavulanate 875-125 MG tablet Commonly known as: AUGMENTIN Take 1 tablet by mouth every 12 (twelve) hours.   aspirin 81 MG chewable tablet Chew 81 mg by mouth daily.   clopidogrel 75 MG tablet Commonly known as: PLAVIX Take 75 mg by mouth daily.   dexamethasone 2 MG tablet Commonly known as: DECADRON Take 2 tablets once daily for 3 days, then 1 tablet once daily for 3 days, then STOP.   donepezil 10 MG tablet Commonly known as: ARICEPT Take 20 mg by mouth at bedtime.   famotidine 20 MG tablet Commonly known as: PEPCID Take 1 tablet (20 mg total) by mouth daily for 10 days.   glipiZIDE 10 MG tablet Commonly known as:  GLUCOTROL Take 20 mg by mouth 2 (two) times daily before a meal.   guaiFENesin-dextromethorphan 100-10 MG/5ML syrup Commonly known as: ROBITUSSIN DM Take 10 mLs by mouth every 4 (four) hours  as needed for cough.   omega-3 acid ethyl esters 1 g capsule Commonly known as: LOVAZA Take 2 g by mouth daily.   pioglitazone 30 MG tablet Commonly known as: ACTOS Take 30 mg by mouth daily.   PreserVision AREDS 2+Multi Vit Caps Take 1 capsule by mouth at bedtime.   Resource ThickenUp Clear Powd Use to thicken fluids   tamsulosin 0.4 MG Caps capsule Commonly known as: FLOMAX Take 0.8 mg by mouth at bedtime.       Allergies  Allergen Reactions  . Zosyn [Piperacillin-Tazobactam In Dex] Rash    Consultations:  None   Procedures/Studies: Ct Head Wo Contrast  Result Date: 02/15/2019 CLINICAL DATA:  84 year old male with altered mental status. EXAM: CT HEAD WITHOUT CONTRAST TECHNIQUE: Contiguous axial images were obtained from the base of the skull through the vertex without intravenous contrast. COMPARISON:  Head CT dated 01/13/2019 FINDINGS: Brain: Moderate age-related atrophy and chronic microvascular ischemic changes. Left basal ganglia old lacunar infarct. There is no acute intracranial hemorrhage. No mass effect or midline shift. No extra-axial fluid collection. Vascular: No hyperdense vessel or unexpected calcification. Skull: Normal. Negative for fracture or focal lesion. Sinuses/Orbits: Mild mucoperiosteal thickening of paranasal sinuses. Partially visualized left maxillary sinus retention cyst or polyp. Partial right mastoid effusion. The left mastoid air cells are clear. No air-fluid level. Other: None IMPRESSION: 1. No acute intracranial hemorrhage. 2. Moderate age-related atrophy and chronic microvascular ischemic changes. Left basal ganglia old lacunar infarct. Electronically Signed   By: Elgie Collard M.D.   On: 02/15/2019 23:13   Ct Angio Chest Pe W And/or Wo Contrast  Result Date: 02/15/2019 CLINICAL DATA:  Complex chest pain.  Abdominal pain. EXAM: CT ANGIOGRAPHY CHEST CT ABDOMEN AND PELVIS WITH CONTRAST TECHNIQUE: Multidetector CT imaging of the chest was  performed using the standard protocol during bolus administration of intravenous contrast. Multiplanar CT image reconstructions and MIPs were obtained to evaluate the vascular anatomy. Multidetector CT imaging of the abdomen and pelvis was performed using the standard protocol during bolus administration of intravenous contrast. CONTRAST:  OMNIPAQUE IOHEXOL 350 MG/ML SOLN COMPARISON:  CT dated January 28, 2019. FINDINGS: CTA CHEST FINDINGS Cardiovascular: Evaluation is limited by respiratory motion artifact. Given this limitation, no PE was identified. The heart size is enlarged. Aortic calcifications are noted. Coronary artery calcifications are noted. Mediastinum/Nodes: --No mediastinal or hilar lymphadenopathy. --No axillary lymphadenopathy. --No supraclavicular lymphadenopathy. --Normal thyroid gland. --The esophagus is unremarkable Lungs/Pleura: Again identified are scattered ground-glass airspace opacities bilaterally with some interval evolution since the prior study. The overall distribution is relatively similar to prior study. There is a new 2.5 by 1.5 cm cystic area within the left upper lobe that demonstrates an air-fluid level. There is no pneumothorax. The trachea is unremarkable. There is some atelectasis at the lung bases. There is no significant pleural effusion. Musculoskeletal: No chest wall abnormality. No acute or significant osseous findings. Review of the MIP images confirms the above findings. CT ABDOMEN and PELVIS FINDINGS Hepatobiliary: The liver is normal. The gallbladder is distended with apparent gallbladder wall thickening, however this is not well evaluated secondary to motion artifact.There is mild intrahepatic and extrahepatic biliary ductal dilatation. Pancreas: Normal contours without ductal dilatation. No peripancreatic fluid collection. Spleen: No splenic laceration or hematoma. Adrenals/Urinary Tract: --Adrenal glands: No  adrenal hemorrhage. --Right kidney/ureter: No  hydronephrosis or perinephric hematoma. --Left kidney/ureter: No hydronephrosis or perinephric hematoma. --Urinary bladder: There is urinary bladder wall thickening with mild adjacent fat stranding. Stomach/Bowel: --Stomach/Duodenum: No hiatal hernia or other gastric abnormality. Normal duodenal course and caliber. --Small bowel: No dilatation or inflammation. --Colon: No focal abnormality. --Appendix: Normal. Vascular/Lymphatic: Atherosclerotic calcification is present within the non-aneurysmal abdominal aorta, without hemodynamically significant stenosis. --No retroperitoneal lymphadenopathy. --No mesenteric lymphadenopathy. --No pelvic or inguinal lymphadenopathy. Reproductive: The prostate gland is enlarged. Other: No ascites or free air. The abdominal wall is normal. Musculoskeletal. No acute displaced fractures. Review of the MIP images confirms the above findings. IMPRESSION: 1. Motion degraded studies. 2. Given the above limitation, no pulmonary embolus was detected. 3. Persistent bilateral ground-glass airspace opacities consistent with viral pneumonia. These have slightly improved from prior studies. There is a new 2.5 cm pneumatocele in the left upper lobe as detailed above. 4. Distended gallbladder with gallbladder wall thickening. Findings are concerning for cholecystitis in the appropriate clinical setting. Follow-up with ultrasound is recommended. 5. Mild bladder wall thickening. Correlation with urinalysis is recommended to help exclude an underlying cystitis. Electronically Signed   By: Katherine Mantlehristopher  Green M.D.   On: 02/15/2019 23:18   Ct Angio Chest Pe W Or Wo Contrast  Result Date: 01/28/2019 CLINICAL DATA:  84 year old male with concern for pulmonary embolism. Positive D-dimer. COVID-19 positive. EXAM: CT ANGIOGRAPHY CHEST WITH CONTRAST TECHNIQUE: Multidetector CT imaging of the chest was performed using the standard protocol during bolus administration of intravenous contrast. Multiplanar CT  image reconstructions and MIPs were obtained to evaluate the vascular anatomy. CONTRAST:  100mL OMNIPAQUE IOHEXOL 350 MG/ML SOLN COMPARISON:  Chest radiograph dated 01/22/2019. FINDINGS: Cardiovascular: There is mild cardiomegaly. No pericardial effusion. Multi vessel coronary vascular calcification with involvement of the LAD, RCA, and left circumflex artery. Moderate atherosclerotic calcification of the thoracic aorta. No aneurysmal dilatation or dissection. There is diminutive appearance of the left vertebral artery. The origins of the remainder of the visualized great vessels of the aortic arch appear patent. Evaluation of the pulmonary arteries is somewhat limited due to respiratory motion artifact and suboptimal opacification and visualization of the peripheral branches. No pulmonary artery embolus identified. Mediastinum/Nodes: There is no hilar or mediastinal adenopathy. The esophagus is grossly unremarkable. Several small bilateral hypodense thyroid nodules noted. Ultrasound may provide better evaluation. No mediastinal fluid collection. Lungs/Pleura: Bilateral patchy airspace opacities noted. Overall interval progression of the airspace opacities compared to the radiograph of 01/22/2019 when accounting for difference in technique. There are a spectrum of findings in the lungs which can be seen with acute atypical infection (as well as other non-infectious etiologies). In particular, viral pneumonia (including COVID-19) should be considered in the appropriate clinical setting. Small bilateral pleural effusions noted. There is no pneumothorax. The central airways are patent. Upper Abdomen: No acute abnormality. Musculoskeletal: Osteopenia with degenerative changes of the spine. No acute osseous pathology. Left pectoral pacemaker device noted. Review of the MIP images confirms the above findings. IMPRESSION: 1. No CT evidence of pulmonary embolism. 2. Bilateral patchy airspace opacities as well as small  bilateral pleural effusions. Overall interval progression of the airspace opacities compared to the radiograph of 01/22/2019 when accounting for difference in technique. There are a spectrum of findings in the lungs which can be seen with acute atypical infection (as well as other non-infectious etiologies). In particular, viral pneumonia (including COVID) should be considered in the appropriate clinical setting. Aortic Atherosclerosis (ICD10-I70.0). Electronically Signed  By: Anner Crete M.D.   On: 01/28/2019 17:11   Nm Hepatobiliary Liver Func  Result Date: 02/16/2019 CLINICAL DATA:  Altered mental status. Sludge, gallbladder distention, gallbladder wall thickening seen on an ultrasound from today. Cholecystitis suspected. EXAM: NUCLEAR MEDICINE HEPATOBILIARY IMAGING TECHNIQUE: Sequential images of the abdomen were obtained out to 60 minutes following intravenous administration of radiopharmaceutical. RADIOPHARMACEUTICALS:  5.44 mCi Tc-41m  Choletec IV COMPARISON:  CT scan February 15, 2019.  Ultrasound February 16, 2019. FINDINGS: There is prompt uptake and washout from the liver. The gallbladder begins to fill at the end of the first hour of imaging. Filling significantly increases after morphine administration. There is normal excretion into the bowel. No other abnormalities. IMPRESSION: The gallbladder fills on this study excluding cystic duct obstruction. Electronically Signed   By: Dorise Bullion III M.D   On: 02/16/2019 15:23   Ct Abdomen Pelvis W Contrast  Result Date: 02/15/2019 CLINICAL DATA:  Complex chest pain.  Abdominal pain. EXAM: CT ANGIOGRAPHY CHEST CT ABDOMEN AND PELVIS WITH CONTRAST TECHNIQUE: Multidetector CT imaging of the chest was performed using the standard protocol during bolus administration of intravenous contrast. Multiplanar CT image reconstructions and MIPs were obtained to evaluate the vascular anatomy. Multidetector CT imaging of the abdomen and pelvis was performed  using the standard protocol during bolus administration of intravenous contrast. CONTRAST:  169mL OMNIPAQUE IOHEXOL 350 MG/ML SOLN COMPARISON:  CT dated January 28, 2019. FINDINGS: CTA CHEST FINDINGS Cardiovascular: Evaluation is limited by respiratory motion artifact. Given this limitation, no PE was identified. The heart size is enlarged. Aortic calcifications are noted. Coronary artery calcifications are noted. Mediastinum/Nodes: --No mediastinal or hilar lymphadenopathy. --No axillary lymphadenopathy. --No supraclavicular lymphadenopathy. --Normal thyroid gland. --The esophagus is unremarkable Lungs/Pleura: Again identified are scattered ground-glass airspace opacities bilaterally with some interval evolution since the prior study. The overall distribution is relatively similar to prior study. There is a new 2.5 by 1.5 cm cystic area within the left upper lobe that demonstrates an air-fluid level. There is no pneumothorax. The trachea is unremarkable. There is some atelectasis at the lung bases. There is no significant pleural effusion. Musculoskeletal: No chest wall abnormality. No acute or significant osseous findings. Review of the MIP images confirms the above findings. CT ABDOMEN and PELVIS FINDINGS Hepatobiliary: The liver is normal. The gallbladder is distended with apparent gallbladder wall thickening, however this is not well evaluated secondary to motion artifact.There is mild intrahepatic and extrahepatic biliary ductal dilatation. Pancreas: Normal contours without ductal dilatation. No peripancreatic fluid collection. Spleen: No splenic laceration or hematoma. Adrenals/Urinary Tract: --Adrenal glands: No adrenal hemorrhage. --Right kidney/ureter: No hydronephrosis or perinephric hematoma. --Left kidney/ureter: No hydronephrosis or perinephric hematoma. --Urinary bladder: There is urinary bladder wall thickening with mild adjacent fat stranding. Stomach/Bowel: --Stomach/Duodenum: No hiatal hernia or  other gastric abnormality. Normal duodenal course and caliber. --Small bowel: No dilatation or inflammation. --Colon: No focal abnormality. --Appendix: Normal. Vascular/Lymphatic: Atherosclerotic calcification is present within the non-aneurysmal abdominal aorta, without hemodynamically significant stenosis. --No retroperitoneal lymphadenopathy. --No mesenteric lymphadenopathy. --No pelvic or inguinal lymphadenopathy. Reproductive: The prostate gland is enlarged. Other: No ascites or free air. The abdominal wall is normal. Musculoskeletal. No acute displaced fractures. Review of the MIP images confirms the above findings. IMPRESSION: 1. Motion degraded studies. 2. Given the above limitation, no pulmonary embolus was detected. 3. Persistent bilateral ground-glass airspace opacities consistent with viral pneumonia. These have slightly improved from prior studies. There is a new 2.5 cm pneumatocele in the left upper lobe  as detailed above. 4. Distended gallbladder with gallbladder wall thickening. Findings are concerning for cholecystitis in the appropriate clinical setting. Follow-up with ultrasound is recommended. 5. Mild bladder wall thickening. Correlation with urinalysis is recommended to help exclude an underlying cystitis. Electronically Signed   By: Katherine Mantle M.D.   On: 02/15/2019 23:18   Dg Chest Portable 1 View  Result Date: 02/15/2019 CLINICAL DATA:  Altered mental status EXAM: PORTABLE CHEST 1 VIEW COMPARISON:  01/31/2019 FINDINGS: Again noted are patchy bilateral airspace opacities, not significantly changed from prior study. A dual chamber left-sided pacemaker is noted. There is no pneumothorax. No large pleural effusion. The heart size remains stable. There is no acute osseous abnormality. IMPRESSION: 1. No significant oval change. 2. Persistent multifocal airspace opacities consistent with the patient's history of viral pneumonia. Electronically Signed   By: Katherine Mantle M.D.   On:  02/15/2019 21:17   Dg Chest Port 1 View  Result Date: 01/31/2019 CLINICAL DATA:  COVID-19 pneumonia, dyspnea EXAM: PORTABLE CHEST 1 VIEW COMPARISON:  01/22/2019 chest radiograph. 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 Stable cardiomediastinal silhouette with top-normal heart size. No pneumothorax. No pleural effusion. Extensive patchy opacities throughout the peripheral lungs bilaterally, substantially worsened. IMPRESSION: Substantial worsening of extensive patchy lung opacities throughout the peripheral lungs bilaterally, compatible with COVID-19 pneumonia. Electronically Signed   By: Delbert Phenix M.D.   On: 01/31/2019 13:05   Dg Chest Port 1 View  Result Date: 01/22/2019 CLINICAL DATA:  Covid positive. EXAM: PORTABLE CHEST 1 VIEW COMPARISON:  01/13/2019 FINDINGS: 1237 hours. Low lung volumes. Hazy airspace opacity at the right base is new in the interval. No pleural effusion. The cardiopericardial silhouette is within normal limits for size. Left-sided permanent pacemaker noted. Telemetry leads overlie the chest. IMPRESSION: New hazy airspace opacity at the right base compatible with pneumonia. Electronically Signed   By: Kennith Center M.D.   On: 01/22/2019 13:52   Vas [place] Lower Extremity Venous (dvt)  Result Date: 01/29/2019  Lower Venous Study Indications: Edema. Other Indications: COVID. Anticoagulation: Enoxaparin. Performing Technologist: Leta Jungling RDCS  Examination Guidelines: A complete evaluation includes B-mode imaging, spectral Doppler, color Doppler, and power Doppler as needed of all accessible portions of each vessel. Bilateral testing is considered an integral part of a complete examination. Limited examinations for reoccurring indications may be performed as noted.  +---------+---------------+---------+-----------+----------+--------------+ RIGHT    CompressibilityPhasicitySpontaneityPropertiesThrombus Aging  +---------+---------------+---------+-----------+----------+--------------+ CFV      Full           Yes      Yes                                 +---------+---------------+---------+-----------+----------+--------------+ FV Prox  Full                                                        +---------+---------------+---------+-----------+----------+--------------+ FV Mid   Full                                                        +---------+---------------+---------+-----------+----------+--------------+ FV DistalFull                                                        +---------+---------------+---------+-----------+----------+--------------+  POP                                                   Not visualized +---------+---------------+---------+-----------+----------+--------------+ PTV                                                   Not visualized +---------+---------------+---------+-----------+----------+--------------+ PERO                                                  Not visualized +---------+---------------+---------+-----------+----------+--------------+   +---------+---------------+---------+-----------+----------+-------------------+ LEFT     CompressibilityPhasicitySpontaneityPropertiesThrombus Aging      +---------+---------------+---------+-----------+----------+-------------------+ CFV                     Yes      Yes                  unable to fully                                                           compress. Patient                                                         flow visualized                                                           with color and                                                            spectral doppler    +---------+---------------+---------+-----------+----------+-------------------+ FV Prox  Full                                                              +---------+---------------+---------+-----------+----------+-------------------+ FV Mid   Full                                                             +---------+---------------+---------+-----------+----------+-------------------+  FV DistalFull                                                             +---------+---------------+---------+-----------+----------+-------------------+ POP      Full           Yes      Yes                                      +---------+---------------+---------+-----------+----------+-------------------+ PTV                                                   Not visualized      +---------+---------------+---------+-----------+----------+-------------------+ PERO                                                  Not visualized      +---------+---------------+---------+-----------+----------+-------------------+     Summary: Right: There is no evidence of deep vein thrombosis in the lower extremity. However, portions of this examination were limited- see technologist comments above. Very difficult study du to patients altered mental status. Unable to visualize Pop V, PTV, and Peroneal V. Left: There is no evidence of deep vein thrombosis in the lower extremity. However, portions of this examination were limited- see technologist comments above. No cystic structure found in the popliteal fossa. Unable to visualize the PTV and Pero V.  *See table(s) above for measurements and observations. Electronically signed by Gretta Began MD on 01/29/2019 at 3:06:59 PM.    Final    US Abdomen Limited Ruq  Result Date: 02/16/2019 CLINICAL DATA:  Abdominal pain with distended gallbladder seen on recent CT. EXAM: ULTRASOUND ABDOMEN LIMITED RIGHT UPPER QUADRANT COMPARISON:  02/15/2019 CT. FINDINGS: Gallbladder: There is gallbladder sludge. The gallbladder wall is thickened measuring approximately 4 mm in thickness. The sonographic Eulah Pont sign is reported as  negative. The gallbladder is distended. Common bile duct: Diameter: 5 mm Liver: No focal lesion identified. Within normal limits in parenchymal echogenicity. Portal vein is patent on color Doppler imaging with normal direction of blood flow towards the liver. Other: None. IMPRESSION: Gallbladder sludge with gallbladder wall thickening. In the absence of a positive sonographic Murphy sign, these findings are equivocal for acute cholecystitis. If there is high clinical suspicion for acute cholecystitis, follow-up with HIDA scan is recommended. The gallbladder is distended. Electronically Signed   By: Katherine Mantle M.D.   On: 02/16/2019 00:31    Subjective: No new complaints feels great.  Discharge Exam: Vitals:   02/20/19 0400 02/20/19 0721  BP: 140/68   Pulse: 73   Resp:    Temp: 98 F (36.7 C)   SpO2: 97% 98%   Vitals:   02/19/19 2000 02/20/19 0000 02/20/19 0400 02/20/19 0721  BP: 127/61 (!) 132/58 140/68   Pulse: 69 60 73   Resp:      Temp:   98 F (36.7 C)   TempSrc:   Oral   SpO2: 98%  98% 97% 98%  Weight:      Height:        General: Pt is alert, awake, not in acute distress Cardiovascular: RRR, S1/S2 +, no rubs, no gallops Respiratory: CTA bilaterally, no wheezing, no rhonchi Abdominal: Soft, NT, ND, bowel sounds + Extremities: no edema, no cyanosis    The results of significant diagnostics from this hospitalization (including imaging, microbiology, ancillary and laboratory) are listed below for reference.     Microbiology: Recent Results (from the past 240 hour(s))  Urine culture     Status: Abnormal   Collection Time: 02/15/19  8:38 PM   Specimen: Urine, Random  Result Value Ref Range Status   Specimen Description   Final    URINE, RANDOM Performed at [facility], [address], [place], [place] [address]    Special Requests   Final    NONE Performed at [facility], [address] [place], [place] [address]    Culture >=100,000  COLONIES/mL KLEBSIELLA PNEUMONIAE (A)  Final   Report Status 02/18/2019 FINAL  Final   Organism ID, Bacteria KLEBSIELLA PNEUMONIAE (A)  Final      Susceptibility   Klebsiella pneumoniae - MIC*    AMPICILLIN RESISTANT Resistant     CEFAZOLIN <=4 SENSITIVE Sensitive     CEFTRIAXONE <=1 SENSITIVE Sensitive     CIPROFLOXACIN <=0.25 SENSITIVE Sensitive     GENTAMICIN <=1 SENSITIVE Sensitive     IMIPENEM <=0.25 SENSITIVE Sensitive     NITROFURANTOIN 64 INTERMEDIATE Intermediate     TRIMETH/SULFA <=20 SENSITIVE Sensitive     AMPICILLIN/SULBACTAM <=2 SENSITIVE Sensitive     PIP/TAZO <=4 SENSITIVE Sensitive     Extended ESBL NEGATIVE Sensitive     * >=100,000 COLONIES/mL KLEBSIELLA PNEUMONIAE  Blood culture (routine x 2)     Status: Abnormal   Collection Time: 02/15/19  8:38 PM   Specimen: BLOOD  Result Value Ref Range Status   Specimen Description   Final    BLOOD RIGHT FOREARM Performed at [facility], [address] [place], [place] [address]    Special Requests   Final    BOTTLES DRAWN AEROBIC AND ANAEROBIC Blood Culture adequate volume Performed at [facility], [address] [place], [place] [address]    Culture  Setup Time   Final    GRAM NEGATIVE RODS IN BOTH AEROBIC AND ANAEROBIC BOTTLES CRITICAL RESULT CALLED TO, READ BACK BY AND VERIFIED WITH: [facility] GRUBB AT 2595 02/16/2019 SDR Performed at [facility], [address] [place], [place] [address]    Culture KLEBSIELLA PNEUMONIAE (A)  Final   Report Status 02/18/2019 FINAL  Final   Organism ID, Bacteria KLEBSIELLA PNEUMONIAE  Final      Susceptibility   Klebsiella pneumoniae - MIC*    AMPICILLIN RESISTANT Resistant     CEFAZOLIN <=4 SENSITIVE Sensitive     CEFEPIME <=1 SENSITIVE Sensitive     CEFTAZIDIME <=1 SENSITIVE Sensitive     CEFTRIAXONE <=1 SENSITIVE Sensitive     CIPROFLOXACIN <=0.25 SENSITIVE Sensitive     GENTAMICIN <=1 SENSITIVE Sensitive     IMIPENEM 0.5 SENSITIVE Sensitive      TRIMETH/SULFA <=20 SENSITIVE Sensitive     AMPICILLIN/SULBACTAM <=2 SENSITIVE Sensitive     PIP/TAZO <=4 SENSITIVE Sensitive     * KLEBSIELLA PNEUMONIAE  Blood culture (routine x 2)     Status: Abnormal   Collection Time: 02/15/19  8:38 PM   Specimen: BLOOD  Result Value Ref Range  Status   Specimen Description   Final    BLOOD RIGHT HAND Performed at [facility], [address], [place], [place] [address]    Special Requests   Final    BOTTLES DRAWN AEROBIC AND ANAEROBIC Blood Culture adequate volume Performed at [facility], [address] [place], [place] [address]    Culture  Setup Time   Final    GRAM NEGATIVE RODS IN BOTH AEROBIC AND ANAEROBIC BOTTLES CRITICAL VALUE NOTED.  VALUE IS CONSISTENT WITH PREVIOUSLY REPORTED AND CALLED VALUE. Performed at [facility], [address] [place], [place] [address]    Culture (A)  Final    KLEBSIELLA PNEUMONIAE SUSCEPTIBILITIES PERFORMED ON PREVIOUS CULTURE WITHIN THE LAST 5 DAYS. Performed at [facility], [address] [place], [place] [address]    Report Status 02/18/2019 FINAL  Final  SARS Coronavirus 2 by RT PCR (hospital order, performed in [facility] hospital lab) Nasopharyngeal Urine, Clean Catch     Status: Abnormal   Collection Time: 02/15/19  8:38 PM   Specimen: Urine, Clean Catch; Nasopharyngeal  Result Value Ref Range Status   SARS Coronavirus 2 POSITIVE (A) NEGATIVE Final    Comment: RESULT CALLED TO, READ BACK BY AND VERIFIED WITH: SUSAN NEAL 02/15/2019 AT 2205 BY HS (NOTE) If result is NEGATIVE SARS-CoV-2 target nucleic acids are NOT DETECTED. The SARS-CoV-2 RNA is generally detectable in upper and lower  respiratory specimens during the acute phase of infection. The lowest  concentration of SARS-CoV-2 viral copies this assay can detect is 250  copies / mL. A negative result does not preclude SARS-CoV-2 infection  and should not be used as the sole basis for treatment  or other  patient management decisions.  A negative result may occur with  improper specimen collection / handling, submission of specimen other  than nasopharyngeal swab, presence of viral mutation(s) within the  areas targeted by this assay, and inadequate number of viral copies  (<250 copies / mL). A negative result must be combined with clinical  observations, patient history, and epidemiological information. If result is POSITIVE SARS-CoV-2 target nucleic acids are DETECTED. T he SARS-CoV-2 RNA is generally detectable in upper and lower  respiratory specimens during the acute phase of infection.  Positive  results are indicative of active infection with SARS-CoV-2.  Clinical  correlation with patient history and other diagnostic information is  necessary to determine patient infection status.  Positive results do  not rule out bacterial infection or co-infection with other viruses. If result is PRESUMPTIVE POSTIVE SARS-CoV-2 nucleic acids MAY BE PRESENT.   A presumptive positive result was obtained on the submitted specimen  and confirmed on repeat testing.  While 2019 novel coronavirus  (SARS-CoV-2) nucleic acids may be present in the submitted sample  additional confirmatory testing may be necessary for epidemiological  and / or clinical management purposes  to differentiate between  SARS-CoV-2 and other Sarbecovirus currently known to infect humans.  If clinically indicated additional testing with an alternate test  methodology [id number]) is  advised. The SARS-CoV-2 RNA is generally  detectable in upper and lower respiratory specimens during the acute  phase of infection. The expected result is Negative. Fact Sheet for Patients:  BoilerBrush.com.cy Fact Sheet for Healthcare Providers: https://Jesus.com/ This test is not yet approved or cleared by the [place] FDA and has been authorized for detection and/or diagnosis of  SARS-CoV-2 by FDA under an Emergency Use Authorization (EUA).  This EUA will remain in  effect (meaning this test can be used) for the duration of the COVID-19 declaration under Section 564(b)(1) of the Act, 21 U.S.C. section 360bbb-3(b)(1), unless the authorization is terminated or revoked sooner. Performed at [facility], [address] [place], [place] [address]   Blood Culture ID Panel (Reflexed)     Status: Abnormal   Collection Time: 02/15/19  8:38 PM  Result Value Ref Range Status   Enterococcus species NOT DETECTED NOT DETECTED Final   Listeria monocytogenes NOT DETECTED NOT DETECTED Final   Staphylococcus species NOT DETECTED NOT DETECTED Final   Staphylococcus aureus (BCID) NOT DETECTED NOT DETECTED Final   Streptococcus species NOT DETECTED NOT DETECTED Final   Streptococcus agalactiae NOT DETECTED NOT DETECTED Final   Streptococcus pneumoniae NOT DETECTED NOT DETECTED Final   Streptococcus pyogenes NOT DETECTED NOT DETECTED Final   Acinetobacter baumannii NOT DETECTED NOT DETECTED Final   Enterobacteriaceae species DETECTED (A) NOT DETECTED Final    Comment: Enterobacteriaceae represent a large family of gram-negative bacteria, not a single organism. CRITICAL RESULT CALLED TO, READ BACK BY AND VERIFIED WITH: RODNEY GRUBB AT 9147 02/16/2019 SDR    Enterobacter cloacae complex NOT DETECTED NOT DETECTED Final   Escherichia coli NOT DETECTED NOT DETECTED Final   Klebsiella oxytoca NOT DETECTED NOT DETECTED Final   Klebsiella pneumoniae DETECTED (A) NOT DETECTED Final    Comment: CRITICAL RESULT CALLED TO, READ BACK BY AND VERIFIED WITH:  RIODNEY GRUBB AT 0918 02/16/2019 SDR    Proteus species NOT DETECTED NOT DETECTED Final   Serratia marcescens NOT DETECTED NOT DETECTED Final   Carbapenem resistance NOT DETECTED NOT DETECTED Final   Haemophilus influenzae NOT DETECTED NOT DETECTED Final   Neisseria meningitidis NOT DETECTED NOT DETECTED Final   Pseudomonas  aeruginosa NOT DETECTED NOT DETECTED Final   Candida albicans NOT DETECTED NOT DETECTED Final   Candida glabrata NOT DETECTED NOT DETECTED Final   Candida krusei NOT DETECTED NOT DETECTED Final   Candida parapsilosis NOT DETECTED NOT DETECTED Final   Candida tropicalis NOT DETECTED NOT DETECTED Final    Comment: Performed at [facility], [address] [place], [place] [address]     Labs: BNP (last 3 results) Recent Labs    02/18/19 0608 02/19/19 0420 02/20/19 0415  BNP 624.9* 238.1* 243.1*   Basic Metabolic Panel: Recent Labs  Lab 02/16/19 0543 02/17/19 0558 02/18/19 0608 02/19/19 0420 02/20/19 0415  NA 131* 136 134* 133* 131*  K 4.9 3.8 3.8 3.6 3.8  CL 99 103 103 98 96*  CO2 GLUCOSE 338* 57* 160* 60* 383*  BUN CREATININE 1.28* 1.17 0.95 0.89 0.98  CALCIUM 7.0* 7.6* 7.6* 7.8* 7.9*  MG  --  2.0 1.9 2.0 1.9   Liver Function Tests: Recent Labs  Lab 02/16/19 0543 02/17/19 0558 02/18/19 0608 02/19/19 0420 02/20/19 0415  AST 14* 15  ALT ALKPHOS 94 93 88 85 92  BILITOT 1.3* 0.8 0.5 1.2 0.7  PROT 4.9* 5.4* 5.0* 5.4* 4.9*  ALBUMIN 2.1* 2.3* 2.0* 2.4* 2.3*   No results for input(s): LIPASE, AMYLASE in the last 168 hours. No results for input(s): AMMONIA in the last 168 hours. CBC: Recent Labs  Lab 02/15/19 2038 02/16/19 0543 02/17/19 0558 02/18/19 0608 02/19/19 0420 02/20/19 0415  WBC 9.8 20.0* 16.1* 13.8* 8.8 6.2  NEUTROABS 9.6*  --  14.1* 12.2* 6.9 4.7  HGB  11.4* 9.5* 9.8* 8.9* 9.6* 9.3*  HCT 34.4* 27.7* 29.6* 26.5* 29.5* 27.6*  MCV 89.6 87.7 90.8 89.8 88.9 88.5  PLT 151 123* 122* 146* 168 194   Cardiac Enzymes: No results for input(s): CKTOTAL, CKMB, CKMBINDEX, TROPONINI in the last 168 hours. BNP: Invalid input(s): POCBNP CBG: Recent Labs  Lab 02/19/19 0615 02/19/19 0714 02/19/19 1217 02/19/19 1617 02/19/19 2144  GLUCAP 111* 124* 259* 429* 417*   D-Dimer Recent Labs     02/19/19 0420 02/20/19 0415  DDIMER 2.63* 2.00*   Hgb A1c No results for input(s): HGBA1C in the last 72 hours. Lipid Profile No results for input(s): CHOL, HDL, LDLCALC, TRIG, CHOLHDL, LDLDIRECT in the last 72 hours. Thyroid function studies No results for input(s): TSH, T4TOTAL, T3FREE, THYROIDAB in the last 72 hours.  Invalid input(s): FREET3 Anemia work up No results for input(s): VITAMINB12, FOLATE, FERRITIN, TIBC, IRON, RETICCTPCT in the last 72 hours. Urinalysis    Component Value Date/Time   COLORURINE YELLOW (A) 02/15/2019 2038   APPEARANCEUR CLOUDY (A) 02/15/2019 2038   LABSPEC 1.011 02/15/2019 2038   PHURINE 5.0 02/15/2019 2038   GLUCOSEU >=500 (A) 02/15/2019 2038   HGBUR NEGATIVE 02/15/2019 2038   BILIRUBINUR NEGATIVE 02/15/2019 2038   KETONESUR NEGATIVE 02/15/2019 2038   PROTEINUR NEGATIVE 02/15/2019 2038   NITRITE NEGATIVE 02/15/2019 2038   LEUKOCYTESUR MODERATE (A) 02/15/2019 2038   Sepsis Labs Invalid input(s): PROCALCITONIN,  WBC,  LACTICIDVEN Microbiology Recent Results (from the past 240 hour(s))  Urine culture     Status: Abnormal   Collection Time: 02/15/19  8:38 PM   Specimen: Urine, Random  Result Value Ref Range Status   Specimen Description   Final    URINE, RANDOM Performed at [facility], [address], [place], [place] [address]    Special Requests   Final    NONE Performed at [facility], [address] [place], [place] [address]    Culture >=100,000 COLONIES/mL KLEBSIELLA PNEUMONIAE (A)  Final   Report Status 02/18/2019 FINAL  Final   Organism ID, Bacteria KLEBSIELLA PNEUMONIAE (A)  Final      Susceptibility   Klebsiella pneumoniae - MIC*    AMPICILLIN RESISTANT Resistant     CEFAZOLIN <=4 SENSITIVE Sensitive     CEFTRIAXONE <=1 SENSITIVE Sensitive     CIPROFLOXACIN <=0.25 SENSITIVE Sensitive     GENTAMICIN <=1 SENSITIVE Sensitive     IMIPENEM <=0.25 SENSITIVE Sensitive     NITROFURANTOIN 64 INTERMEDIATE  Intermediate     TRIMETH/SULFA <=20 SENSITIVE Sensitive     AMPICILLIN/SULBACTAM <=2 SENSITIVE Sensitive     PIP/TAZO <=4 SENSITIVE Sensitive     Extended ESBL NEGATIVE Sensitive     * >=100,000 COLONIES/mL KLEBSIELLA PNEUMONIAE  Blood culture (routine x 2)     Status: Abnormal   Collection Time: 02/15/19  8:38 PM   Specimen: BLOOD  Result Value Ref Range Status   Specimen Description   Final    BLOOD RIGHT FOREARM Performed at [facility], [address], [place], [place] [address]    Special Requests   Final    BOTTLES DRAWN AEROBIC AND ANAEROBIC Blood Culture adequate volume Performed at [facility], [address] [place], [place] [address]    Culture  Setup Time   Final    GRAM NEGATIVE RODS IN BOTH AEROBIC AND ANAEROBIC BOTTLES CRITICAL RESULT CALLED TO, READ BACK BY AND VERIFIED WITH: [facility] GRUBB AT 1308 02/16/2019 SDR Performed at [facility], [address] [place], [place] [address]    Culture KLEBSIELLA PNEUMONIAE (A)  Final   Report Status 02/18/2019 FINAL  Final   Organism ID, Bacteria KLEBSIELLA PNEUMONIAE  Final      Susceptibility   Klebsiella pneumoniae - MIC*    AMPICILLIN RESISTANT Resistant     CEFAZOLIN <=4 SENSITIVE Sensitive     CEFEPIME <=1 SENSITIVE Sensitive     CEFTAZIDIME <=1 SENSITIVE Sensitive     CEFTRIAXONE <=1 SENSITIVE Sensitive     CIPROFLOXACIN <=0.25 SENSITIVE Sensitive     GENTAMICIN <=1 SENSITIVE Sensitive     IMIPENEM 0.5 SENSITIVE Sensitive     TRIMETH/SULFA <=20 SENSITIVE Sensitive     AMPICILLIN/SULBACTAM <=2 SENSITIVE Sensitive     PIP/TAZO <=4 SENSITIVE Sensitive     * KLEBSIELLA PNEUMONIAE  Blood culture (routine x 2)     Status: Abnormal   Collection Time: 02/15/19  8:38 PM   Specimen: BLOOD  Result Value Ref Range Status   Specimen Description   Final    BLOOD RIGHT HAND Performed at [facility], [address], [place], [place] [address]    Special Requests   Final    BOTTLES  DRAWN AEROBIC AND ANAEROBIC Blood Culture adequate volume Performed at [facility], [address] [place], [place] [address]    Culture  Setup Time   Final    GRAM NEGATIVE RODS IN BOTH AEROBIC AND ANAEROBIC BOTTLES CRITICAL VALUE NOTED.  VALUE IS CONSISTENT WITH PREVIOUSLY REPORTED AND CALLED VALUE. Performed at [facility], [address] [place], [place] [address]    Culture (A)  Final    KLEBSIELLA PNEUMONIAE SUSCEPTIBILITIES PERFORMED ON PREVIOUS CULTURE WITHIN THE LAST 5 DAYS. Performed at [facility], [address], [place], [place] [address]    Report Status 02/18/2019 FINAL  Final  SARS Coronavirus 2 by RT PCR (hospital order, performed in [facility] hospital lab) Nasopharyngeal Urine, Clean Catch     Status: Abnormal   Collection Time: 02/15/19  8:38 PM   Specimen: Urine, Clean Catch; Nasopharyngeal  Result Value Ref Range Status   SARS Coronavirus 2 POSITIVE (A) NEGATIVE Final    Comment: RESULT CALLED TO, READ BACK BY AND VERIFIED WITH: SUSAN NEAL 02/15/2019 AT 2205 BY HS (NOTE) If result is NEGATIVE SARS-CoV-2 target nucleic acids are NOT DETECTED. The SARS-CoV-2 RNA is generally detectable in upper and lower  respiratory specimens during the acute phase of infection. The lowest  concentration of SARS-CoV-2 viral copies this assay can detect is 250  copies / mL. A negative result does not preclude SARS-CoV-2 infection  and should not be used as the sole basis for treatment or other  patient management decisions.  A negative result may occur with  improper specimen collection / handling, submission of specimen other  than nasopharyngeal swab, presence of viral mutation(s) within the  areas targeted by this assay, and inadequate number of viral copies  (<250 copies / mL). A negative result must be combined with clinical  observations, patient history, and epidemiological information. If result is POSITIVE SARS-CoV-2 target nucleic  acids are DETECTED. T he SARS-CoV-2 RNA is generally detectable in upper and lower  respiratory specimens during the acute phase of infection.  Positive  results are indicative of active infection with SARS-CoV-2.  Clinical  correlation with patient history and other diagnostic information is  necessary to determine patient infection status.  Positive results do  not rule out bacterial infection or co-infection with other  viruses. If result is PRESUMPTIVE POSTIVE SARS-CoV-2 nucleic acids MAY BE PRESENT.   A presumptive positive result was obtained on the submitted specimen  and confirmed on repeat testing.  While 2019 novel coronavirus  (SARS-CoV-2) nucleic acids may be present in the submitted sample  additional confirmatory testing may be necessary for epidemiological  and / or clinical management purposes  to differentiate between  SARS-CoV-2 and other Sarbecovirus currently known to infect humans.  If clinically indicated additional testing with an alternate test  methodology [phone]) is  advised. The SARS-CoV-2 RNA is generally  detectable in upper and lower respiratory specimens during the acute  phase of infection. The expected result is Negative. Fact Sheet for Patients:  BoilerBrush.com.cy Fact Sheet for Healthcare Providers: https://Jesus.com/ This test is not yet approved or cleared by the [place] FDA and has been authorized for detection and/or diagnosis of SARS-CoV-2 by FDA under an Emergency Use Authorization (EUA).  This EUA will remain in effect (meaning this test can be used) for the duration of the COVID-19 declaration under Section 564(b)(1) of the Act, 21 U.S.C. section 360bbb-3(b)(1), unless the authorization is terminated or revoked sooner. Performed at [facility] [place], [address] [place], [place] [address]   Blood Culture ID Panel (Reflexed)     Status: Abnormal   Collection Time: 02/15/19   8:38 PM  Result Value Ref Range Status   Enterococcus species NOT DETECTED NOT DETECTED Final   Listeria monocytogenes NOT DETECTED NOT DETECTED Final   Staphylococcus species NOT DETECTED NOT DETECTED Final   Staphylococcus aureus (BCID) NOT DETECTED NOT DETECTED Final   Streptococcus species NOT DETECTED NOT DETECTED Final   Streptococcus agalactiae NOT DETECTED NOT DETECTED Final   Streptococcus pneumoniae NOT DETECTED NOT DETECTED Final   Streptococcus pyogenes NOT DETECTED NOT DETECTED Final   Acinetobacter baumannii NOT DETECTED NOT DETECTED Final   Enterobacteriaceae species DETECTED (A) NOT DETECTED Final    Comment: Enterobacteriaceae represent a large family of gram-negative bacteria, not a single organism. CRITICAL RESULT CALLED TO, READ BACK BY AND VERIFIED WITH: RODNEY GRUBB AT 8119 02/16/2019 SDR    Enterobacter cloacae complex NOT DETECTED NOT DETECTED Final   Escherichia coli NOT DETECTED NOT DETECTED Final   Klebsiella oxytoca NOT DETECTED NOT DETECTED Final   Klebsiella pneumoniae DETECTED (A) NOT DETECTED Final    Comment: CRITICAL RESULT CALLED TO, READ BACK BY AND VERIFIED WITH:  RIODNEY GRUBB AT 0918 02/16/2019 SDR    Proteus species NOT DETECTED NOT DETECTED Final   Serratia marcescens NOT DETECTED NOT DETECTED Final   Carbapenem resistance NOT DETECTED NOT DETECTED Final   Haemophilus influenzae NOT DETECTED NOT DETECTED Final   Neisseria meningitidis NOT DETECTED NOT DETECTED Final   Pseudomonas aeruginosa NOT DETECTED NOT DETECTED Final   Candida albicans NOT DETECTED NOT DETECTED Final   Candida glabrata NOT DETECTED NOT DETECTED Final   Candida krusei NOT DETECTED NOT DETECTED Final   Candida parapsilosis NOT DETECTED NOT DETECTED Final   Candida tropicalis NOT DETECTED NOT DETECTED Final    Comment: Performed at [facility], [address], [place], [place] [address]     Time coordinating discharge: Over 40 minutes  SIGNED:   Marinda Elk, MD  [facility] 02/20/2019, 7:22 AM Pager   If 7PM-7AM, please contact night-coverage [email or web address] Password [email or web address]

## 2019-02-20 NOTE — Discharge Instructions (Signed)
Person Under Monitoring Name: Jesus [name]  Location: [place] [id number]   Infection Prevention Recommendations for Individuals Confirmed to have, or Being Evaluated for, 2019 Novel Coronavirus (COVID-19) Infection Who Receive Care at Home  Individuals who are confirmed to have, or are being evaluated for, COVID-19 should follow the prevention steps below until a healthcare provider or local or state health department says they can return to normal activities.  Stay home except to get medical care You should restrict activities outside your home, except for getting medical care. Do not go to work, school, or public areas, and do not use public transportation or taxis.  Call ahead before visiting your doctor Before your medical appointment, call the healthcare provider and tell them that you have, or are being evaluated for, COVID-19 infection. This will help the healthcare providers office take steps to keep other people from getting infected. Ask your healthcare provider to call the local or state health department.  Monitor your symptoms Seek prompt medical attention if your illness is worsening (e.g., difficulty breathing). Before going to your medical appointment, call the healthcare provider and tell them that you have, or are being evaluated for, COVID-19 infection. Ask your healthcare provider to call the local or state health department.  Wear a facemask You should wear a facemask that covers your nose and mouth when you are in the same room with other people and when you visit a healthcare provider. People who live with or visit you should also wear a facemask while they are in the same room with you.  Separate yourself from other people in your home As much as possible, you should stay in a different room from other people in your home. Also, you should use a separate bathroom, if available.  Avoid sharing household items You should not share  dishes, drinking glasses, cups, eating utensils, towels, bedding, or other items with other people in your home. After using these items, you should wash them thoroughly with soap and water.  Cover your coughs and sneezes Cover your mouth and nose with a tissue when you cough or sneeze, or you can cough or sneeze into your sleeve. Throw used tissues in a lined trash can, and immediately wash your hands with soap and water for at least 20 seconds or use an alcohol-based hand rub.  Wash your [facility] your hands often and thoroughly with soap and water for at least 20 seconds. You can use an alcohol-based hand sanitizer if soap and water are not available and if your hands are not visibly dirty. Avoid touching your eyes, nose, and mouth with unwashed hands.   Prevention Steps for Caregivers and Household Members of Individuals Confirmed to have, or Being Evaluated for, COVID-19 Infection Being Cared for in the Home  If you live with, or provide care at home for, a person confirmed to have, or being evaluated for, COVID-19 infection please follow these guidelines to prevent infection:  Follow healthcare providers instructions Make sure that you understand and can help the patient follow any healthcare provider instructions for all care.  Provide for the patients basic needs You should help the patient with basic needs in the home and provide support for getting groceries, prescriptions, and other personal needs.  Monitor the patients symptoms If they are getting sicker, call his or her medical provider and tell them that the patient has, or is being evaluated for, COVID-19 infection. This will help the healthcare providers office  take steps to keep other people from getting infected. Ask the healthcare provider to call the local or state health department.  Limit the number of people who have contact with the patient  If possible, have only one caregiver for the patient.  Other  household members should stay in another home or place of residence. If this is not possible, they should stay  in another room, or be separated from the patient as much as possible. Use a separate bathroom, if available.  Restrict visitors who do not have an essential need to be in the home.  Keep older adults, very young children, and other sick people away from the patient Keep older adults, very young children, and those who have compromised immune systems or chronic health conditions away from the patient. This includes people with chronic heart, lung, or kidney conditions, diabetes, and cancer.  Ensure good ventilation Make sure that shared spaces in the home have good air flow, such as from an air conditioner or an opened window, weather permitting.  Wash your hands often  Wash your hands often and thoroughly with soap and water for at least 20 seconds. You can use an alcohol based hand sanitizer if soap and water are not available and if your hands are not visibly dirty.  Avoid touching your eyes, nose, and mouth with unwashed hands.  Use disposable paper towels to dry your hands. If not available, use dedicated cloth towels and replace them when they become wet.  Wear a facemask and gloves  Wear a disposable facemask at all times in the room and gloves when you touch or have contact with the patients blood, body fluids, and/or secretions or excretions, such as sweat, saliva, sputum, nasal mucus, vomit, urine, or feces.  Ensure the mask fits over your nose and mouth tightly, and do not touch it during use.  Throw out disposable facemasks and gloves after using them. Do not reuse.  Wash your hands immediately after removing your facemask and gloves.  If your personal clothing becomes contaminated, carefully remove clothing and launder. Wash your hands after handling contaminated clothing.  Place all used disposable facemasks, gloves, and other waste in a lined container before  disposing them with other household waste.  Remove gloves and wash your hands immediately after handling these items.  Do not share dishes, glasses, or other household items with the patient  Avoid sharing household items. You should not share dishes, drinking glasses, cups, eating utensils, towels, bedding, or other items with a patient who is confirmed to have, or being evaluated for, COVID-19 infection.  After the person uses these items, you should wash them thoroughly with soap and water.  Wash laundry thoroughly  Immediately remove and wash clothes or bedding that have blood, body fluids, and/or secretions or excretions, such as sweat, saliva, sputum, nasal mucus, vomit, urine, or feces, on them.  Wear gloves when handling laundry from the patient.  Read and follow directions on labels of laundry or clothing items and detergent. In general, wash and dry with the warmest temperatures recommended on the label.  Clean all areas the individual has used often  Clean all touchable surfaces, such as counters, tabletops, doorknobs, bathroom fixtures, toilets, phones, keyboards, tablets, and bedside tables, every day. Also, clean any surfaces that may have blood, body fluids, and/or secretions or excretions on them.  Wear gloves when cleaning surfaces the patient has come in contact with.  Use a diluted bleach solution (e.g., dilute bleach with 1 part  bleach and 10 parts water) or a household disinfectant with a label that says EPA-registered for coronaviruses. To make a bleach solution at home, add 1 tablespoon of bleach to 1 quart (4 cups) of water. For a larger supply, add  cup of bleach to 1 gallon (16 cups) of water.  Read labels of cleaning products and follow recommendations provided on product labels. Labels contain instructions for safe and effective use of the cleaning product including precautions you should take when applying the product, such as wearing gloves or eye protection  and making sure you have good ventilation during use of the product.  Remove gloves and wash hands immediately after cleaning.  Monitor yourself for signs and symptoms of illness Caregivers and household members are considered close contacts, should monitor their health, and will be asked to limit movement outside of the home to the extent possible. Follow the monitoring steps for close contacts listed on the symptom monitoring form.   ? If you have additional questions, contact your local health department or call the epidemiologist on call at [phone] (available 24/7). ? This guidance is subject to change. For the most up-to-date guidance from [facility], please refer to their website: [email or web address]

## 2019-03-17 ENCOUNTER — Encounter: Payer: Self-pay | Admitting: Emergency Medicine

## 2019-03-17 ENCOUNTER — Emergency Department: Payer: Medicare Other

## 2019-03-17 ENCOUNTER — Other Ambulatory Visit: Payer: Self-pay

## 2019-03-17 ENCOUNTER — Inpatient Hospital Stay
Admission: EM | Admit: 2019-03-17 | Discharge: 2019-04-18 | DRG: 872 | Disposition: E | Payer: Medicare Other | Attending: Internal Medicine | Admitting: Internal Medicine

## 2019-03-17 DIAGNOSIS — R404 Transient alteration of awareness: Secondary | ICD-10-CM | POA: Diagnosis present

## 2019-03-17 DIAGNOSIS — F0390 Unspecified dementia without behavioral disturbance: Secondary | ICD-10-CM | POA: Diagnosis present

## 2019-03-17 DIAGNOSIS — N179 Acute kidney failure, unspecified: Secondary | ICD-10-CM | POA: Diagnosis present

## 2019-03-17 DIAGNOSIS — I959 Hypotension, unspecified: Secondary | ICD-10-CM | POA: Diagnosis present

## 2019-03-17 DIAGNOSIS — Z66 Do not resuscitate: Secondary | ICD-10-CM | POA: Diagnosis present

## 2019-03-17 DIAGNOSIS — Z7982 Long term (current) use of aspirin: Secondary | ICD-10-CM | POA: Diagnosis not present

## 2019-03-17 DIAGNOSIS — I2510 Atherosclerotic heart disease of native coronary artery without angina pectoris: Secondary | ICD-10-CM | POA: Diagnosis present

## 2019-03-17 DIAGNOSIS — F0280 Dementia in other diseases classified elsewhere without behavioral disturbance: Secondary | ICD-10-CM

## 2019-03-17 DIAGNOSIS — Z79899 Other long term (current) drug therapy: Secondary | ICD-10-CM

## 2019-03-17 DIAGNOSIS — E119 Type 2 diabetes mellitus without complications: Secondary | ICD-10-CM | POA: Diagnosis present

## 2019-03-17 DIAGNOSIS — I454 Nonspecific intraventricular block: Secondary | ICD-10-CM | POA: Diagnosis present

## 2019-03-17 DIAGNOSIS — Z8619 Personal history of other infectious and parasitic diseases: Secondary | ICD-10-CM

## 2019-03-17 DIAGNOSIS — J90 Pleural effusion, not elsewhere classified: Secondary | ICD-10-CM | POA: Diagnosis not present

## 2019-03-17 DIAGNOSIS — A419 Sepsis, unspecified organism: Principal | ICD-10-CM | POA: Diagnosis present

## 2019-03-17 DIAGNOSIS — Z888 Allergy status to other drugs, medicaments and biological substances status: Secondary | ICD-10-CM | POA: Diagnosis not present

## 2019-03-17 DIAGNOSIS — R778 Other specified abnormalities of plasma proteins: Secondary | ICD-10-CM

## 2019-03-17 DIAGNOSIS — I10 Essential (primary) hypertension: Secondary | ICD-10-CM | POA: Diagnosis present

## 2019-03-17 DIAGNOSIS — I252 Old myocardial infarction: Secondary | ICD-10-CM | POA: Diagnosis not present

## 2019-03-17 DIAGNOSIS — Z7984 Long term (current) use of oral hypoglycemic drugs: Secondary | ICD-10-CM

## 2019-03-17 DIAGNOSIS — G309 Alzheimer's disease, unspecified: Secondary | ICD-10-CM

## 2019-03-17 DIAGNOSIS — D649 Anemia, unspecified: Secondary | ICD-10-CM | POA: Diagnosis present

## 2019-03-17 DIAGNOSIS — Z7902 Long term (current) use of antithrombotics/antiplatelets: Secondary | ICD-10-CM | POA: Diagnosis not present

## 2019-03-17 DIAGNOSIS — Z87440 Personal history of urinary (tract) infections: Secondary | ICD-10-CM | POA: Diagnosis not present

## 2019-03-17 LAB — COMPREHENSIVE METABOLIC PANEL
ALT: 16 U/L (ref 0–44)
AST: 18 U/L (ref 15–41)
Albumin: 2.9 g/dL — ABNORMAL LOW (ref 3.5–5.0)
Alkaline Phosphatase: 84 U/L (ref 38–126)
Anion gap: 14 (ref 5–15)
BUN: 19 mg/dL (ref 8–23)
CO2: 19 mmol/L — ABNORMAL LOW (ref 22–32)
Calcium: 8.4 mg/dL — ABNORMAL LOW (ref 8.9–10.3)
Chloride: 100 mmol/L (ref 98–111)
Creatinine, Ser: 1.35 mg/dL — ABNORMAL HIGH (ref 0.61–1.24)
GFR calc Af Amer: 56 mL/min — ABNORMAL LOW (ref >60)
GFR calc non Af Amer: 49 mL/min — ABNORMAL LOW (ref >60)
Glucose, Bld: 318 mg/dL — ABNORMAL HIGH (ref 70–99)
Potassium: 4.1 mmol/L (ref 3.5–5.1)
Sodium: 133 mmol/L — ABNORMAL LOW (ref 135–145)
Total Bilirubin: 0.9 mg/dL (ref 0.3–1.2)
Total Protein: 6.6 g/dL (ref 6.5–8.1)

## 2019-03-17 LAB — URINALYSIS, COMPLETE (UACMP) WITH MICROSCOPIC
Bilirubin Urine: NEGATIVE
Glucose, UA: NEGATIVE mg/dL
Hgb urine dipstick: NEGATIVE
Ketones, ur: NEGATIVE mg/dL
Leukocytes,Ua: NEGATIVE
Nitrite: NEGATIVE
Protein, ur: NEGATIVE mg/dL
Specific Gravity, Urine: 1.024 (ref 1.005–1.030)
Squamous Epithelial / HPF: NONE SEEN (ref 0–5)
pH: 5 (ref 5.0–8.0)

## 2019-03-17 LAB — CBC
HCT: 26.5 % — ABNORMAL LOW (ref 39.0–52.0)
Hemoglobin: 8.4 g/dL — ABNORMAL LOW (ref 13.0–17.0)
MCH: 29.2 pg (ref 26.0–34.0)
MCHC: 31.7 g/dL (ref 30.0–36.0)
MCV: 92 fL (ref 80.0–100.0)
Platelets: 310 10*3/uL (ref 150–400)
RBC: 2.88 MIL/uL — ABNORMAL LOW (ref 4.22–5.81)
RDW: 17.1 % — ABNORMAL HIGH (ref 11.5–15.5)
WBC: 13.4 10*3/uL — ABNORMAL HIGH (ref 4.0–10.5)
nRBC: 0 % (ref 0.0–0.2)

## 2019-03-17 LAB — LACTIC ACID, PLASMA
Lactic Acid, Venous: 4.4 mmol/L (ref 0.5–1.9)
Lactic Acid, Venous: 8.8 mmol/L (ref 0.5–1.9)
Lactic Acid, Venous: 10 mmol/L (ref 0.5–1.9)

## 2019-03-17 LAB — TROPONIN I (HIGH SENSITIVITY)
Troponin I (High Sensitivity): 401 ng/L (ref <18)
Troponin I (High Sensitivity): 355 ng/L (ref <18)

## 2019-03-17 LAB — BRAIN NATRIURETIC PEPTIDE: B Natriuretic Peptide: 1889 pg/mL — ABNORMAL HIGH (ref 0.0–100.0)

## 2019-03-17 LAB — LIPASE, BLOOD: Lipase: 19 U/L (ref 11–51)

## 2019-03-17 MED ORDER — SODIUM CHLORIDE 0.9 % IV BOLUS
1000.0000 mL | Freq: Once | INTRAVENOUS | Status: AC
  Administered 2019-03-17: 14:00:00 1000 mL via INTRAVENOUS

## 2019-03-17 MED ORDER — CLOPIDOGREL BISULFATE 75 MG PO TABS: 75.0000 mg | ORAL_TABLET | Freq: Every day | ORAL | Status: DC

## 2019-03-17 MED ORDER — METRONIDAZOLE IN NACL 5-0.79 MG/ML-% IV SOLN
500.0000 mg | Freq: Once | INTRAVENOUS | Status: AC
  Administered 2019-03-17: 500 mg via INTRAVENOUS
  Filled 2019-03-17: qty 100

## 2019-03-17 MED ORDER — SODIUM CHLORIDE 0.9 % IV BOLUS: 1000.0000 mL | Freq: Once | INTRAVENOUS | Status: DC

## 2019-03-17 MED ORDER — SODIUM CHLORIDE 0.9 % IV SOLN: INTRAVENOUS | Status: DC

## 2019-03-17 MED ORDER — FAMOTIDINE 20 MG PO TABS: 20.0000 mg | ORAL_TABLET | Freq: Every day | ORAL | Status: DC

## 2019-03-17 MED ORDER — IOHEXOL 300 MG/ML  SOLN
75.0000 mL | Freq: Once | INTRAMUSCULAR | Status: AC | PRN
  Administered 2019-03-17: 75 mL via INTRAVENOUS

## 2019-03-17 MED ORDER — DONEPEZIL HCL 5 MG PO TABS
20.0000 mg | ORAL_TABLET | Freq: Every day | ORAL | Status: DC
  Administered 2019-03-17: 20 mg via ORAL
  Filled 2019-03-17: qty 4

## 2019-03-17 MED ORDER — ONDANSETRON HCL 4 MG/2ML IJ SOLN
4.0000 mg | Freq: Once | INTRAMUSCULAR | Status: AC | PRN
  Administered 2019-03-17: 4 mg via INTRAVENOUS
  Filled 2019-03-17: qty 2

## 2019-03-17 MED ORDER — SODIUM CHLORIDE 0.9 % IV SOLN
2.0000 g | Freq: Once | INTRAVENOUS | Status: AC
  Administered 2019-03-17: 2 g via INTRAVENOUS
  Filled 2019-03-17: qty 2

## 2019-03-17 MED ORDER — OCUVITE-LUTEIN PO CAPS
1.0000 | ORAL_CAPSULE | Freq: Every day | ORAL | Status: DC
  Administered 2019-03-17: 1 via ORAL
  Filled 2019-03-17: qty 1

## 2019-03-17 MED ORDER — SODIUM CHLORIDE 0.9% FLUSH: 3.0000 mL | Freq: Once | INTRAVENOUS | Status: DC

## 2019-03-17 MED ORDER — VANCOMYCIN HCL IN DEXTROSE 1-5 GM/200ML-% IV SOLN
1000.0000 mg | Freq: Once | INTRAVENOUS | Status: AC
  Administered 2019-03-17: 1000 mg via INTRAVENOUS
  Filled 2019-03-17: qty 200

## 2019-03-17 MED ORDER — SODIUM CHLORIDE 0.9% FLUSH: 3.0000 mL | Freq: Two times a day (BID) | INTRAVENOUS | Status: DC

## 2019-03-17 MED ORDER — ASPIRIN 81 MG PO CHEW: 81.0000 mg | CHEWABLE_TABLET | Freq: Every day | ORAL | Status: DC

## 2019-03-17 MED ORDER — TAMSULOSIN HCL 0.4 MG PO CAPS
0.8000 mg | ORAL_CAPSULE | Freq: Every day | ORAL | Status: DC
  Administered 2019-03-17: 0.8 mg via ORAL
  Filled 2019-03-17: qty 2

## 2019-03-17 MED ORDER — FENTANYL CITRATE (PF) 100 MCG/2ML IJ SOLN
50.0000 ug | Freq: Once | INTRAMUSCULAR | Status: AC
  Administered 2019-03-17: 50 ug via INTRAVENOUS
  Filled 2019-03-17: qty 2

## 2019-03-17 MED ORDER — OMEGA-3-ACID ETHYL ESTERS 1 G PO CAPS
2.0000 g | ORAL_CAPSULE | Freq: Every day | ORAL | Status: DC
  Filled 2019-03-17: qty 2

## 2019-03-17 NOTE — ED Notes (Signed)
Pt appears extremely uncomfortable and constantly moving around in the bed. RN unable to keep monitor leads and pulse ox in place.

## 2019-03-17 NOTE — ED Notes (Signed)
Attempted to draw lactic acid, unable to draw off IV. Lab called, said they only have 1 phlebotomist in house and they may not get there. IV team consult placed as patient only has one line in R hand. Will attempt straight stick

## 2019-03-17 NOTE — ED Notes (Signed)
Warm blankets on pt. Pt deneis needs

## 2019-03-17 NOTE — ED Notes (Addendum)
Bear hugger placed on patient. NP Ouma notified of pt's lactic continuing to trend up and rectal temp of 94.7. NP Ouma responded that both concerns were "noted". No new orders received.

## 2019-03-17 NOTE — Consult Note (Signed)
PHARMACY -  BRIEF ANTIBIOTIC NOTE   Pharmacy has received consult(s) for sepsis from an ED provider.  The patient's profile has been reviewed for ht/wt/allergies/indication/available labs.    One time order(s) placed for cefepime and vancomycin   Further antibiotics/pharmacy consults should be ordered by admitting physician if indicated.                       Thank you, Oswald Hillock 03/01/2019  2:57 PM

## 2019-03-17 NOTE — ED Notes (Signed)
Pt placed on 2 L. 

## 2019-03-17 NOTE — Progress Notes (Signed)
Notified provider of need to draw repeat lactic acid. 

## 2019-03-17 NOTE — H&P (Signed)
History and Physical    Jesus [name] ACZ:[id number] DOB: 1934-11-10 DOA: 28-Mar-2019  PCP: Barbette Reichmann, MD  Patient coming from: Home   Chief Complaint: hypotension, shaking  HPI:  Jesus [name] is a 84 y.o. male with medical history significant for type 2 diabetes mellitus, hypertension, dementia and normocytic anemia who presents to the emergency department when family found him with "similar sx as when he had UTI".  Please note patient has underlying dementia and history was given by granddaughter and son by phone.  Per family for the past 3 days patient has not been feeling bad reports patient has similar symptoms when he was recently admitted for urinary tract infection.  Family reported he has been having high pulse rate, low blood pressure and shakiness.  He was complaining of some abdominal pain.  Denied any fever, shortness of breath, or chest pain. Of note patient was recently admitted this month for fever and Klebsiella bacteremia and  UTI.  Cholecystitis was ruled out.  In October he was found with COVID-19 infection and was admitted to [facility] for COVID-19 pneumonia and discharged in stable condition was brought back from skilled nursing facility for fever shortness of breath and confusion.    ED Course: In the ED patient was given IV fluids, started on triple IV antibiotics and CT of head and abdomen were obtained.  Blood cultures urine cultures were obtained    Review of Systems: All systems reviewed and otherwise negative.    Past Medical History:  Diagnosis Date   Diabetes mellitus without complication (HCC)    Hypertension     Past Surgical History:  Procedure Laterality Date   APPENDECTOMY     LEFT HEART CATH AND CORONARY ANGIOGRAPHY N/A 10/26/2017   Procedure: LEFT HEART CATH AND CORONARY ANGIOGRAPHY;  Surgeon: Dalia Heading, MD;  Location: [facility] IN[facility]IVE [facility];  Service: Cardiovascular;  Laterality: N/A;     reports that he has never smoked. He has never  used smokeless tobacco. He reports that he does not drink alcohol or use drugs.  Allergies  Allergen Reactions   Zosyn [Piperacillin-Tazobactam In Dex] Rash    History reviewed. No pertinent family history.   Prior to Admission medications   Medication Sig Start Date End Date Taking? Authorizing Provider  amLODipine (NORVASC) 10 MG tablet Take 10 mg by mouth daily.    Yes [provider]  aspirin 81 MG chewable tablet Chew 81 mg by mouth daily.   Yes [provider]  clopidogrel (PLAVIX) 75 MG tablet Take 75 mg by mouth daily.   Yes [provider]  donepezil (ARICEPT) 10 MG tablet Take 20 mg by mouth at bedtime.    Yes [provider]  glipiZIDE (GLUCOTROL) 10 MG tablet Take 20 mg by mouth 2 (two) times daily before a meal.   Yes [provider]  guaiFENesin-dextromethorphan (ROBITUSSIN DM) 100-10 MG/5ML syrup Take 10 mLs by mouth every 4 (four) hours as needed for cough. 02/04/19  Yes Osvaldo Shipper, MD  Maltodextrin-Xanthan Gum (RESOURCE THICKENUP CLEAR) POWD Use to thicken fluids 02/04/19  Yes Osvaldo Shipper, MD  Multiple Vitamins-Minerals (PRESERVISION AREDS 2+MULTI VIT) CAPS Take 1 capsule by mouth at bedtime.   Yes [provider]  omega-3 acid ethyl esters (LOVAZA) 1 g capsule Take 2 g by mouth daily.   Yes [provider]  pioglitazone (ACTOS) 30 MG tablet Take 30 mg by mouth daily. 10/11/17  Yes [provider]  tamsulosin (FLOMAX) 0.4 MG CAPS capsule  Take 0.8 mg by mouth at bedtime.   Yes [provider]  amoxicillin-clavulanate (AUGMENTIN) 875-125 MG tablet Take 1 tablet by mouth every 12 (twelve) hours. Patient not taking: Reported on 06-21-2018 02/20/19   Marinda ElkFeliz Ortiz, Abraham, MD  dexamethasone (DECADRON) 2 MG tablet Take 2 tablets once daily for 3 days, then 1 tablet once daily for 3 days, then STOP. Patient not taking: Reported on 02/16/2019 02/04/19   Osvaldo ShipperKrishnan, Gokul, MD  famotidine (PEPCID)  20 MG tablet Take 1 tablet (20 mg total) by mouth daily for 10 days. 02/05/19 02/15/19  Osvaldo ShipperKrishnan, Gokul, MD    Physical Exam: Vitals:   05-17-2018 1301 05-17-2018 1428 05-17-2018 1430 05-17-2018 1600  BP: 93/60 (!) 90/57 (!) 90/57 102/60  Pulse:  97  92  Resp:  (!) 25 (!) 26 (!) 26  Temp:      TempSrc:      SpO2:  93%  (!) 79%  Weight:      Height:        Constitutional: NAD, calm, comfortable Vitals:   05-17-2018 1301 05-17-2018 1428 05-17-2018 1430 05-17-2018 1600  BP: 93/60 (!) 90/57 (!) 90/57 102/60  Pulse:  97  92  Resp:  (!) 25 (!) 26 (!) 26  Temp:      TempSrc:      SpO2:  93%  (!) 79%  Weight:      Height:       Eyes:EOMI, lids and conjunctivae normal ENMT: wearing mask Neck: normal, supple, no masses, Respiratory: decrease bs at bases, b/l fine scattered dry rales Cardiovascular: Regular rate and rhythm, no murmurs / rubs / gallops. No extremity edema. Abdomen: Soft, nt/nd +bs  Musculoskeletal: no clubbing / cyanosis. No joint deformity upper and lower extremities. Good ROM, no contractures. Normal muscle tone.  Skin: no rashes, lesions, ulcers. No induration Neurologic: CN 2-12 grossly intact. Sensation intact, DTR normal. Strength 5/5 in all 4.  Psychiatric: Normal judgment and insight. Alert and oriented x 3. Normal mood.    Labs on Admission: I have personally reviewed following labs and imaging studies  CBC: Recent Labs  Lab 05-17-2018 1200  WBC 13.4*  HGB 8.4*  HCT 26.5*  MCV 92.0  PLT 310   Basic Metabolic Panel: Recent Labs  Lab 05-17-2018 1200  NA 133*  K 4.1  CL 100  CO2 19*  GLUCOSE 318*  BUN 19  CREATININE 1.35*  CALCIUM 8.4*   GFR: Estimated Creatinine Clearance: 40.1 mL/min (A) (by C-G formula based on SCr of 1.35 mg/dL (H)). Liver Function Tests: Recent Labs  Lab 05-17-2018 1200  AST 18  ALT 16  ALKPHOS 84  BILITOT 0.9  PROT 6.6  ALBUMIN 2.9*   Recent Labs  Lab 05-17-2018 1200  LIPASE 19   No results for input(s): AMMONIA in the last  168 hours. Coagulation Profile: No results for input(s): INR, PROTIME in the last 168 hours. Cardiac Enzymes: No results for input(s): CKTOTAL, CKMB, CKMBINDEX, TROPONINI in the last 168 hours. BNP (last 3 results) No results for input(s): PROBNP in the last 8760 hours. HbA1C: No results for input(s): HGBA1C in the last 72 hours. CBG: No results for input(s): GLUCAP in the last 168 hours. Lipid Profile: No results for input(s): CHOL, HDL, LDLCALC, TRIG, CHOLHDL, LDLDIRECT in the last 72 hours. Thyroid Function Tests: No results for input(s): TSH, T4TOTAL, FREET4, T3FREE, THYROIDAB in the last 72 hours. Anemia Panel: No results for input(s): VITAMINB12, FOLATE, FERRITIN, TIBC, IRON, RETICCTPCT in the last 72 hours.  Urine analysis:    Component Value Date/Time   COLORURINE YELLOW (A) 02/15/2019 2038   APPEARANCEUR CLOUDY (A) 02/15/2019 2038   LABSPEC 1.011 02/15/2019 2038   PHURINE 5.0 02/15/2019 2038   GLUCOSEU >=500 (A) 02/15/2019 2038   HGBUR NEGATIVE 02/15/2019 2038   [place] 02/15/2019 2038   [place] 02/15/2019 2038   PROTEINUR NEGATIVE 02/15/2019 2038   NITRITE NEGATIVE 02/15/2019 2038   LEUKOCYTESUR MODERATE (A) 02/15/2019 2038    Radiological Exams on Admission: Dg Chest 1 View  Result Date: 03/04/2019 CLINICAL DATA:  Fever. EXAM: CHEST  1 VIEW COMPARISON:  CT chest 02/15/2019.  Chest x-ray 02/15/2019. FINDINGS: Cardiac pacer with lead tip over the right atrium right ventricle. Cardiomegaly. Bilateral pulmonary interstitial prominence, progressed from prior exam. Bibasilar atelectasis bilateral pleural effusions. No pneumothorax. IMPRESSION: 1. Cardiac pacer with lead tips over the right atrium right ventricle. 2. Cardiomegaly with bilateral pulmonary interstitial prominence and bilateral pleural effusions consistent with CHF. Pneumonitis cannot be excluded. Bibasilar atelectasis. Findings have worsened from prior study. Electronically Signed   By:  Marcello Moores  Register   On: 02/25/2019 15:26   Ct Head Wo Contrast  Result Date: 02/18/2019 CLINICAL DATA:  84 year old male with history of altered level of consciousness. EXAM: CT HEAD WITHOUT CONTRAST TECHNIQUE: Contiguous axial images were obtained from the base of the skull through the vertex without intravenous contrast. COMPARISON:  Head CT 02/15/2019. FINDINGS: Brain: Mild cerebral atrophy. Patchy and confluent areas of decreased attenuation are noted throughout the deep and periventricular white matter of the cerebral hemispheres bilaterally, compatible with chronic microvascular ischemic disease. Well-defined focus of low attenuation in the left basal ganglia, compatible with an old lacunar infarct. No evidence of acute infarction, hemorrhage, hydrocephalus, extra-axial collection or mass lesion/mass effect. Vascular: No hyperdense vessel or unexpected calcification. Skull: Normal. Negative for fracture or focal lesion. Sinuses/Orbits: No acute finding. Mucosal retention cyst or polyp in the medial aspect of the left maxillary sinus measuring 2.1 x 1.3 cm. Mucosal thickening in the left posterior ethmoid sinuses. Other: None. IMPRESSION: 1. No acute intracranial abnormalities. 2. 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 Electronically Signed   By: Vinnie Langton M.D.   On: 02/23/2019 13:46   Ct Abdomen Pelvis W Contrast  Result Date: 03/07/2019 CLINICAL DATA:  84 year old male with history of acute generalized abdominal pain. EXAM: CT ABDOMEN AND PELVIS WITH CONTRAST TECHNIQUE: Multidetector CT imaging of the abdomen and pelvis was performed using the standard protocol following bolus administration of intravenous contrast. CONTRAST:  74mL OMNIPAQUE IOHEXOL 300 MG/ML  SOLN COMPARISON:  CT the abdomen and pelvis 02/15/2019. FINDINGS: Lower chest: Moderate bilateral pleural effusions lying  dependently. Mild scarring in the lung bases bilaterally, likely secondary to resolving COVID-19 infection. Aortic atherosclerosis. Calcified atherosclerotic plaque in the left anterior descending, left circumflex and right coronary arteries. Mild calcifications of the aortic valve. Pacemaker leads in the right atrium and right ventricle. Hepatobiliary: No suspicious cystic or solid hepatic lesions. No intra or extrahepatic biliary ductal dilatation. Gallbladder is normal in appearance. Pancreas: No pancreatic mass. No pancreatic ductal dilatation. No pancreatic or peripancreatic fluid collections or inflammatory changes. Spleen: Unremarkable. Adrenals/Urinary Tract: Bilateral kidneys and adrenal glands are normal in appearance. No hydroureteronephrosis. Urinary bladder is normal in appearance. Stomach/Bowel: Normal appearance of the stomach. No pathologic dilatation of small bowel or colon. A few scattered colonic diverticulae are noted, without surrounding inflammatory changes to suggest  an acute diverticulitis at this time. The appendix is not confidently identified and may be surgically absent. Regardless, there are no inflammatory changes noted adjacent to the cecum to suggest the presence of an acute appendicitis at this time. Vascular/Lymphatic: Aortic atherosclerosis, without evidence of aneurysm or dissection in the abdominal or pelvic vasculature. Circumaortic left renal vein (normal anatomical variant) noted. No lymphadenopathy noted in the abdomen or pelvis. Reproductive: Prostate gland and seminal vesicles are unremarkable in appearance. Other: No significant volume of ascites.  No pneumoperitoneum. Musculoskeletal: There are no aggressive appearing lytic or blastic lesions noted in the visualized portions of the skeleton. IMPRESSION: 1. No acute findings are noted in the abdomen or pelvis to account for the patient's symptoms. 2. Moderate bilateral pleural effusions. Findings in the visualize lung  bases most compatible with scarring from resolving COVID-19 infection. 3. Aortic atherosclerosis, in addition to at least 3 vessel coronary artery disease. 4. Mild colonic diverticulosis without evidence of acute diverticulitis at this time. 5. There are calcifications of the aortic valve. Echocardiographic correlation for evaluation of potential valvular dysfunction may be warranted if clinically indicated. 6. Additional incidental findings, as above. Electronically Signed   By: Trudie Reed M.D.   On: Mar 29, 2019 14:00    EKG: Independently reviewed.SR, IVCD, inferior Q   Assessment/Plan Active Problems:   Sepsis (HCC)    1.sepsis- possibly 2/2 UTI since had similar issue in the past Pt was hypotensive and tachy, and leukocytosis Continue with ivabx  F/u bcx and ua, ucx which was suppose to be obtained in [facility]  2. AKI- mild possibly prerenal 2/2 hypotension/infection/?chf  3. Elevated TP-EKG appears nonischemic. Possibly 2/2 demand ischemia as pt was hypotensive and tachy. cxr ?chf Obtain echo Ck bnp  4. HTN- hold bp meds as he is hypotensive  5. DM- hold po meds, riss, ck fs  6. Recent covid infection- in 02/15/19  7. Dementia-continue donepezil  8.Pleural effusion- on CT. Likely recent post Covid infection. May consider thoracentesis in a day or 2 once his blood pressure is more stable  9.hx/o cad- on asa and plavix, will continue   DVT prophylaxis: SCD  Code Status: DNR- spoke to son who is HCP and confirms DNR  Family Communication: Spoke to health proxy the son and granddaughter Disposition Plan: Will be admitted as inpatient as he requires more than 2 midnight stays Consults called: None Admission status: Inpatient   Lynn Ito MD [facility] Pager [phone]   If 7PM-7AM, please contact night-coverage [email or web address] Password [email or web address]  2019-03-29, 4:11 PM

## 2019-03-17 NOTE — ED Provider Notes (Addendum)
[facility] Emergency Department Provider Note  ____________________________________________   First MD Initiated Contact with Patient 02/27/2019 1255     (approximate)  I have reviewed the triage vital signs and the nursing notes.   HISTORY  Chief Complaint Abdominal Pain    HPI Jesus [name] is a 84 y.o. male with diabetes, hypertension, dementia who comes in for abdominal pain.  Patient had recent admission back in early November for Klebsiella bacteremia due to Klebsiella UTI.  He had a HIDA scan that was negative for cholecystitis.  Patient was discharged home on amoxicillin.   Discussed with patient's son.  Patient woke up 3 days at nighttime and seem to be in pain.  Patient seem to be point to the abdomen.  Has not been acting his normal self therefore he brought him back to the ER due to concern for reinfection.   Unable to get full HPI due to patient's baseline dementia.       Past Medical History:  Diagnosis Date   Diabetes mellitus without complication ([place])    Hypertension     Patient Active Problem List   Diagnosis Date Noted   Bacteremia due to Klebsiella pneumoniae 02/20/2019   UTI (urinary tract infection) 02/16/2019   Prolonged QT interval 02/16/2019   Hyponatremia 02/16/2019   Lactic acidosis 02/16/2019   Acute cholecystitis 02/16/2019   Acute on chronic respiratory failure with hypoxia ([place]) 02/16/2019   Sepsis due to Klebsiella ([place]) 02/16/2019   Klebsiella sepsis ([place]) 02/16/2019   Viral pneumonia 01/22/2019   Pneumonia due to COVID-19 virus 01/22/2019   Dementia without behavioral disturbance ([place]) 01/22/2019   Encephalopathy due to COVID-19 virus 01/22/2019   DM (diabetes mellitus) ([place]) 01/22/2019   HTN (hypertension) 01/22/2019   NSTEMI (non-ST elevated myocardial infarction) ([place]) 10/22/2017    Past Surgical History:  Procedure Laterality Date   APPENDECTOMY     LEFT HEART CATH AND CORONARY  ANGIOGRAPHY N/A 10/26/2017   Procedure: LEFT HEART CATH AND CORONARY ANGIOGRAPHY;  Surgeon: Teodoro Spray, MD;  Location: [place] [facility];  Service: Cardiovascular;  Laterality: N/A;    Prior to Admission medications   Medication Sig Start Date End Date Taking? Authorizing Provider  amLODipine (NORVASC) 10 MG tablet Take 10 mg by mouth daily.     [provider]  amoxicillin-clavulanate (AUGMENTIN) 875-125 MG tablet Take 1 tablet by mouth every 12 (twelve) hours. 02/20/19   Charlynne Cousins, MD  aspirin 81 MG chewable tablet Chew 81 mg by mouth daily.    [provider]  clopidogrel (PLAVIX) 75 MG tablet Take 75 mg by mouth daily.    [provider]  dexamethasone (DECADRON) 2 MG tablet Take 2 tablets once daily for 3 days, then 1 tablet once daily for 3 days, then STOP. Patient not taking: Reported on 02/16/2019 02/04/19   Bonnielee Haff, MD  donepezil (ARICEPT) 10 MG tablet Take 20 mg by mouth at bedtime.     [provider]  famotidine (PEPCID) 20 MG tablet Take 1 tablet (20 mg total) by mouth daily for 10 days. 02/05/19 02/15/19  Bonnielee Haff, MD  glipiZIDE (GLUCOTROL) 10 MG tablet Take 20 mg by mouth 2 (two) times daily before a meal.    [provider]  guaiFENesin-dextromethorphan (ROBITUSSIN DM) 100-10 MG/5ML syrup Take 10 mLs by mouth every 4 (four) hours as needed for cough. 02/04/19   Bonnielee Haff, MD  Maltodextrin-Xanthan Gum ([place]) POWD Use to thicken fluids 02/04/19  Bonnielee Haff, MD  Multiple Vitamins-Minerals (PRESERVISION AREDS 2+MULTI VIT) CAPS Take 1 capsule by mouth at bedtime.    [provider]  omega-3 acid ethyl esters (LOVAZA) 1 g capsule Take 2 g by mouth daily.    [provider]  pioglitazone (ACTOS) 30 MG tablet Take 30 mg by mouth daily. 10/11/17   [provider]  tamsulosin (FLOMAX) 0.4 MG CAPS capsule Take 0.8 mg by mouth at bedtime.    [provider]    Allergies Zosyn [piperacillin-tazobactam in dex]  History reviewed. No pertinent family history.  Social History Social History   Tobacco Use   Smoking status: Never Smoker   Smokeless tobacco: Never Used  Substance Use Topics   Alcohol use: No   Drug use: No      Review of Systems Unable to get full HPI due to patient's baseline dementia _____________________   PHYSICAL EXAM:  VITAL SIGNS: ED Triage Vitals  Enc Vitals Group     BP --      Pulse Rate 03/12/2019 1157 100     Resp 02/17/2019 1157 (!) 23     Temp 03/14/2019 1157 97.7 F (36.5 C)     Temp Source 02/27/2019 1157 Oral     SpO2 02/28/2019 1157 92 %     Weight 03/15/2019 1159 148 lb 2.4 oz (67.2 kg)     Height 02/25/2019 1159 '5\' 10"'  (1.778 m)     Head Circumference --      Peak Flow --      Pain Score --      Pain Loc --      Pain Edu? --      Excl. in [place]? --     Constitutional: Alert but not oriented. Eyes: Conjunctivae are normal. EOMI. Head: Atraumatic. Nose: No congestion/rhinnorhea. Mouth/Throat: Mucous membranes are moist.   Neck: No stridor. Trachea Midline. FROM Cardiovascular: Normal rate, regular rhythm. Grossly normal heart sounds.  Good peripheral circulation. Respiratory: Normal respiratory effort.  No retractions. Lungs CTAB. Gastrointestinal: Soft possibly tender but difficult to tell due to dementia.  No distention. No abdominal bruits.  Musculoskeletal: No lower extremity tenderness nor edema.  No joint effusions. Neurologic: Squeezes bilateral hands.  Difficulty getting him to follow commands.  Moving all extremities well.  No facial droop. Skin:  Skin is warm, dry and intact. No rash noted. Psychiatric: Unable to fully assess. GU: Deferred   ____________________________________________   LABS (all labs ordered are listed, but only abnormal results are displayed)  Labs Reviewed  COMPREHENSIVE METABOLIC PANEL - Abnormal; Notable for the following components:       Result Value   Sodium 133 (*)    CO2 19 (*)    Glucose, Bld 318 (*)    Creatinine, Ser 1.35 (*)    Calcium 8.4 (*)    Albumin 2.9 (*)    GFR calc non Af Amer 49 (*)    GFR calc Af Amer 56 (*)    All other components within normal limits  CBC - Abnormal; Notable for the following components:   WBC 13.4 (*)    RBC 2.88 (*)    Hemoglobin 8.4 (*)    HCT 26.5 (*)    RDW 17.1 (*)    All other components within normal limits  TROPONIN I (HIGH SENSITIVITY) - Abnormal; Notable for the following components:   Troponin I (High Sensitivity) 401 (*)    All other components within normal limits  CULTURE, BLOOD (ROUTINE X 2)  CULTURE, BLOOD (ROUTINE X 2)  URINE CULTURE  LIPASE, BLOOD  URINALYSIS, COMPLETE (UACMP) WITH MICROSCOPIC  LACTIC ACID, PLASMA  LACTIC ACID, PLASMA  URINALYSIS, ROUTINE W REFLEX MICROSCOPIC  TROPONIN I (HIGH SENSITIVITY)   ____________________________________________   ED ECG REPORT I, Vanessa [place], the attending physician, personally viewed and interpreted this ECG.  EKG is sinus rate of 90, no ST elevation but he does have some ST depression in V5 4 through V6 with T wave inversion in aVL and V2.  He also has some widened QRS.  T wave inversions looks similar to prior but the depression does look new. ____________________________________________  RADIOLOGY I, Vanessa [place], personally viewed and evaluated these images (plain radiographs) as part of my medical decision making, as well as reviewing the written report by the radiologist.  ED MD interpretation: Mild bilateral pleural effusions  Official radiology report(s): Dg Chest 1 View  Result Date: 02/21/2019 CLINICAL DATA:  Fever. EXAM: CHEST  1 VIEW COMPARISON:  CT chest 02/15/2019.  Chest x-ray 02/15/2019. FINDINGS: Cardiac pacer with lead tip over the right atrium right ventricle. Cardiomegaly. Bilateral pulmonary interstitial prominence, progressed from prior exam. Bibasilar atelectasis bilateral pleural  effusions. No pneumothorax. IMPRESSION: 1. Cardiac pacer with lead tips over the right atrium right ventricle. 2. Cardiomegaly with bilateral pulmonary interstitial prominence and bilateral pleural effusions consistent with CHF. Pneumonitis cannot be excluded. Bibasilar atelectasis. Findings have worsened from prior study. Electronically Signed   By: Marcello Moores  Register   On: 02/21/2019 15:26   Ct Head Wo Contrast  Result Date: 03/06/2019 CLINICAL DATA:  84 year old male with history of altered level of consciousness. EXAM: CT HEAD WITHOUT CONTRAST TECHNIQUE: Contiguous axial images were obtained from the base of the skull through the vertex without intravenous contrast. COMPARISON:  Head CT 02/15/2019. FINDINGS: Brain: Mild cerebral atrophy. Patchy and confluent areas of decreased attenuation are noted throughout the deep and periventricular white matter of the cerebral hemispheres bilaterally, compatible with chronic microvascular ischemic disease. Well-defined focus of low attenuation in the left basal ganglia, compatible with an old lacunar infarct. No evidence of acute infarction, hemorrhage, hydrocephalus, extra-axial collection or mass lesion/mass effect. Vascular: No hyperdense vessel or unexpected calcification. Skull: Normal. Negative for fracture or focal lesion. Sinuses/Orbits: No acute finding. Mucosal retention cyst or polyp in the medial aspect of the left maxillary sinus measuring 2.1 x 1.3 cm. Mucosal thickening in the left posterior ethmoid sinuses. Other: None. IMPRESSION: 1. No acute intracranial abnormalities. 2. 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 Electronically Signed   By: Vinnie Langton M.D.   On: 03/16/2019 13:46   Ct Abdomen Pelvis W Contrast  Result Date: 02/17/2019 CLINICAL DATA:  84 year old male with history of acute generalized abdominal pain. EXAM: CT ABDOMEN AND  PELVIS WITH CONTRAST TECHNIQUE: Multidetector CT imaging of the abdomen and pelvis was performed using the standard protocol following bolus administration of intravenous contrast. CONTRAST:  19m OMNIPAQUE IOHEXOL 300 MG/ML  SOLN COMPARISON:  CT the abdomen and pelvis 02/15/2019. FINDINGS: Lower chest: Moderate bilateral pleural effusions lying dependently. Mild scarring in the lung bases bilaterally, likely secondary to resolving COVID-19 infection. Aortic atherosclerosis. Calcified atherosclerotic plaque in the left anterior descending, left circumflex and right coronary arteries. Mild calcifications of the aortic valve. Pacemaker leads in the right atrium and right ventricle. Hepatobiliary: No suspicious cystic or solid hepatic lesions. No intra or extrahepatic biliary ductal dilatation. Gallbladder is normal  in appearance. Pancreas: No pancreatic mass. No pancreatic ductal dilatation. No pancreatic or peripancreatic fluid collections or inflammatory changes. Spleen: Unremarkable. Adrenals/Urinary Tract: Bilateral kidneys and adrenal glands are normal in appearance. No hydroureteronephrosis. Urinary bladder is normal in appearance. Stomach/Bowel: Normal appearance of the stomach. No pathologic dilatation of small bowel or colon. A few scattered colonic diverticulae are noted, without surrounding inflammatory changes to suggest an acute diverticulitis at this time. The appendix is not confidently identified and may be surgically absent. Regardless, there are no inflammatory changes noted adjacent to the cecum to suggest the presence of an acute appendicitis at this time. Vascular/Lymphatic: Aortic atherosclerosis, without evidence of aneurysm or dissection in the abdominal or pelvic vasculature. Circumaortic left renal vein (normal anatomical variant) noted. No lymphadenopathy noted in the abdomen or pelvis. Reproductive: Prostate gland and seminal vesicles are unremarkable in appearance. Other: No significant  volume of ascites.  No pneumoperitoneum. Musculoskeletal: There are no aggressive appearing lytic or blastic lesions noted in the visualized portions of the skeleton. IMPRESSION: 1. No acute findings are noted in the abdomen or pelvis to account for the patient's symptoms. 2. Moderate bilateral pleural effusions. Findings in the visualize lung bases most compatible with scarring from resolving COVID-19 infection. 3. Aortic atherosclerosis, in addition to at least 3 vessel coronary artery disease. 4. Mild colonic diverticulosis without evidence of acute diverticulitis at this time. 5. There are calcifications of the aortic valve. Echocardiographic correlation for evaluation of potential valvular dysfunction may be warranted if clinically indicated. 6. Additional incidental findings, as above. Electronically Signed   By: Vinnie Langton M.D.   On: 03/09/2019 14:00    ____________________________________________   PROCEDURES  Procedure(s) performed (including Critical Care):  .Critical Care Performed by: Vanessa [place], MD Authorized by: Vanessa Nelson, MD   Critical care provider statement:    Critical care time (minutes):  45   Critical care was necessary to treat or prevent imminent or life-threatening deterioration of the following conditions:  Sepsis   Critical care was time spent personally by me on the following activities:  Discussions with consultants, evaluation of patient's response to treatment, examination of patient, ordering and performing treatments and interventions, ordering and review of laboratory studies, ordering and review of radiographic studies, pulse oximetry, re-evaluation of patient's condition, obtaining history from patient or surrogate and review of old charts     ____________________________________________   INITIAL IMPRESSION / ASSESSMENT AND PLAN / ED COURSE  Jesus [name] was evaluated in Emergency Department on 02/19/2019 for the symptoms described in the  history of present illness. He was evaluated in the context of the global COVID-19 pandemic, which necessitated consideration that the patient might be at risk for infection with the SARS-CoV-2 virus that causes COVID-19. Institutional protocols and algorithms that pertain to the evaluation of patients at risk for COVID-19 are in a state of rapid change based on information released by regulatory bodies including the CDC and federal and state organizations. These policies and algorithms were followed during the patient's care in the ED.    Patient presents with increasing confusion, possible abdominal pain and does not acting his normal self.  Labs are concerning for the possibility of recurrent infection given his white count is back up to when he was bacteremic.  Given patient's white count elevation and elevated heart rate greater than 90 patient meets sepsis criteria.  Therefore will start on broad-spectrum antibiotics and give fluid resuscitation given patient slightly hypotensive.  CT head was without evidence of  intracranial hemorrhage.  CT abdomen without evidence of acute pathology such as SBO, appendicitis, cholecystitis.  Given patient then met sepsis criteria I did add on chest x-ray that was concerning for some pulmonary edema.  I reviewed patient's last echocardiogram which shows an EF of 50 to 55%.  Patient is getting 2 L of fluids but will need to closely monitor his respiratory status to ensure that he does get fluid overload.  He troponin did come back is elevated at 401.  I suspect is most likely demand in nature.  He does not have any ST elevation to require Cath Lab activation.  We should continue to trend this and potentially echocardiogram.  I did discuss the case with the son who does agree with admission for further work-up.  Patient per algorithm does not need repeat coronavirus testing.  I reevaluated patient after seen his chest x-ray that shows some worsening pulmonary edema.   Bedside echocardiogram does show a reduced EF.  Will wait on lactate to see if patient is hypoperfusing and consideration of this being cardiogenic shock? Currently covering with antibiotics to ensure to septic shock. Maps currently are 70.  I do not think patient requires pressors at this time however he will need to be closely monitored and may need initiation.  I discussed the case with Dr. Charna Archer and the hospitalist are aware of my concerns of above.  Will hold off on additional fluid per hospitalist request and continue to closely monitor patient. ____________________________________________   FINAL CLINICAL IMPRESSION(S) / ED DIAGNOSES   Final diagnoses:  Sepsis, due to unspecified organism, unspecified whether acute organ dysfunction present ([place])  Elevated troponin      MEDICATIONS GIVEN DURING THIS VISIT:  Medications  sodium chloride flush (NS) 0.9 % injection 3 mL (has no administration in time range)  ceFEPIme (MAXIPIME) 2 g in sodium chloride 0.9 % 100 mL IVPB (2 g Intravenous New Bag/Given 03/16/2019 1559)  metroNIDAZOLE (FLAGYL) IVPB 500 mg (has no administration in time range)  vancomycin (VANCOCIN) IVPB 1000 mg/200 mL premix (has no administration in time range)  ondansetron (ZOFRAN) injection 4 mg (4 mg Intravenous Given 02/18/2019 1240)  sodium chloride 0.9 % bolus 1,000 mL (1,000 mLs Intravenous New Bag/Given 03/02/2019 1421)  fentaNYL (SUBLIMAZE) injection 50 mcg (50 mcg Intravenous Given 03/16/2019 1323)  iohexol (OMNIPAQUE) 300 MG/ML solution 75 mL (75 mLs Intravenous Contrast Given 02/16/2019 1330)     ED Discharge Orders    None       Note:  This document was prepared using Dragon voice recognition software and may include unintentional dictation errors.   Vanessa [place], MD 03/05/2019 1603    Vanessa Mallard, MD 03/13/2019 [phone]

## 2019-03-17 NOTE — Consult Note (Signed)
CODE SEPSIS - PHARMACY COMMUNICATION  **Broad Spectrum Antibiotics should be administered within 1 hour of Sepsis diagnosis**  Time Code Sepsis Called/Page Received: 1451  Antibiotics Ordered: cefepime and vancomycin  Time of 1st antibiotic administration: 1559  Additional action taken by pharmacy: Messaged Rn   If necessary, Name of Provider/Nurse Contacted: Kimberly  Pt was a hard stick.     Oswald Hillock ,PharmD, BCPS Clinical Pharmacist  03/06/2019  4:07 PM

## 2019-03-17 NOTE — ED Triage Notes (Signed)
Pt via ems from home with abdominal pain; ems states he was seen for the same in early November. Family reported that pt's painb is in left side and radiates to right. Pt reports pain in all quadrants. Pt has hx of dementia, UTI, and had covid in October. Pt moans occasionally.

## 2019-03-18 LAB — URINE CULTURE: Culture: NO GROWTH

## 2019-03-18 DEATH — deceased

## 2019-03-21 MED FILL — Medication: Qty: 1 | Status: AC

## 2019-03-22 LAB — CULTURE, BLOOD (ROUTINE X 2)
Culture: NO GROWTH
Culture: NO GROWTH
Special Requests: ADEQUATE
Special Requests: ADEQUATE

## 2019-04-18 NOTE — ED Notes (Signed)
Pt shocked at 200 

## 2019-04-18 NOTE — ED Notes (Signed)
Calcium chloride given

## 2019-04-18 NOTE — ED Notes (Signed)
Pt given epi

## 2019-04-18 NOTE — ED Notes (Signed)
NP Ouma spoke with pts son , Pts son states pt is DNR

## 2019-04-18 NOTE — ED Notes (Signed)
CPR initiated

## 2019-04-18 NOTE — ED Notes (Signed)
Pt given bicarb

## 2019-04-18 NOTE — ED Notes (Signed)
Time of Death called 0100 by MD Karma Greaser

## 2019-04-18 NOTE — ED Provider Notes (Signed)
-----------------------------------------   1:06 AM on 2019-04-02 -----------------------------------------  It was brought to my attention that the patient had become unresponsive and that the previous ED physician, Dr. Charna Archer, was directing CPR as the patient's record reflected full CODE STATUS.  I entered the room and prepared to intubate but in the meantime the hospitalist NP arrived and verified in hospitalist's H&P (and then by calling the son/HCPOA) that the patient was actually supposed to be DNR/DNI.  And that point we ended resuscitative efforts and the time of death was 1:00am.  This patient was admitted to the hospitalist service but was still in the ED due to the hospital being full, so Ms. Ouma is handling the rest of the situation.   Hinda Kehr, MD 04/02/19 [phone]

## 2019-04-18 NOTE — Progress Notes (Signed)
Notified by ED physician that patient had become unresponsive and was coding in the ED. On arrival to the ED, he was being CODED by ED staff who believed he was a full code as the code status reflected on the chart as FULL. I quickly reviewed the chart and previous admitting physician's notes which indicated that he had spoken with the patient's son who is also the [facility] and confirmed that patient is DNR. I verified this information with the patient's son and patient's brother Heidel, [place] who is also listed as the legal Guardian. Patient's family has been notified that he passed away. They will not be coming in at this time but are planning to possible be here in the morning.   Rufina Falco, DNP, CCRN, FNP-C [facility] Nurse Practitioner Between 7pm to 7am - Pager [phone] Actively using [employer] secure chat messaging  After 7am go to [email or web address] - [email or web address]  [facility] [employer]  [phone]

## 2019-04-18 NOTE — ED Notes (Signed)
Epi given at this time.

## 2019-04-18 NOTE — Death Summary Note (Addendum)
DEATH SUMMARY   Patient Details  Name: Jesus [name] MRN: [id number] DOB: 27-Nov-1934  Admission/Discharge Information   Admit Date:  03-25-19  Date of Death: Date of Death: 03/26/2019  Time of Death: Time of Death: 0100  Length of Stay: 1  Referring Physician: Tracie Harrier, MD   Reason(s) for Hospitalization  Sepsis  Diagnoses  Preliminary cause of death: Sepsis ([place]) Secondary Diagnoses (including complications and co-morbidities):  Active Problems:   Sepsis [facility])   Brief Hospital Course (including significant findings, care, treatment, and services provided and events leading to death)  Jesus [name] is a 85 y.o. year old male with past medical history of dementia, type 2 diabetes mellitus hypertension bundle branch block and anemia of chronic disease, CAD on dual platelet antitherapy recently treated for COVID-19 infection with SARS-CoV-2 PCR positive on 01/22/2019, was admitted to [facility] for COVID-19 pneumonia and discharged in stable condition. He presented to the ED with c/o abdominal pain.Patient had recent admission back in early November for Klebsiella bacteremia due to Klebsiella UTI. He had a HIDA scan that was negative for cholecystitis  ED Course: On arrival to the ED, he was afebrile with blood pressure 109/68 mm Hg and pulse rate 101 beats/min. There were no focal neurological deficits; he was alert but confused. Initial labs revealed Lipase 19, Na+ 133, Glucose 318, Cr 1.35, WBC 13.4, Hgb 8.4, Troponin 401, BNP 1.889, Lactic acid 4.4>8.8>10.0. UAwith rare bacteria. Due to concerns for possible sepsis, patient was started on broad-spectrum antibiotics and given fluid resuscitation given patient slightly hypotensive.  CT head was without evidence of intracranial hemorrhage.  CT abdomen without evidence of acute pathology such as SBO, appendicitis, cholecystitis. chest x-ray that showed some worsening pulmonary edema. Patient was admitted under hospitalist service with plan as  follows:  Plan 1. Sepsis - Patient met SIRS criteria,(Tachycardia, tachypnea, hypothermia) - Suspect UTI as source of infection. Patient with recent COVID illness - Chest x-ray showed worsening Pulmonary edema - Lactic acid trended which was worsening with most recent value 10 - Urine cultures and blood cultures obtained which were still pending - Started Empiric abx with Cefepime and Vancomycin  2. Acute Kidney Injury - Likely prerenal secondary to hypotension /sepsis - Nephrotoxins were held with plan to continue to monitor renal functions  3. Elevated Troponin - Likely demand ischemia   - Echocardiogram was ordered  - Chest xray as above *- BNP elevated  4. Coronary Artery Disease - ASA 2m PO daily and Plavix was continued  5. HTN - Patient remained hypotensive - Blood pressure meds were held due to hypotension  6. DM:  - Home DM meds were held in the setting of AKI and Sepsis  7. Pleural effusion- on CT. Likely recent post Covid infection. - Plan was to consider thoracentesis in a day or 2 once his blood pressure is more stable  8. Dementia- Aricept was continued  Hospitalization course: Patient was noted with rectal temp of 94.7 therefore bear hugger was placed. Bp at 22:44 stable at 102/60, RR 25, repeat Lactic elevated. Patient became unresponsive shortly afterward, initially he was thought to be a full code, therefore CPR was initiated by ED staff. It was later confirmed that he was a DNR ant that point  resuscitative efforts were ended and the time of death was 1:00am   Pertinent Labs and Studies  Significant Diagnostic Studies Dg Chest 1 View  Result Date: 12020/12/08CLINICAL DATA:  Fever. EXAM: CHEST  1 VIEW COMPARISON:  CT chest  02/15/2019.  Chest x-ray 02/15/2019. FINDINGS: Cardiac pacer with lead tip over the right atrium right ventricle. Cardiomegaly. Bilateral pulmonary interstitial prominence, progressed from prior exam. Bibasilar atelectasis bilateral  pleural effusions. No pneumothorax. IMPRESSION: 1. Cardiac pacer with lead tips over the right atrium right ventricle. 2. Cardiomegaly with bilateral pulmonary interstitial prominence and bilateral pleural effusions consistent with CHF. Pneumonitis cannot be excluded. Bibasilar atelectasis. Findings have worsened from prior study. Electronically Signed   By: Marcello Moores  Register   On: 03/07/2019 15:26   Ct Head Wo Contrast  Result Date: 03/13/2019 CLINICAL DATA:  85 year old male with history of altered level of consciousness. EXAM: CT HEAD WITHOUT CONTRAST TECHNIQUE: Contiguous axial images were obtained from the base of the skull through the vertex without intravenous contrast. COMPARISON:  Head CT 02/15/2019. FINDINGS: Brain: Mild cerebral atrophy. Patchy and confluent areas of decreased attenuation are noted throughout the deep and periventricular white matter of the cerebral hemispheres bilaterally, compatible with chronic microvascular ischemic disease. Well-defined focus of low attenuation in the left basal ganglia, compatible with an old lacunar infarct. No evidence of acute infarction, hemorrhage, hydrocephalus, extra-axial collection or mass lesion/mass effect. Vascular: No hyperdense vessel or unexpected calcification. Skull: Normal. Negative for fracture or focal lesion. Sinuses/Orbits: No acute finding. Mucosal retention cyst or polyp in the medial aspect of the left maxillary sinus measuring 2.1 x 1.3 cm. Mucosal thickening in the left posterior ethmoid sinuses. Other: None. IMPRESSION: 1. No acute intracranial abnormalities. 2. 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 Electronically Signed   By: Vinnie Langton M.D.   On: 02/16/2019 13:46   Nm Hepatobiliary Liver Func  Result Date: 02/16/2019 CLINICAL DATA:  Altered mental status. Sludge, gallbladder distention, gallbladder wall thickening  seen on an ultrasound from today. Cholecystitis suspected. EXAM: NUCLEAR MEDICINE HEPATOBILIARY IMAGING TECHNIQUE: Sequential images of the abdomen were obtained out to 60 minutes following intravenous administration of radiopharmaceutical. RADIOPHARMACEUTICALS:  5.44 mCi Tc-85m Choletec IV COMPARISON:  CT scan February 15, 2019.  Ultrasound February 16, 2019. FINDINGS: There is prompt uptake and washout from the liver. The gallbladder begins to fill at the end of the first hour of imaging. Filling significantly increases after morphine administration. There is normal excretion into the bowel. No other abnormalities. IMPRESSION: The gallbladder fills on this study excluding cystic duct obstruction. Electronically Signed   By: DDorise BullionIII M.D   On: 02/16/2019 15:23   Ct Abdomen Pelvis W Contrast  Result Date: 02/23/2019 CLINICAL DATA:  85year old male with history of acute generalized abdominal pain. EXAM: CT ABDOMEN AND PELVIS WITH CONTRAST TECHNIQUE: Multidetector CT imaging of the abdomen and pelvis was performed using the standard protocol following bolus administration of intravenous contrast. CONTRAST:  734mOMNIPAQUE IOHEXOL 300 MG/ML  SOLN COMPARISON:  CT the abdomen and pelvis 02/15/2019. FINDINGS: Lower chest: Moderate bilateral pleural effusions lying dependently. Mild scarring in the lung bases bilaterally, likely secondary to resolving COVID-19 infection. Aortic atherosclerosis. Calcified atherosclerotic plaque in the left anterior descending, left circumflex and right coronary arteries. Mild calcifications of the aortic valve. Pacemaker leads in the right atrium and right ventricle. Hepatobiliary: No suspicious cystic or solid hepatic lesions. No intra or extrahepatic biliary ductal dilatation. Gallbladder is normal in appearance. Pancreas: No pancreatic mass. No pancreatic ductal dilatation. No pancreatic or peripancreatic fluid collections or inflammatory changes. Spleen: Unremarkable.  Adrenals/Urinary Tract: Bilateral kidneys and adrenal glands are normal in appearance. No hydroureteronephrosis.  Urinary bladder is normal in appearance. Stomach/Bowel: Normal appearance of the stomach. No pathologic dilatation of small bowel or colon. A few scattered colonic diverticulae are noted, without surrounding inflammatory changes to suggest an acute diverticulitis at this time. The appendix is not confidently identified and may be surgically absent. Regardless, there are no inflammatory changes noted adjacent to the cecum to suggest the presence of an acute appendicitis at this time. Vascular/Lymphatic: Aortic atherosclerosis, without evidence of aneurysm or dissection in the abdominal or pelvic vasculature. Circumaortic left renal vein (normal anatomical variant) noted. No lymphadenopathy noted in the abdomen or pelvis. Reproductive: Prostate gland and seminal vesicles are unremarkable in appearance. Other: No significant volume of ascites.  No pneumoperitoneum. Musculoskeletal: There are no aggressive appearing lytic or blastic lesions noted in the visualized portions of the skeleton. IMPRESSION: 1. No acute findings are noted in the abdomen or pelvis to account for the patient's symptoms. 2. Moderate bilateral pleural effusions. Findings in the visualize lung bases most compatible with scarring from resolving COVID-19 infection. 3. Aortic atherosclerosis, in addition to at least 3 vessel coronary artery disease. 4. Mild colonic diverticulosis without evidence of acute diverticulitis at this time. 5. There are calcifications of the aortic valve. Echocardiographic correlation for evaluation of potential valvular dysfunction may be warranted if clinically indicated. 6. Additional incidental findings, as above. Electronically Signed   By: Vinnie Langton M.D.   On: 03/13/2019 14:00    Microbiology No results found for this or any previous visit (from the past 240 hour(s)).  Lab Basic Metabolic  Panel: Recent Labs  Lab 02/16/2019 1200  NA 133*  K 4.1  CL 100  CO2 19*  GLUCOSE 318*  BUN 19  CREATININE 1.35*  CALCIUM 8.4*   Liver Function Tests: Recent Labs  Lab 03/05/2019 1200  AST 18  ALT 16  ALKPHOS 84  BILITOT 0.9  PROT 6.6  ALBUMIN 2.9*   Recent Labs  Lab 03/04/2019 1200  LIPASE 19   No results for input(s): AMMONIA in the last 168 hours. CBC: Recent Labs  Lab 03/07/2019 1200  WBC 13.4*  HGB 8.4*  HCT 26.5*  MCV 92.0  PLT 310   Cardiac Enzymes: No results for input(s): CKTOTAL, CKMB, CKMBINDEX, TROPONINI in the last 168 hours. Sepsis Labs: Recent Labs  Lab 02/22/2019 1200 03/15/2019 1559 03/08/2019 1743 02/16/2019 2049  WBC 13.4*  --   --   --   LATICACIDVEN  --  4.4* 8.8* 10.0*    Procedures/Operations    SIGNED:  Rufina Falco, DNP, CCRN, FNP-C [facility] Nurse Practitioner Between 7pm to 7am - Pager [phone] Actively using [employer] secure chat messaging  After 7am go to [email or web address] - [email or web address]  [facility] [employer]  [phone]

## 2019-04-18 NOTE — ED Notes (Signed)
Pulse check, PEA 

## 2019-04-18 DEATH — deceased

## 2021-09-28 IMAGING — CT CT ABD-PELV W/ CM
2 of 5 series · 15 of 46 positions shown, 17 images · IV contrast (APPLIED)
Comparison: CT the abdomen and pelvis 02/15/2019.

CLINICAL DATA: 84-year-old male with history of acute generalized
abdominal pain.

EXAM:
CT ABDOMEN AND PELVIS WITH CONTRAST
TECHNIQUE: Multidetector CT imaging of the abdomen and pelvis was performed
using the standard protocol following bolus administration of
intravenous contrast.
CONTRAST:  75mL OMNIPAQUE IOHEXOL 300 MG/ML  SOLN

[Series 3: routine abd/pel with · axial · 0.78mm/px · z∈[-929,-504]mm · 12 of 96 slices shown, 14 images]
[im 6/96  soft-tissue]
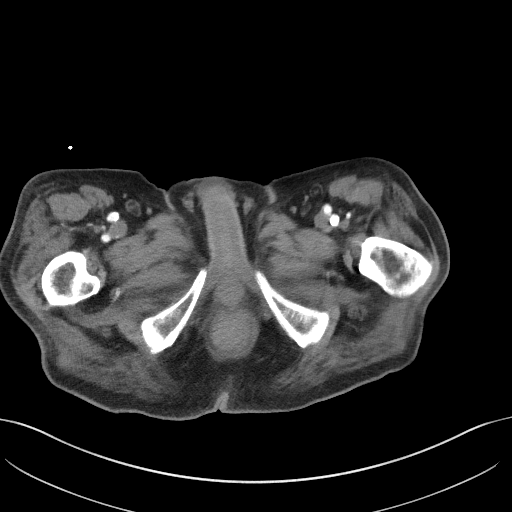
[im 6/96  bone]
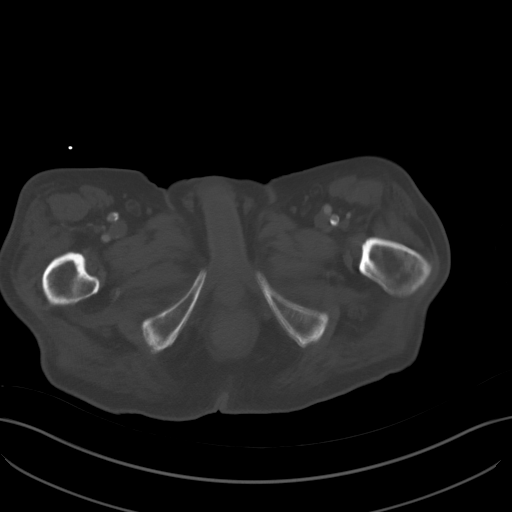
[im 16/96  soft-tissue]
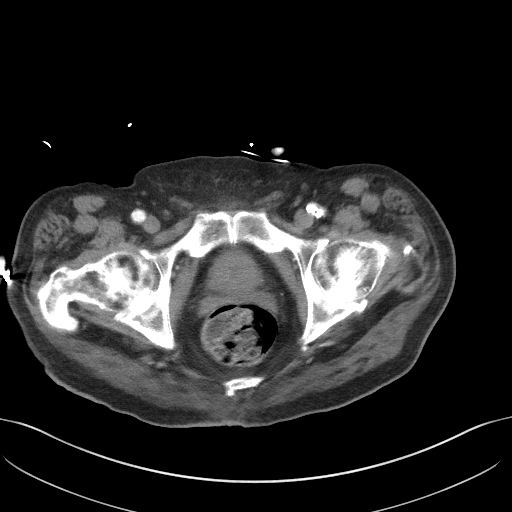
[im 21/96  soft-tissue]
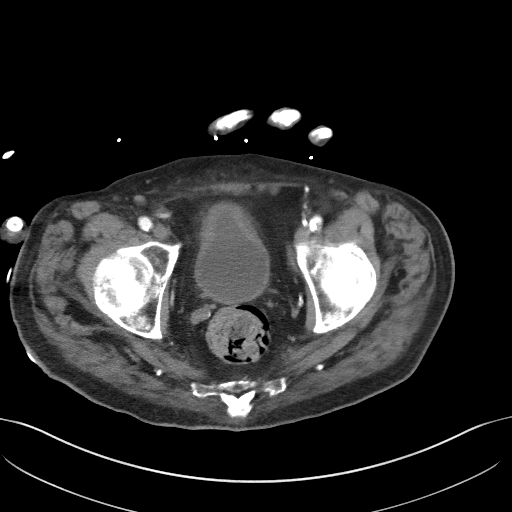
[im 31/96  soft-tissue]
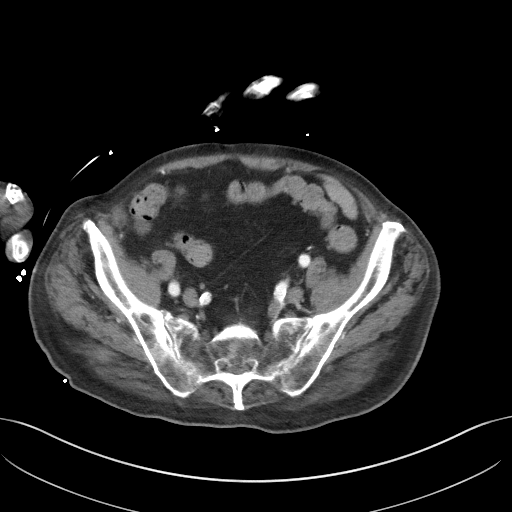
[im 36/96  soft-tissue]
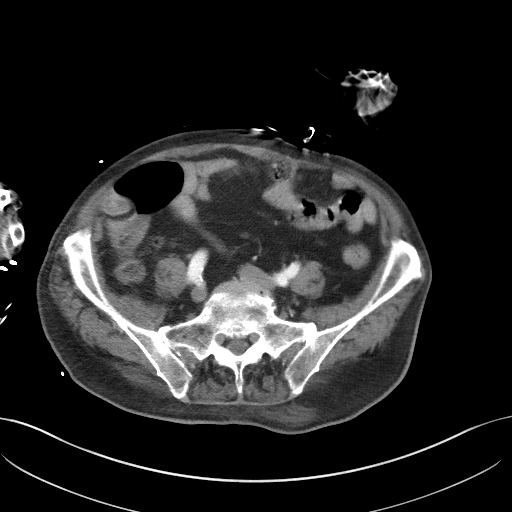
[im 46/96  soft-tissue]
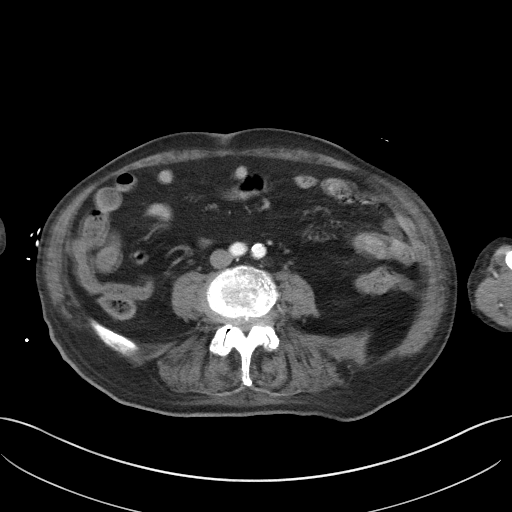
[im 51/96  soft-tissue]
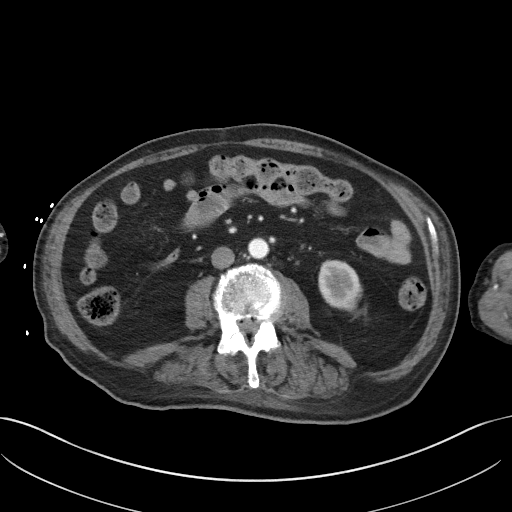
[im 61/96  soft-tissue]
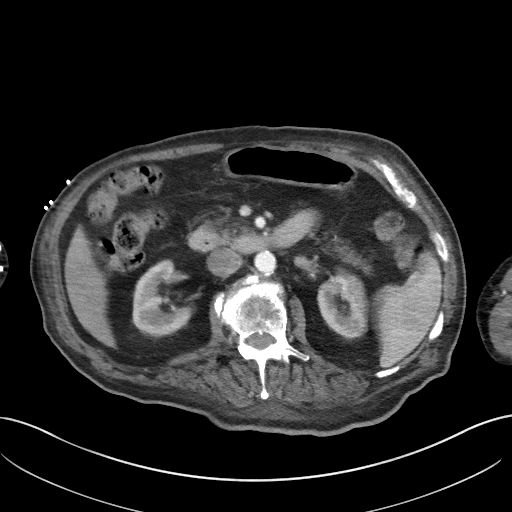
[im 66/96  soft-tissue]
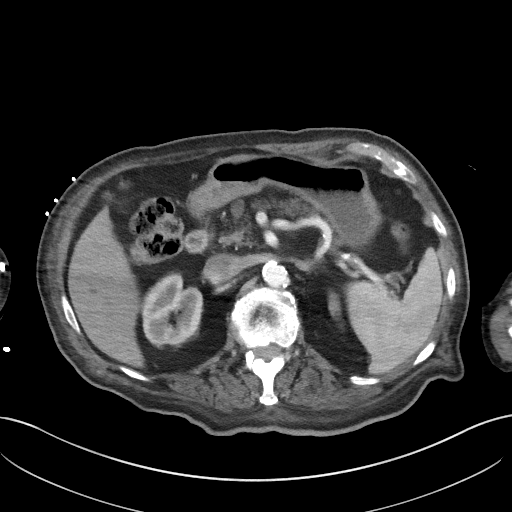
[im 66/96  bone]
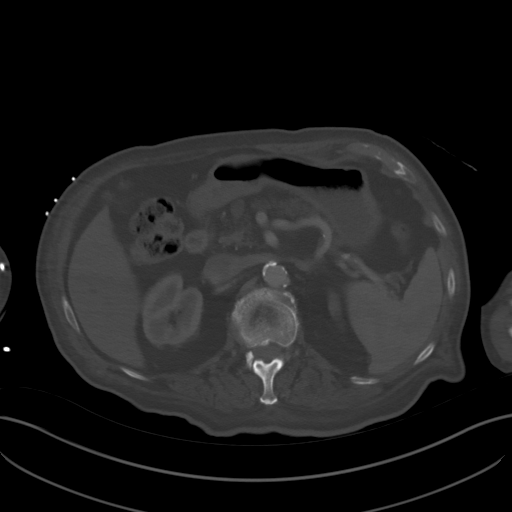
[im 76/96  soft-tissue]
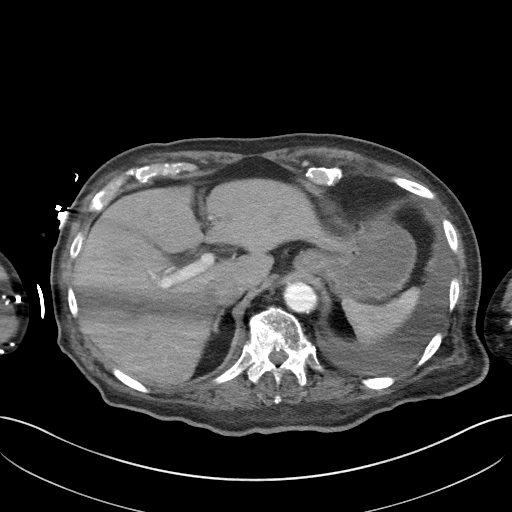
[im 81/96  soft-tissue]
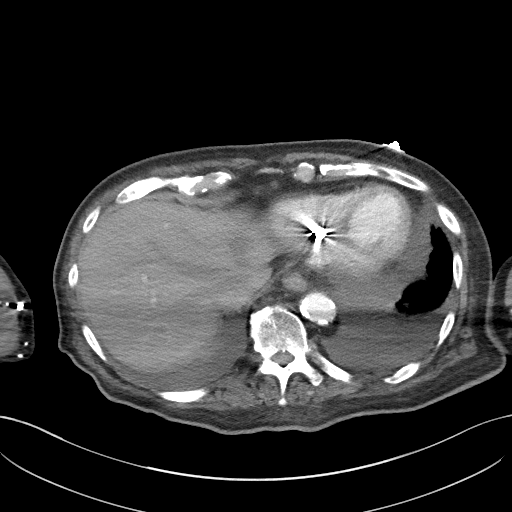
[im 91/96  soft-tissue]
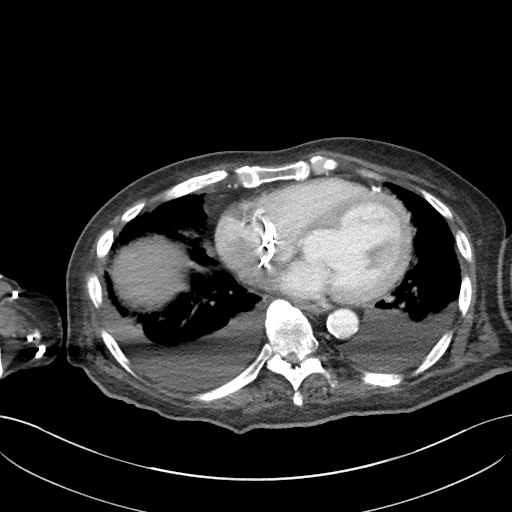

[Series 6: coronal st · coronal · 0.71mm/px · 3 of 83 slices shown]
[im 28/83  soft-tissue]
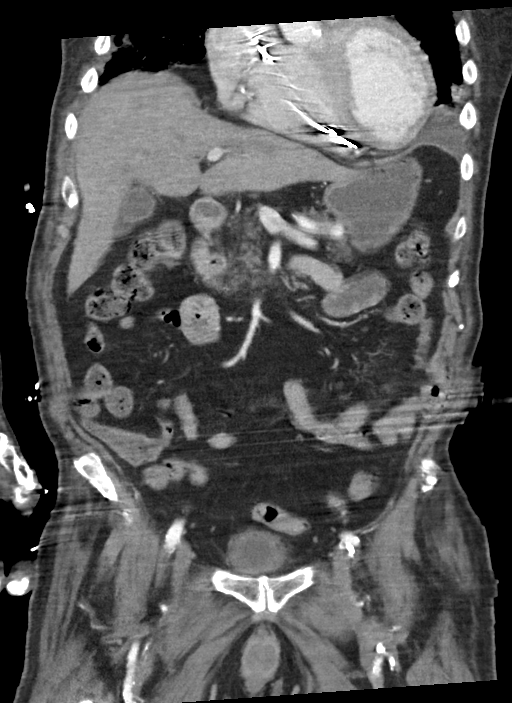
[im 37/83  soft-tissue]
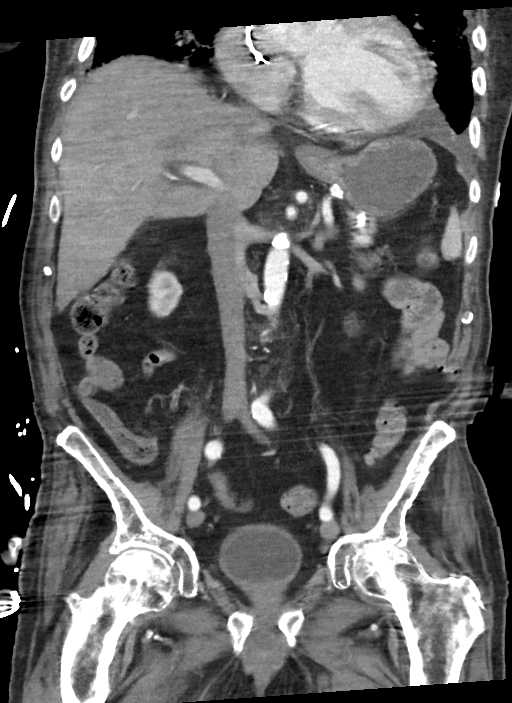
[im 46/83  soft-tissue]
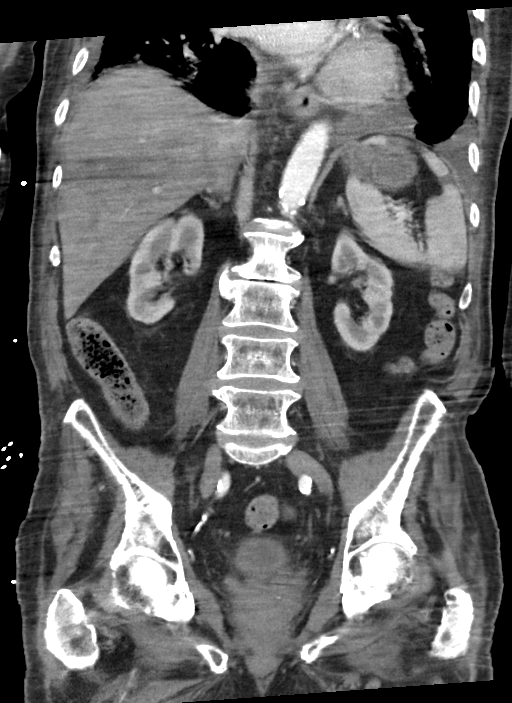

[15 of 46 positions shown; findings below may reference images not displayed]

FINDINGS: Lower chest: Moderate bilateral pleural effusions lying dependently.
Mild scarring in the lung bases bilaterally, likely secondary to
resolving GOB87-8I infection. Aortic atherosclerosis. Calcified
atherosclerotic plaque in the left anterior descending, left
circumflex and right coronary arteries. Mild calcifications of the
aortic valve. Pacemaker leads in the right atrium and right
ventricle.

Hepatobiliary: No suspicious cystic or solid hepatic lesions. No
intra or extrahepatic biliary ductal dilatation. Gallbladder is
normal in appearance.

Pancreas: No pancreatic mass. No pancreatic ductal dilatation. No
pancreatic or peripancreatic fluid collections or inflammatory
changes.

Spleen: Unremarkable.

Adrenals/Urinary Tract: Bilateral kidneys and adrenal glands are
normal in appearance. No hydroureteronephrosis. Urinary bladder is
normal in appearance.

Stomach/Bowel: Normal appearance of the stomach. No pathologic
dilatation of small bowel or colon. A few scattered colonic
diverticulae are noted, without surrounding inflammatory changes to
suggest an acute diverticulitis at this time. The appendix is not
confidently identified and may be surgically absent. Regardless,
there are no inflammatory changes noted adjacent to the cecum to
suggest the presence of an acute appendicitis at this time.

Vascular/Lymphatic: Aortic atherosclerosis, without evidence of
aneurysm or dissection in the abdominal or pelvic vasculature.
Circumaortic left renal vein (normal anatomical variant) noted. No
lymphadenopathy noted in the abdomen or pelvis.

Reproductive: Prostate gland and seminal vesicles are unremarkable
in appearance.

Other: No significant volume of ascites.  No pneumoperitoneum.

Musculoskeletal: There are no aggressive appearing lytic or blastic
lesions noted in the visualized portions of the skeleton.
IMPRESSION: 1. No acute findings are noted in the abdomen or pelvis to account
for the patient's symptoms.
2. Moderate bilateral pleural effusions. Findings in the visualize
lung bases most compatible with scarring from resolving GOB87-8I
infection.
3. Aortic atherosclerosis, in addition to at least 3 vessel coronary
artery disease.
4. Mild colonic diverticulosis without evidence of acute
diverticulitis at this time.
5. There are calcifications of the aortic valve. Echocardiographic
correlation for evaluation of potential valvular dysfunction may be
warranted if clinically indicated.
6. Additional incidental findings, as above.
